# Patient Record
Sex: Female | Born: 1970 | Race: Black or African American | Hispanic: No | Smoking: Never smoker
Health system: Southern US, Community
[De-identification: ages and names within clinical notes are randomized; demographics above are authoritative.]

## PROBLEM LIST (undated history)

## (undated) DIAGNOSIS — Z789 Other specified health status: Secondary | ICD-10-CM

## (undated) DIAGNOSIS — R002 Palpitations: Secondary | ICD-10-CM

## (undated) DIAGNOSIS — I82409 Acute embolism and thrombosis of unspecified deep veins of unspecified lower extremity: Secondary | ICD-10-CM

## (undated) DIAGNOSIS — F32A Depression, unspecified: Secondary | ICD-10-CM

## (undated) HISTORY — DX: Acute embolism and thrombosis of unspecified deep veins of unspecified lower extremity: I82.409

## (undated) HISTORY — DX: Depression, unspecified: F32.A

## (undated) HISTORY — DX: Palpitations: R00.2

---

## 2001-12-03 ENCOUNTER — Emergency Department (HOSPITAL_COMMUNITY): Admission: EM | Admit: 2001-12-03 | Discharge: 2001-12-03 | Payer: Self-pay | Admitting: Emergency Medicine

## 2002-03-12 ENCOUNTER — Other Ambulatory Visit: Admission: RE | Admit: 2002-03-12 | Discharge: 2002-03-12 | Payer: Self-pay | Admitting: Obstetrics and Gynecology

## 2003-05-25 ENCOUNTER — Other Ambulatory Visit: Admission: RE | Admit: 2003-05-25 | Discharge: 2003-05-25 | Payer: Self-pay | Admitting: Obstetrics and Gynecology

## 2004-09-11 ENCOUNTER — Other Ambulatory Visit: Admission: RE | Admit: 2004-09-11 | Discharge: 2004-09-11 | Payer: Self-pay | Admitting: Obstetrics and Gynecology

## 2005-04-09 ENCOUNTER — Inpatient Hospital Stay (HOSPITAL_COMMUNITY): Admission: AD | Admit: 2005-04-09 | Discharge: 2005-04-12 | Payer: Self-pay | Admitting: *Deleted

## 2005-05-23 ENCOUNTER — Encounter: Admission: RE | Admit: 2005-05-23 | Discharge: 2005-06-22 | Payer: Self-pay | Admitting: Obstetrics and Gynecology

## 2005-06-05 ENCOUNTER — Other Ambulatory Visit: Admission: RE | Admit: 2005-06-05 | Discharge: 2005-06-05 | Payer: Self-pay | Admitting: Obstetrics and Gynecology

## 2005-06-23 ENCOUNTER — Encounter: Admission: RE | Admit: 2005-06-23 | Discharge: 2005-07-20 | Payer: Self-pay | Admitting: Obstetrics and Gynecology

## 2005-07-21 ENCOUNTER — Encounter: Admission: RE | Admit: 2005-07-21 | Discharge: 2005-08-20 | Payer: Self-pay | Admitting: Obstetrics and Gynecology

## 2005-08-02 ENCOUNTER — Ambulatory Visit: Payer: Self-pay | Admitting: Internal Medicine

## 2005-08-21 ENCOUNTER — Encounter: Admission: RE | Admit: 2005-08-21 | Discharge: 2005-09-19 | Payer: Self-pay | Admitting: Obstetrics and Gynecology

## 2005-09-20 ENCOUNTER — Encounter: Admission: RE | Admit: 2005-09-20 | Discharge: 2005-10-20 | Payer: Self-pay | Admitting: Obstetrics and Gynecology

## 2005-10-21 ENCOUNTER — Encounter: Admission: RE | Admit: 2005-10-21 | Discharge: 2005-11-19 | Payer: Self-pay | Admitting: Obstetrics and Gynecology

## 2005-11-20 ENCOUNTER — Encounter: Admission: RE | Admit: 2005-11-20 | Discharge: 2005-12-20 | Payer: Self-pay | Admitting: Obstetrics and Gynecology

## 2005-12-21 ENCOUNTER — Encounter: Admission: RE | Admit: 2005-12-21 | Discharge: 2006-01-20 | Payer: Self-pay | Admitting: Obstetrics and Gynecology

## 2006-01-21 ENCOUNTER — Encounter: Admission: RE | Admit: 2006-01-21 | Discharge: 2006-02-19 | Payer: Self-pay | Admitting: Obstetrics and Gynecology

## 2006-02-20 ENCOUNTER — Encounter: Admission: RE | Admit: 2006-02-20 | Discharge: 2006-03-22 | Payer: Self-pay | Admitting: Obstetrics and Gynecology

## 2006-03-23 ENCOUNTER — Encounter: Admission: RE | Admit: 2006-03-23 | Discharge: 2006-04-21 | Payer: Self-pay | Admitting: Obstetrics and Gynecology

## 2006-04-22 ENCOUNTER — Encounter: Admission: RE | Admit: 2006-04-22 | Discharge: 2006-05-22 | Payer: Self-pay | Admitting: Obstetrics and Gynecology

## 2006-05-23 ENCOUNTER — Encounter: Admission: RE | Admit: 2006-05-23 | Discharge: 2006-06-22 | Payer: Self-pay | Admitting: Obstetrics and Gynecology

## 2006-06-20 ENCOUNTER — Inpatient Hospital Stay (HOSPITAL_COMMUNITY): Admission: EM | Admit: 2006-06-20 | Discharge: 2006-06-21 | Payer: Self-pay | Admitting: Emergency Medicine

## 2006-06-20 ENCOUNTER — Ambulatory Visit: Payer: Self-pay | Admitting: Cardiology

## 2006-06-23 ENCOUNTER — Encounter: Admission: RE | Admit: 2006-06-23 | Discharge: 2006-07-21 | Payer: Self-pay | Admitting: Obstetrics and Gynecology

## 2006-06-24 ENCOUNTER — Ambulatory Visit: Payer: Self-pay | Admitting: Internal Medicine

## 2006-07-03 ENCOUNTER — Ambulatory Visit: Payer: Self-pay

## 2006-07-11 ENCOUNTER — Ambulatory Visit: Payer: Self-pay

## 2006-07-15 ENCOUNTER — Ambulatory Visit: Payer: Self-pay | Admitting: Cardiology

## 2006-07-22 ENCOUNTER — Encounter: Admission: RE | Admit: 2006-07-22 | Discharge: 2006-08-21 | Payer: Self-pay | Admitting: Obstetrics and Gynecology

## 2006-08-22 ENCOUNTER — Encounter: Admission: RE | Admit: 2006-08-22 | Discharge: 2006-09-20 | Payer: Self-pay | Admitting: Obstetrics and Gynecology

## 2006-09-21 ENCOUNTER — Encounter: Admission: RE | Admit: 2006-09-21 | Discharge: 2006-10-21 | Payer: Self-pay | Admitting: Obstetrics and Gynecology

## 2007-05-12 ENCOUNTER — Telehealth: Payer: Self-pay | Admitting: Internal Medicine

## 2007-10-13 ENCOUNTER — Encounter: Payer: Self-pay | Admitting: *Deleted

## 2007-10-13 DIAGNOSIS — R5383 Other fatigue: Secondary | ICD-10-CM

## 2007-10-13 DIAGNOSIS — J329 Chronic sinusitis, unspecified: Secondary | ICD-10-CM | POA: Insufficient documentation

## 2007-10-13 DIAGNOSIS — Z862 Personal history of diseases of the blood and blood-forming organs and certain disorders involving the immune mechanism: Secondary | ICD-10-CM | POA: Insufficient documentation

## 2007-10-13 DIAGNOSIS — J309 Allergic rhinitis, unspecified: Secondary | ICD-10-CM | POA: Insufficient documentation

## 2007-10-13 DIAGNOSIS — R5381 Other malaise: Secondary | ICD-10-CM | POA: Insufficient documentation

## 2007-11-16 ENCOUNTER — Ambulatory Visit: Payer: Self-pay | Admitting: Internal Medicine

## 2007-11-16 DIAGNOSIS — M25519 Pain in unspecified shoulder: Secondary | ICD-10-CM | POA: Insufficient documentation

## 2008-05-30 ENCOUNTER — Encounter: Payer: Self-pay | Admitting: Internal Medicine

## 2008-09-21 ENCOUNTER — Ambulatory Visit: Payer: Self-pay | Admitting: Internal Medicine

## 2008-09-21 DIAGNOSIS — F410 Panic disorder [episodic paroxysmal anxiety] without agoraphobia: Secondary | ICD-10-CM | POA: Insufficient documentation

## 2008-09-21 DIAGNOSIS — F32A Depression, unspecified: Secondary | ICD-10-CM | POA: Insufficient documentation

## 2008-09-21 DIAGNOSIS — G4700 Insomnia, unspecified: Secondary | ICD-10-CM | POA: Insufficient documentation

## 2008-09-21 DIAGNOSIS — F341 Dysthymic disorder: Secondary | ICD-10-CM | POA: Insufficient documentation

## 2008-09-21 LAB — CONVERTED CEMR LAB: Vit D, 25-Hydroxy: 45 ng/mL (ref 30–89)

## 2008-09-26 LAB — CONVERTED CEMR LAB
AST: 19 units/L (ref 0–37)
Albumin: 3.8 g/dL (ref 3.5–5.2)
Alkaline Phosphatase: 37 units/L — ABNORMAL LOW (ref 39–117)
BUN: 12 mg/dL (ref 6–23)
Basophils Absolute: 0 10*3/uL (ref 0.0–0.1)
Basophils Relative: 0.5 % (ref 0.0–3.0)
Calcium: 9.3 mg/dL (ref 8.4–10.5)
Chloride: 105 meq/L (ref 96–112)
Creatinine, Ser: 0.6 mg/dL (ref 0.4–1.2)
Eosinophils Absolute: 0.2 10*3/uL (ref 0.0–0.7)
GFR calc non Af Amer: 144.77 mL/min (ref >60)
Lymphocytes Relative: 28.2 % (ref 12.0–46.0)
Lymphs Abs: 2 10*3/uL (ref 0.7–4.0)
MCV: 91 fL (ref 78.0–100.0)
Monocytes Absolute: 0.4 10*3/uL (ref 0.1–1.0)
Monocytes Relative: 5.5 % (ref 3.0–12.0)
Neutrophils Relative %: 63.1 % (ref 43.0–77.0)
Nitrite: NEGATIVE
Potassium: 4 meq/L (ref 3.5–5.1)
RBC: 4.09 M/uL (ref 3.87–5.11)
Sodium: 139 meq/L (ref 135–145)
TSH: 1.46 microintl units/mL (ref 0.35–5.50)
Total Protein: 8.1 g/dL (ref 6.0–8.3)
Urine Glucose: NEGATIVE mg/dL
Urobilinogen, UA: 0.2 (ref 0.0–1.0)
Vitamin B-12: 848 pg/mL (ref 211–911)

## 2008-09-27 ENCOUNTER — Telehealth: Payer: Self-pay | Admitting: Internal Medicine

## 2008-11-07 ENCOUNTER — Ambulatory Visit: Payer: Self-pay | Admitting: Internal Medicine

## 2008-11-09 ENCOUNTER — Telehealth: Payer: Self-pay | Admitting: Internal Medicine

## 2008-11-15 ENCOUNTER — Encounter: Payer: Self-pay | Admitting: Internal Medicine

## 2008-11-17 ENCOUNTER — Telehealth (INDEPENDENT_AMBULATORY_CARE_PROVIDER_SITE_OTHER): Payer: Self-pay | Admitting: *Deleted

## 2008-11-18 ENCOUNTER — Ambulatory Visit: Payer: Self-pay | Admitting: Internal Medicine

## 2008-11-20 DIAGNOSIS — F411 Generalized anxiety disorder: Secondary | ICD-10-CM

## 2008-11-20 DIAGNOSIS — F410 Panic disorder [episodic paroxysmal anxiety] without agoraphobia: Secondary | ICD-10-CM | POA: Insufficient documentation

## 2008-11-22 ENCOUNTER — Encounter: Admission: RE | Admit: 2008-11-22 | Discharge: 2008-11-22 | Payer: Self-pay | Admitting: Obstetrics and Gynecology

## 2008-11-23 ENCOUNTER — Encounter: Payer: Self-pay | Admitting: Internal Medicine

## 2008-11-29 ENCOUNTER — Telehealth: Payer: Self-pay | Admitting: Internal Medicine

## 2008-11-29 ENCOUNTER — Encounter: Payer: Self-pay | Admitting: Internal Medicine

## 2008-11-30 ENCOUNTER — Telehealth: Payer: Self-pay | Admitting: Internal Medicine

## 2008-12-02 ENCOUNTER — Encounter: Payer: Self-pay | Admitting: Internal Medicine

## 2008-12-16 ENCOUNTER — Ambulatory Visit: Payer: Self-pay | Admitting: Internal Medicine

## 2009-06-23 ENCOUNTER — Telehealth: Payer: Self-pay | Admitting: Internal Medicine

## 2009-08-18 ENCOUNTER — Ambulatory Visit: Payer: Self-pay | Admitting: Internal Medicine

## 2009-08-18 LAB — CONVERTED CEMR LAB
Alkaline Phosphatase: 31 units/L — ABNORMAL LOW (ref 39–117)
Basophils Absolute: 0 10*3/uL (ref 0.0–0.1)
Cholesterol: 157 mg/dL (ref 0–200)
Eosinophils Absolute: 0.1 10*3/uL (ref 0.0–0.7)
Eosinophils Relative: 1.6 % (ref 0.0–5.0)
HCT: 35.8 % — ABNORMAL LOW (ref 36.0–46.0)
Hemoglobin: 12.5 g/dL (ref 12.0–15.0)
LDL Cholesterol: 76 mg/dL (ref 0–99)
Lymphocytes Relative: 23.8 % (ref 12.0–46.0)
Lymphs Abs: 1.8 10*3/uL (ref 0.7–4.0)
MCHC: 35 g/dL (ref 30.0–36.0)
MCV: 90.1 fL (ref 78.0–100.0)
Monocytes Absolute: 0.4 10*3/uL (ref 0.1–1.0)
Monocytes Relative: 4.7 % (ref 3.0–12.0)
Neutro Abs: 5.2 10*3/uL (ref 1.4–7.7)
Neutrophils Relative %: 69.6 % (ref 43.0–77.0)
Potassium: 3.8 meq/L (ref 3.5–5.1)
RDW: 12.4 % (ref 11.5–14.6)
TSH: 1.01 microintl units/mL (ref 0.35–5.50)
Total CHOL/HDL Ratio: 3
Total Protein: 8.5 g/dL — ABNORMAL HIGH (ref 6.0–8.3)
WBC: 7.4 10*3/uL (ref 4.5–10.5)

## 2009-08-25 LAB — CONVERTED CEMR LAB
Mumps IgG: 1.57 — ABNORMAL HIGH
Rubeola IgG: 1.6 — ABNORMAL HIGH

## 2009-08-30 ENCOUNTER — Telehealth: Payer: Self-pay | Admitting: Internal Medicine

## 2009-09-05 ENCOUNTER — Ambulatory Visit: Payer: Self-pay | Admitting: Internal Medicine

## 2009-09-19 ENCOUNTER — Ambulatory Visit: Payer: Self-pay | Admitting: Internal Medicine

## 2009-09-21 ENCOUNTER — Encounter: Payer: Self-pay | Admitting: Internal Medicine

## 2009-09-27 ENCOUNTER — Telehealth: Payer: Self-pay | Admitting: Internal Medicine

## 2009-09-27 ENCOUNTER — Encounter: Payer: Self-pay | Admitting: Internal Medicine

## 2010-06-12 NOTE — Assessment & Plan Note (Signed)
Summary: tb skin test per kelly/plot/cd --coming at 8am  Nurse Visit   Allergies: No Known Drug Allergies  Immunizations Administered:  PPD Skin Test:    Vaccine Type: PPD    Site: right forearm    Mfr: Sanofi Pasteur    Dose: 0.1 ml    Route: ID    Given by: Brenton Grills    Exp. Date: 09/06/2011    Lot #: [id number]  PPD Results    Date of reading: 09/07/2009    Results: < 5mm    Interpretation: negative  Orders Added: 1)  TB Skin Test [86580] 2)  Admin 1st Vaccine [82956]

## 2010-06-12 NOTE — Assessment & Plan Note (Signed)
Summary: NEEDS VACCINES FOR SCHOOL/NWS   Vital Signs:  Patient profile:   40 year old female Height:      63 inches Weight:      187 pounds BMI:     33.25 O2 Sat:      98 % on Room air Temp:     98.0 degrees F oral Pulse rate:   70 / minute BP sitting:   110 / 68  (left arm) Cuff size:   regular  Vitals Entered By: Lucious Groves (August 18, 2009 8:04 AM)  O2 Flow:  Room air CC: Needs vaccines for nursing school./kb Is Patient Diabetic? No Pain Assessment Patient in pain? no        CC:  Needs vaccines for nursing school./kb.  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 1 Once Daily 2)  Nasacort Aq 55 Mcg/act  Aers (Triamcinolone Acetonide(Nasal)) .... Use As Directed. 3)  Vitamin D3 1000 Unit  Tabs (Cholecalciferol) .Marland Kitchen.. 1 By Mouth Daily 4)  Loratadine-D 24hr 10-240 Mg Xr24h-Tab (Loratadine-Pseudoephedrine) .Marland Kitchen.. 1 By Mouth Once Daily in Am As Needed Allergy 5)  Yaz 3-0.02 Mg Tabs (Drospirenone-Ethinyl Estradiol) .... One Qd  Allergies (verified): No Known Drug Allergies  Past History:  Past Medical History: Last updated: 11/18/2008 Hx of SINUSITIS (ICD-473.9) PALPITATIONS, HX OF (ICD-V12.50) CARPAL TUNNEL SYNDROME, RIGHT (ICD-354.0) ANEMIA, HX OF (ICD-V12.3) Hx of DEPRESSION (ICD-311) Hx of FATIGUE (ICD-780.79) ALLERGIC RHINITIS (ICD-477.9)   Anxiety Depression  Family History: Last updated: 09/21/2008 No depression  Social History: Last updated: 09/21/2008 Occupation: office Married with 3 children, husband w/bipolar depression Never Smoked Alcohol use-no Drug use-no  Past Surgical History: Denies surgical history  Review of Systems  The patient denies anorexia, fever, weight loss, weight gain, vision loss, decreased hearing, hoarseness, chest pain,  syncope, dyspnea on exertion, peripheral edema, prolonged cough, headaches, hemoptysis, abdominal pain, melena, hematochezia, severe indigestion/heartburn, hematuria, incontinence, genital sores, muscle weakness, suspicious skin lesions, transient blindness, difficulty walking, depression, unusual weight change, abnormal bleeding, enlarged lymph nodes, angioedema, and breast masses.    Physical Exam  General:  Well-developed,well-nourished Head:  Normocephalic and atraumatic without obvious abnormalities. No apparent alopecia or balding. Eyes:  No corneal or conjunctival inflammation noted. EOMI. Perrla.  Nose:  External nasal examination shows no deformity or inflammation. Nasal mucosa are pink and moist without lesions or exudates. Mouth:  Oral mucosa and oropharynx without lesions or exudates.  Teeth in good repair. Lungs:  Normal respiratory effort, chest expands symmetrically. Lungs are clear to auscultation, no crackles or wheezes. Heart:  Normal rate and regular rhythm. S1 and S2 normal without gallop, murmur, click, rub or other extra sounds. Abdomen:  Bowel sounds positive,abdomen soft and non-tender without masses, organomegaly or hernias noted. Msk:  No deformity or scoliosis noted of thoracic or lumbar spine.   Neurologic:  No cranial nerve deficits noted. Station and gait are normal. Plantar reflexes are down-going bilaterally. DTRs are symmetrical throughout. Sensory, motor and coordinative functions appear intact. Skin:  Intact without suspicious lesions or rashes Psych:  Cognition and judgment appear intact. Alert and cooperative with normal attention span and concentration. No apparent delusions, illusions, hallucinations Oriented X3, depressed affect,  slightly anxious.  not suicidal.     Impression & Recommendations:  Problem # 1:  PHYSICAL EXAMINATION (ICD-V70.0) Assessment New Health and age  related issues were discussed. Available screening tests and vaccinations were  discussed as well. Healthy life style including good diet and execise was discussed. GYN q 12 months. PPD will be placed. She seems to be in a good shape. Orders: T-Varicella-Zoster Antibody [id number]) T-Measles (Rubeola) Antibody IgG ([id number]) T-Mumps Virus Antibody, IgG (72536-64403) TLB-Udip ONLY (81003-UDIP) TLB-BMP (Basic Metabolic Panel-BMET) (80048-METABOL) TLB-CBC Platelet - w/Differential (85025-CBCD) TLB-Lipid Panel (80061-LIPID) TLB-TSH (Thyroid Stimulating Hormone) (84443-TSH) TLB-Hepatic/Liver Function Pnl (80076-HEPATIC)  Problem # 2:  ALLERGIC RHINITIS (ICD-477.9) Assessment: Improved  Her updated medication list for this problem includes:    Nasacort Aq 55 Mcg/act Aers (Triamcinolone acetonide(nasal)) ..... Use as directed.  Complete Medication List: 1)  Nasacort Aq 55 Mcg/act Aers (Triamcinolone acetonide(nasal)) .... Use as directed. 2)  Vitamin D3 1000 Unit Tabs (Cholecalciferol) .Marland Kitchen.. 1 by mouth daily 3)  Loratadine-d 24hr 10-240 Mg Xr24h-tab (Loratadine-pseudoephedrine) .Marland Kitchen.. 1 by mouth once daily in am as needed allergy 4)  Yaz 3-0.02 Mg Tabs (Drospirenone-ethinyl estradiol) .... One qd  Other Orders: Tdap => 43yrs IM [phone]) Admin 1st Vaccine (95638) Admin 1st Vaccine [facility]) [phone]) TB Skin Test [phone]) Admin of Any Addtl Vaccine (84166) Admin of Any Addtl Vaccine (State) (06301S) TwinRix 1ml ( Hep A&B Adult dose) (01093) Admin of Any Addtl Vaccine (23557) Admin of Any Addtl Vaccine (State) (32202R)  Patient Instructions: 1)  Please schedule a follow-up appointment in 1 year. Prescriptions: NASACORT AQ 55 MCG/ACT  AERS (TRIAMCINOLONE ACETONIDE(NASAL)) use as directed.  #3 x 1   Entered by:   Lamar Sprinkles, CMA   Authorized by:   Tresa Garter MD   Signed by:   Lamar Sprinkles, CMA on 08/18/2009   Method used:   [facility] to        [facility] [phone]* (retail)       [address]       [place],  [place]  [address]       Ph: [phone]       Fax: [phone]   RxID:   [id number] LORATADINE-D 24HR 10-240 MG XR24H-TAB (LORATADINE-PSEUDOEPHEDRINE) 1 by mouth once daily in am as needed allergy  #90 x 1   Entered by:   Lamar Sprinkles, CMA   Authorized by:   Tresa Garter MD   Signed by:   Lamar Sprinkles, CMA on 08/18/2009   Method used:   Electronically to        [facility] [phone]* (retail)       [address]       [place], [place]  [address]       Ph: [phone]       Fax: [phone]   RxID:   [id number]    Tetanus/Td Vaccine    Vaccine Type: Tdap    Site: left deltoid    Mfr: GlaxoSmithKline    Dose: 0.5 ml    Route: IM    Given by: Lucious Groves    Exp. Date: 08/05/2011    Lot #: [id number]    VIS given: 03/31/07 version given August 18, 2009.  PPD Application    Vaccine Type: PPD    Site: left forearm    Mfr: Sanofi Pasteur    Dose: 0.1 ml    Route: ID    Given by: Lucious Groves    Exp. Date: 09/06/2011    Lot #: [id number]  PPD Results    Date of  reading: 08/21/2009    Results: < 5mm    Interpretation: negative  TwinRix # 1    Vaccine Type: TwinRix    Site: right deltoid    Mfr: GlaxoSmithKline    Dose: 0.5 ml    Route: IM    Given by: Lucious Groves    Exp. Date: 03/16/2011    Lot #: [id number]

## 2010-06-12 NOTE — Assessment & Plan Note (Signed)
Summary: twinrix-lb  Nurse Visit   Vitals Entered By: Lamar Sprinkles, CMA (Sep 19, 2009 3:36 PM)  Allergies: No Known Drug Allergies  Immunizations Administered:  TwinRix # 2:    Vaccine Type: TwinRix    Site: right deltoid    Mfr: GlaxoSmithKline    Dose: 0.5 ml    Route: IM    Given by: Lamar Sprinkles, CMA    Exp. Date: 03/16/2011    Lot #: [id number]    VIS given: 01/29/07 version given Sep 19, 2009.  Orders Added: 1)  TwinRix 1ml ( Hep A&B Adult dose) [90636] 2)  Admin 1st Vaccine [90471]   Orders Added: 1)  TwinRix 1ml ( Hep A&B Adult dose) [90636] 2)  Admin 1st Vaccine [29562]

## 2010-06-12 NOTE — Letter (Signed)
Summary: TB Skin Test  [facility]  [address] [place], [place] [address]   Phone: [phone]  Fax: [phone]          TB Skin Test    [facility]    Date TB Test Placed:   08/18/2009   TB Test Placed by [facility]  Date TB Test Read:  __04/11/2011____    Result ___0____MM  TB Test Read by:  [facility]   Date TB Test Placed:  09/05/2009   TB Test Placed by [facility] Date TB Test Read:  09/07/2009 Result:  0   MM

## 2010-06-12 NOTE — Progress Notes (Signed)
Summary: CALL  Phone Note Call from Patient Call back at Home Phone [phone]   Summary of Call: Patient is requesting a call back.  Initial call taken by: Lamar Sprinkles, CMA,  August 30, 2009 9:48 AM  Follow-up for Phone Call        Spoke w/pt She needs 2nd tb test for school next week and 2nd twinrix after 5/8, transferred to scheduler for apts.  Follow-up by: Lamar Sprinkles, CMA,  August 30, 2009 1:30 PM

## 2010-06-12 NOTE — Miscellaneous (Signed)
Summary: IMMUNIZATION UPDATE  Clinical Lists Changes  Observations: Added new observation of DPT #4: Historical (02/17/1974 11:07) Added new observation of OPV #4: Historical (02/10/1974 11:07) Added new observation of OPV #3: Historical (05/15/1972 11:07) Added new observation of DPT #3: Historical (05/15/1972 11:07) Added new observation of DPT #2: Historical (03/10/1972 11:07) Added new observation of OPV #2: Historical (03/08/1972 11:07) Added new observation of OPV #1: Historical (01/07/1972 11:07) Added new observation of DPT #1: Historical (01/07/1972 11:07)      Immunization History:  DPT Immunization History:    DPT # 1:  historical (01/07/1972)    DPT # 2:  historical (03/10/1972)    DPT # 3:  historical (05/15/1972)    DPT # 4:  historical (02/17/1974)  Polio Immunization History:    Polio # 1:  historical (01/07/1972)    Polio # 2:  historical (03/08/1972)    Polio # 3:  historical (05/15/1972)    Polio # 4:  historical (02/10/1974)

## 2010-06-12 NOTE — Letter (Signed)
Summary: Student Medical Form/[facility]  Student Medical Form/[facility]   Imported By: Sherian Rein 09/29/2009 09:18:00  _____________________________________________________________________  External Attachment:    Type:   Image     Comment:   External Document

## 2010-06-12 NOTE — Progress Notes (Signed)
  Phone Note Call from Patient   Summary of Call: Pt needs documentation of both TB tests w/o other medical info on it. Letter completed, pt aware to pick up.  Initial call taken by: Lamar Sprinkles, CMA,  Sep 27, 2009 4:34 PM

## 2010-06-12 NOTE — Progress Notes (Signed)
Summary: Allegra-D  Phone Note From Pharmacy   Summary of Call: PA request--Allegra-D 24 hr.  The note from pharmacy requests PA, but also notes that the patient would like to try generic alternative. Please advise. Initial call taken by: Lucious Groves,  June 23, 2009 9:23 AM  Follow-up for Phone Call        Can try generic Lorat - D 24h Follow-up by: Tresa Garter MD,  June 23, 2009 12:06 PM  Additional Follow-up for Phone Call Additional follow up Details #1::        pharmacy notified via escribe Additional Follow-up by: Rock Nephew CMA,  June 23, 2009 3:07 PM    New/Updated Medications: LORATADINE-D 24HR 10-240 MG XR24H-TAB (LORATADINE-PSEUDOEPHEDRINE) 1 by mouth once daily in am as needed allergy Prescriptions: LORATADINE-D 24HR 10-240 MG XR24H-TAB (LORATADINE-PSEUDOEPHEDRINE) 1 by mouth once daily in am as needed allergy  #30 x 6   Entered and Authorized by:   Tresa Garter MD   Signed by:   Rock Nephew CMA on 06/23/2009   Method used:   Electronically to        [facility] [phone]* (retail)       [address]       [place], [place]  [address]       Ph: [phone]       Fax: [phone]   RxID:   [id number]

## 2010-09-28 NOTE — H&P (Signed)
Tonya, Watkins               ACCOUNT NO.:  [id number]   MEDICAL RECORD NO.:  [id number]          PATIENT TYPE:  EMS   LOCATION:  MAJO                         FACILITY:  [facility]   PHYSICIAN:  Tonya C. Wall, MD, FACCDATE OF BIRTH:  1971-04-20   DATE OF ADMISSION:  06/20/2006  DATE OF DISCHARGE:                              HISTORY & PHYSICAL   PRIMARY CARE PHYSICIAN:  Dr. Posey Rea.   PATIENT PROFILE:  A 40 year old African American female with no prior  history of coronary disease who presents with tachy palpitations and  dyspnea and shortness of breath,  1. Tachy palpitations.  2. Chest pain, shortness of breath.  3. History of snoring with questionable undiagnosed sleep apnea.  4. Morbid obesity.  5. G3, P3, L3.   HISTORY OF PRESENT ILLNESS:  A 40 year old [place] American female with  no prior cardiac history.  Her husband has noted that she sometimes  stops breathing during the night times several years, and she has a long  history of snoring.  Three times in the past year, she has awakened in  the middle the night with gasping for air and short of breath with  irregular tachy palpitations as well as mild chest pressure lasting  approximately 45 minutes and resolving with continued rest and deep  breathing.  She had such an episode this morning, which prompted her to  call her PCP who then advised that she present to the [facility] ED.  In  the ED, an ECG was performed and showed sinus rhythm with T-wave  inversion in leads II, III, aVF, V3-V4 as well as 0.5 mm ST elevation in  I, aVL and V2.  She has been asymptomatic throughout her stay here in  the emergency room.   ALLERGIES:  NO KNOWN DRUG ALLERGIES.   HOME MEDICATIONS:  None.   FAMILY HISTORY:  Mother is alive and well at age 51.  Father is alive  with history of kidney stones at age 70.  She has a half-sister who had  a cerebral aneurysm and also has a history of hypertension.  She has a  brother who is alive  and well.   SOCIAL HISTORY:  She lives in [place] with her husband and three  children ages 47, 37  and 1.  She works in Clinical biochemist as a Tax adviser.  She denies any tobacco abuse, drug use or exercise.  She will  occasionally have alcohol on beverage, but it is fairly rare.   REVIEW OF SYSTEMS:  Positive for chest pain, shortness of breath, PND  and palpitations.  Otherwise, all systems reviewed and negative.   PHYSICAL EXAMINATION:  VITAL SIGNS:  Temperature 90.4, heart rate 67,  respirations 16, blood pressure 94/64, pulse ox 98% room air.  GENERAL:  Pleasant African American female in no acute distress.  Awake,  alert and oriented x3.  NECK:  Obese with no bruits.  Difficult to assess JVP.  LUNGS:  Respirations regular and unlabored.  CARDIAC:  Regular S1, S2.  No S3, S4, murmurs or rubs.  ABDOMEN: Obese, soft, nontender, nondistended.  Bowel sounds present x4.  EXTREMITIES:  Warm, dry.  No clubbing, cyanosis or edema.  Dorsalis  pedis and posterior tibial pulses 2+ and equal bilaterally.  Chest x-ray  is pending.  EKG shows sinus rhythm with a left axis rate of 73 beats  per minute.  T-wave inversions II, III, aVF, V3-V4.  ST elevation of 0.5  mm in I, AVL and V2.   LABORATORY WORK:  Hemoglobin 13.3, hematocrit 38.2, WBC 7.5, platelets  276.  Sodium 36, potassium 3.6, chloride 100, CO2 27, BUN 11, creatinine  0.72, glucose 75, AST 19, ALT 24, total protein 7.7, albumin 3.8, CK-MB  5.7 and 2.5, troponin-I less than 0.5 x2, D-dimer 0.44.   ASSESSMENT/PLAN:  1. Chest pain and dyspnea.  This occurred in the setting of waking up      gasping for air with associated tachy palpitations.  She is      currently pain free.  She has noted decreased exercise tolerance      over the past year since birth of her now 26-year-old child, but she      notes that she has been fairly deconditioned.  Will plan to admit      and rule out.  If cardiac enzymes are negative, will plan to DC  in      the a.m. with an outpatient Myoview.  2. Tachy palpitations.  This occurred three times in the past year,      always with episodes of waking up and gasping for air.  As above,      if the patient rules out plan to discharge.  Inpatient versus      outpatient 2-D echocardiogram.  Likely to be done as an outpatient.      She will need to be set up for and event recorder.  Will check TFT.  3. History of snoring with questionable sleep apnea.  Based the      patient's story and history of husband noting that she stops      breathing at night as well as the large girth of her neck, it is      likely that she does have sleep apnea.  She saw Dr. Posey Rea and      can likely have outpatient sleep evaluation/referral.  4. Lipid status currently unknown.  Will check lipids.  AST and ALT      are okay.      [name], ANP      [name], MD, [facility]  Electronically Signed    CB/MEDQ  D:  06/20/2006  T:  06/21/2006  Job:  [id number]

## 2010-09-28 NOTE — Assessment & Plan Note (Signed)
[facility]                            CARDIOLOGY OFFICE NOTE   ROGUE, PAUTLER                        MRN:          [id number]  DATE:07/15/2006                            DOB:          10/05/1970    Tonya Watkins returns today after being evaluated for chest pain, tachy-  palpitations and admitted to the hospital for rule out MI.  She ruled  out.  Her D-dimer was negative.   She was set up for an outpatient rest/stress Myoview.  Exercise  tolerance was reduced at 6 minutes 15 seconds.  Her peak heart rate was  164, which is 88% of predicted maximal heart rate.  Her EF was 59%.  There was mild breast attentuation, no sinus scar or ischemia.   Her laboratory data in the hospital was unremarkable.  She had a  remarkably good lipid panel with a total cholesterol of only 134, LDL of  88, but a low HDL of 31.   She has had very little palpitations or chest pain since discharge.  She  is currently only on aspirin 81 mg a day, Nasacort and Allegra.   She has gained over 100 pounds since she had her 1st child.  Her ideal  body weight is probably around 140.   PHYSICAL EXAMINATION:  Her blood pressure is 107/62.  Her pulse is 65  and regular.  HEENT:  Normocephalic, atraumatic.  PERRLA.  Extraocular movements  intact.  Sclerae are clear.  NECK:  Carotid upstroke are equal bilaterally without bruits.  No JVD.  LUNGS:  Clear.  HEART:  Regular rate and rhythm.  EXTREMITIES:  No cyanosis, clubbing or edema.   I spent about 30 minutes talking to Tonya Watkins about therapeutic  lifestyle changes, or TLC.  I have recommended Weight Watchers and an  exercise program of 3 hours per week of brisk walking.   I have told her about the effects of progressive obesity on her risk of  hypertension, diabetes, arthritic and orthopedic issues, and stroke and  heart attacks.   I will see her back on a p.r.n. basis.     Thomas C. Daleen Squibb, MD, [facility]  Electronically  Signed    TCW/MedQ  DD: 07/15/2006  DT: 07/16/2006  Job #: [id number]   cc:   Georgina Quint. Plotnikov, MD

## 2010-09-28 NOTE — Discharge Summary (Signed)
NAMENAWAAL, ALLING               ACCOUNT NO.:  [id number]   MEDICAL RECORD NO.:  [id number]          PATIENT TYPE:  INP   LOCATION:  3738                         FACILITY:  [facility]   PHYSICIAN:  Learta Codding, MD,FACC DATE OF BIRTH:  [birth year]   DATE OF ADMISSION:  06/20/2006  DATE OF DISCHARGE:  06/21/2006                               DISCHARGE SUMMARY   PROCEDURES:  None.   DISCHARGE DIAGNOSIS:  Tachy palpitations.   SECONDARY DIAGNOSES:  1. Chest pain.  2. History of heavy snoring, possible obstructive sleep apnea.  3. Obesity.  4. G3P3L3.   TIME AT DISCHARGE:  25 minutes.   HOSPITAL COURSE:  Ms. Ambrosino was a 40 year old female with no previous  history of coronary artery disease.  She developed tachy palpitations  and chest pain and came to the emergency room for further evaluation and  treatment.   She was in sinus rhythm on admission and maintained this overnight.  She  had some T-wave inversions in leads II, III and aVF, as well as, some  minimal ST changes in leads I, aVL, and V2.  Her cardiac enzymes were  negative for MI; and her EKG was unchanged over night.   Her CKs were slightly elevated at 199 and 231, but her MBs were normal,  troponins were normal; and all point of care markers were normal.  Total  cholesterol was 134 with triglycerides of 73, HDL 31, LDL 88, and a TSH  was within normal limits at 1.299.  A chest x-ray showed no active  cardiopulmonary disease with heart size and mediastinal contours within  normal limits.  A D-dimer was within normal limits at 0.44.   On 06/21/2006 Ms. Haubner symptoms had improved.  She can be followed in  the office with an outpatient cardiac monitor and Myoview.  She needs a  referral for pulmonary evaluation and a sleep study as well.  She was  evaluated by Dr. Andee Lineman and considered stable for discharge on  06/21/2006.   DISCHARGE INSTRUCTIONS:  1. Her activity level is to be increased gradually.  Our office will    call her with her follow-up appointments.  2. She will follow up with Dr. Daleen Squibb after the stress test.  3. She is to follow up with Dr. Posey Rea as needed with consideration      for pulmonary referral for sleep study.   DISCHARGE MEDICATION:  Aspirin 81 mg a day.      Theodore Demark, PA-C      Learta Codding, MD,FACC  Electronically Signed    RB/MEDQ  D:  06/21/2006  T:  06/21/2006  Job:  [id number]   cc:   Georgina Quint. Plotnikov, MD

## 2010-09-28 NOTE — H&P (Signed)
Tonya Watkins, Tonya Watkins               ACCOUNT NO.:  [id number]   MEDICAL RECORD NO.:  [id number]          PATIENT TYPE:  INP   LOCATION:  9171                          FACILITY:  [facility]   PHYSICIAN:  Duke Salvia. Marcelle Overlie, M.D.DATE OF BIRTH:  1970/09/24   DATE OF ADMISSION:  04/09/2005  DATE OF DISCHARGE:                                HISTORY & PHYSICAL   CHIEF COMPLAINT:  Term pregnancy with decreased AFI, for induction.   HISTORY OF PRESENT ILLNESS:  A 40 year old G41, P2-0-1-2, EDD November 29,  who presents for labor induction with BPP today 8/8, EFW 7 pounds 11 ounces,  but AFI is reduced at 6.4, with a reactive NST.  Her GBS is negative.  She  has had an otherwise uncomplicated prenatal course.   Blood type is O positive.  Rubella titer is immune.   PAST MEDICAL HISTORY:  Allergies:  None.   Please see the remainder of the details on her [employer] form.   OBSTETRICAL HISTORY:  One SAB in 1992, a vaginal delivery at term in 82  and 2001 in [place].   PHYSICAL EXAMINATION:  VITAL SIGNS:  Temperature 98.3, blood pressure  100/60.  HEENT:  Unremarkable.  NECK:  Supple without masses.  LUNGS:  Clear.  CARDIOVASCULAR:  Regular rate and rhythm without murmur, gallop, or rub  noted.  BREASTS:  Not examined.  ABDOMEN:  Fundal height was 42.  Fetal heart rate 140.  PELVIC:  Cervix was closed.  EXTREMITIES:  Unremarkable.  NEUROLOGIC:  Unremarkable.   IMPRESSION:  Term intrauterine pregnancy, decreased amniotic fluid index.   PLAN:  Will admit for two-stage labor induction.      Richard M. Marcelle Overlie, M.D.  Electronically Signed     RMH/MEDQ  D:  04/09/2005  T:  04/09/2005  Job:  [id number]

## 2011-12-28 ENCOUNTER — Other Ambulatory Visit: Payer: Self-pay

## 2011-12-28 ENCOUNTER — Emergency Department (HOSPITAL_COMMUNITY): Payer: Self-pay

## 2011-12-28 ENCOUNTER — Emergency Department (HOSPITAL_COMMUNITY)
Admission: EM | Admit: 2011-12-28 | Discharge: 2011-12-28 | Disposition: A | Discharge status: Home / Self Care | Payer: Self-pay | Attending: Emergency Medicine | Admitting: Emergency Medicine

## 2011-12-28 ENCOUNTER — Encounter (HOSPITAL_COMMUNITY): Payer: Self-pay | Admitting: *Deleted

## 2011-12-28 DIAGNOSIS — M79609 Pain in unspecified limb: Secondary | ICD-10-CM | POA: Insufficient documentation

## 2011-12-28 DIAGNOSIS — R42 Dizziness and giddiness: Secondary | ICD-10-CM | POA: Insufficient documentation

## 2011-12-28 DIAGNOSIS — R0602 Shortness of breath: Secondary | ICD-10-CM | POA: Insufficient documentation

## 2011-12-28 DIAGNOSIS — I4949 Other premature depolarization: Secondary | ICD-10-CM | POA: Insufficient documentation

## 2011-12-28 DIAGNOSIS — R079 Chest pain, unspecified: Secondary | ICD-10-CM | POA: Insufficient documentation

## 2011-12-28 DIAGNOSIS — R5381 Other malaise: Secondary | ICD-10-CM | POA: Insufficient documentation

## 2011-12-28 DIAGNOSIS — I493 Ventricular premature depolarization: Secondary | ICD-10-CM

## 2011-12-28 DIAGNOSIS — Z79899 Other long term (current) drug therapy: Secondary | ICD-10-CM | POA: Insufficient documentation

## 2011-12-28 DIAGNOSIS — R5383 Other fatigue: Secondary | ICD-10-CM | POA: Insufficient documentation

## 2011-12-28 DIAGNOSIS — M25569 Pain in unspecified knee: Secondary | ICD-10-CM | POA: Insufficient documentation

## 2011-12-28 DIAGNOSIS — I82409 Acute embolism and thrombosis of unspecified deep veins of unspecified lower extremity: Secondary | ICD-10-CM | POA: Insufficient documentation

## 2011-12-28 LAB — POCT I-STAT TROPONIN I: Troponin i, poc: 0 ng/mL (ref 0.00–0.08)

## 2011-12-28 LAB — CBC
Hemoglobin: 13.2 g/dL (ref 12.0–15.0)
MCH: 30.3 pg (ref 26.0–34.0)
MCV: 87.8 fL (ref 78.0–100.0)
RBC: 4.36 MIL/uL (ref 3.87–5.11)
RDW: 12.5 % (ref 11.5–15.5)
WBC: 5.6 10*3/uL (ref 4.0–10.5)

## 2011-12-28 LAB — BASIC METABOLIC PANEL
BUN: 11 mg/dL (ref 6–23)
CO2: 27 mEq/L (ref 19–32)
Chloride: 103 mEq/L (ref 96–112)
GFR calc Af Amer: >90 mL/min (ref >90)
Glucose, Bld: 85 mg/dL (ref 70–99)

## 2011-12-28 LAB — TSH: TSH: 0.952 u[IU]/mL (ref 0.350–4.500)

## 2011-12-28 MED ORDER — IOHEXOL 350 MG/ML SOLN
100.0000 mL | Freq: Once | INTRAVENOUS | Status: AC | PRN
  Administered 2011-12-28: 100 mL via INTRAVENOUS

## 2011-12-28 MED ORDER — ENOXAPARIN SODIUM 100 MG/ML ~~LOC~~ SOLN
90.0000 mg | SUBCUTANEOUS | Status: AC
  Administered 2011-12-28: 90 mg via SUBCUTANEOUS
  Filled 2011-12-28: qty 1

## 2011-12-28 MED ORDER — ENOXAPARIN SODIUM 100 MG/ML ~~LOC~~ SOLN: 90.0000 mg | Freq: Two times a day (BID) | SUBCUTANEOUS | Status: DC

## 2011-12-28 MED ORDER — ENOXAPARIN (LOVENOX) PATIENT EDUCATION KIT
PACK | Freq: Once | Status: AC
  Administered 2011-12-28: 21:00:00
  Filled 2011-12-28: qty 1

## 2011-12-28 MED ORDER — WARFARIN SODIUM 10 MG PO TABS: 10.0000 mg | ORAL_TABLET | Freq: Every day | ORAL | Status: DC

## 2011-12-28 MED ORDER — ENOXAPARIN SODIUM 100 MG/ML ~~LOC~~ SOLN: 100.0000 mg | Freq: Two times a day (BID) | SUBCUTANEOUS | Status: DC

## 2011-12-28 MED ORDER — WARFARIN SODIUM 10 MG PO TABS
10.0000 mg | ORAL_TABLET | ORAL | Status: AC
  Administered 2011-12-28: 10 mg via ORAL
  Filled 2011-12-28: qty 1

## 2011-12-28 MED ORDER — COUMADIN BOOK
Status: AC
  Administered 2011-12-28: 21:00:00
  Filled 2011-12-28: qty 1

## 2011-12-28 NOTE — ED Notes (Signed)
Pt reports having palpitations x 1 week, feels like heartbeat is irregular and has sob when it occurs. Also having left lower leg pain for extended amount of time, wants to r/o blood clot. And also having headache. ekg done at triage, no acute distress noted at this time.

## 2011-12-28 NOTE — ED Provider Notes (Signed)
History     CSN: [id number]  Arrival date & time 12/28/11  1402   First MD Initiated Contact with Patient 12/28/11 1551      Chief Complaint  Patient presents with  . Chest Pain  . Shortness of Breath    (Consider location/radiation/quality/duration/timing/severity/associated sxs/prior treatment) Patient is a 41 y.o. female presenting with chest pain, shortness of breath, and leg pain. The history is provided by the patient.  Chest Pain The chest pain began 1 - 2 weeks ago. Chest pain occurs frequently. The chest pain is unchanged. Primary symptoms include shortness of breath and dizziness. Pertinent negatives for primary symptoms include no fever, no abdominal pain, no nausea and no vomiting.  Dizziness also occurs with weakness. Dizziness does not occur with nausea or vomiting.  Associated symptoms include weakness. Associated symptoms comments: The patient reports a sensation of "skipping beats" and generalized weakness for over one week. The sensation occurs frequently. No cough, fever, nausea, syncope or near syncope. Her chest pain is associated with the sensation of skipping beats.  She does feel slightly dizzy when she stands, but does not feel she is going to pass out and the feeling resolves. She denies feeling that her heart races, rather it sometimes feels it goes slow. It causes mild shortness of breath. No pleuritic chest pain. Marland Kitchen  Pertinent negatives for past medical history include no CAD, no diabetes and no hypertension.  Her family medical history is significant for hypertension in family.  Pertinent negatives for family medical history include: no CAD in family and no diabetes in family.    Shortness of Breath  Associated symptoms include chest pain and shortness of breath. Pertinent negatives include no fever.  Leg Pain  The incident occurred more than 1 week ago. There was no injury mechanism. The pain is present in the left knee and left leg. The quality of the pain  is described as aching. The pain is moderate. Associated symptoms comments: She denies injury to lower leg and she has not had any swelling. No numbness or tingling. No weakness. She reports she is on birth control and is concerned about a blood clot. She is having SOB and chest pain that is associated with symptoms of irregular heart rhythm as described. No pleuritic pain.Marland Kitchen    History reviewed. No pertinent past medical history.  History reviewed. No pertinent past surgical history.  History reviewed. No pertinent family history.  History  Substance Use Topics  . Smoking status: Not on file  . Smokeless tobacco: Not on file  . Alcohol Use: No    OB History    Grav Para Term Preterm Abortions TAB SAB Ect Mult Living                  Review of Systems  Constitutional: Negative for fever.  Respiratory: Positive for shortness of breath.   Cardiovascular: Positive for chest pain.  Gastrointestinal: Negative for nausea, vomiting and abdominal pain.  Musculoskeletal:       See HPI.  Neurological: Positive for dizziness and weakness.    Allergies  Review of patient's allergies indicates no known allergies.  Home Medications   Current Outpatient Rx  Name Route Sig Dispense Refill  . ASPIRIN 325 MG PO TABS Oral Take 650 mg by mouth 2 (two) times daily as needed. For headaches    . DESOGESTREL-ETHINYL ESTRADIOL 0.15-30 MG-MCG PO TABS Oral Take 1 tablet by mouth daily.    . ADULT MULTIVITAMIN W/MINERALS CH Oral Take 1  tablet by mouth daily.      BP 115/67  Pulse 59  Temp 98.8 F (37.1 C) (Oral)  Resp 20  SpO2 100%  LMP 12/23/2011  Physical Exam  Constitutional: She is oriented to person, place, and time. She appears well-developed and well-nourished. No distress.  HENT:  Head: Normocephalic.  Mouth/Throat: Oropharynx is clear and moist.  Neck: Normal range of motion. Neck supple.  Cardiovascular: Normal rate and regular rhythm.   No murmur heard. Pulmonary/Chest:  Effort normal and breath sounds normal. She has no wheezes. She has no rales. She exhibits no tenderness.  Abdominal: Soft. Bowel sounds are normal. There is no tenderness. There is no rebound and no guarding.  Musculoskeletal: Normal range of motion. She exhibits no edema.       No swelling in either lower extremity. No redness. Tender left calf to posterior knee. Pulses distally are present and equal with right LE. FROM.  Neurological: She is alert and oriented to person, place, and time.  Skin: Skin is warm and dry. No rash noted.  Psychiatric: She has a normal mood and affect.    ED Course  Procedures (including critical care time)   Labs Reviewed  CBC  BASIC METABOLIC PANEL  POCT I-STAT TROPONIN I  TSH  T4, FREE   Results for orders placed during the hospital encounter of 12/28/11  CBC      Component Value Range   WBC 5.6  4.0 - 10.5 K/uL   RBC 4.36  3.87 - 5.11 MIL/uL   Hemoglobin 13.2  12.0 - 15.0 g/dL   HCT 16.1  09.6 - 04.5 %   MCV 87.8  78.0 - 100.0 fL   MCH 30.3  26.0 - 34.0 pg   MCHC 34.5  30.0 - 36.0 g/dL   RDW 40.9  81.1 - 91.4 %   Platelets 289  150 - 400 K/uL  BASIC METABOLIC PANEL      Component Value Range   Sodium 138  135 - 145 mEq/L   Potassium 3.8  3.5 - 5.1 mEq/L   Chloride 103  96 - 112 mEq/L   CO2 27  19 - 32 mEq/L   Glucose, Bld 85  70 - 99 mg/dL   BUN 11  6 - 23 mg/dL   Creatinine, Ser 7.82  0.50 - 1.10 mg/dL   Calcium 9.4  8.4 - 95.6 mg/dL   GFR calc non Af Amer >90  >90 mL/min   GFR calc Af Amer >90  >90 mL/min  POCT I-STAT TROPONIN I      Component Value Range   Troponin i, poc 0.00  0.00 - 0.08 ng/mL   Comment 3             Results for orders placed during the hospital encounter of 12/28/11  CBC      Component Value Range   WBC 5.6  4.0 - 10.5 K/uL   RBC 4.36  3.87 - 5.11 MIL/uL   Hemoglobin 13.2  12.0 - 15.0 g/dL   HCT 21.3  08.6 - 57.8 %   MCV 87.8  78.0 - 100.0 fL   MCH 30.3  26.0 - 34.0 pg   MCHC 34.5  30.0 - 36.0 g/dL    RDW 46.9  62.9 - 52.8 %   Platelets 289  150 - 400 K/uL  BASIC METABOLIC PANEL      Component Value Range   Sodium 138  135 - 145 mEq/L   Potassium 3.8  3.5 - 5.1 mEq/L   Chloride 103  96 - 112 mEq/L   CO2 27  19 - 32 mEq/L   Glucose, Bld 85  70 - 99 mg/dL   BUN 11  6 - 23 mg/dL   Creatinine, Ser 1.61  0.50 - 1.10 mg/dL   Calcium 9.4  8.4 - 09.6 mg/dL   GFR calc non Af Amer >90  >90 mL/min   GFR calc Af Amer >90  >90 mL/min  POCT I-STAT TROPONIN I      Component Value Range   Troponin i, poc 0.00  0.00 - 0.08 ng/mL   Comment 3             Date: 12/28/2011  Rate: 68  Rhythm: normal sinus rhythm  QRS Axis: normal  Intervals: normal  ST/T Wave abnormalities: normal  Conduction Disutrbances:none  Narrative Interpretation:   Old EKG Reviewed: unchanged ** Trigeminy on the monitor at intervals. NSR on EKG  Dg Chest 2 View  12/28/2011  *RADIOLOGY REPORT*  Clinical Data: Shortness of breath, cough and chest pain.  CHEST - 2 VIEW  Comparison: PA and lateral chest 06/20/2006.  Findings: Lungs clear.  Heart size is normal.  No pneumothorax or effusion.  IMPRESSION: Negative chest.  Original Report Authenticated By: Bernadene Bell. Maricela Curet, M.D.     No diagnosis found.  1. Arrythmia 2. DVT   MDM  The patient is ambulated and does well. No pre-syncopal symptoms. She reports mild dizziness but is steady and without ataxia. Heart rate remains in the 60's. Lab studies normal. She states she has been having a soreness in her left neck for sometime, worse with movement, no worse with current episodes of arrythmia.   Doppler performed on the left calf is positive for DVT. CT angio chest is negative. Patient given Lovenox injection in ED, the education kit, a starting dose of Coumadin and instruction for home Lovenox injections. Case Mgr consulted on behalf of patient who is concerned with not having insurance and Lovenox for 3 days provided through hospital pharmacy. She will see Dr. Posey Rea  in 3 days for recheck.        Rodena Medin, PA-C 12/28/11 2119

## 2011-12-28 NOTE — ED Notes (Signed)
Patient transported to CT 

## 2011-12-28 NOTE — Consult Note (Addendum)
ANTICOAGULATION CONSULT NOTE - Initial Consult  Pharmacy Consult for Lovenox Indication: DVT  No Known Allergies  Patient Measurements: Weight: 195 lb (88.451 kg)  Vital Signs: Temp: 98.8 F (37.1 C) (08/17 1531) Temp src: Oral (08/17 1531) BP: 119/68 mmHg (08/17 1807) Pulse Rate: 54  (08/17 1807)  Labs:  Basename 12/28/11 1414  HGB 13.2  HCT 38.3  PLT 289  APTT --  LABPROT --  INR --  HEPARINUNFRC --  CREATININE 0.74  CKTOTAL --  CKMB --  TROPONINI --    The CrCl is unknown because both a height and weight (above a minimum accepted value) are required for this calculation.   Medical History: History reviewed. No pertinent past medical history.  Assessment: 41yof presents to ED with left leg pain and palpitations with SOB x 1 week. She was found to have a DVT in her left peroneal vein. CT angio pending to r/o PE. She will begin full dose lovenox. Renal function and CBC are wnl.   Goal of Therapy:  Anti-Xa level 0.6-1.2 units/ml 4hrs after LMWH dose given Monitor platelets by anticoagulation protocol: Yes   Plan:  1) Lovenox 90mg  sq q12 2) CBC q72h while on full dose lovenox  Fredrik Rigger 12/28/2011,6:41 PM  Pharmacy now asked to dose coumadin for this patient. CT angio negative for PE so she is going home. Coumadin score=8.   Plan: 1) Coumadin 10mg  po daily 2) Coumadin education  Gentry Roch 12/28/11, 8:09pm

## 2011-12-28 NOTE — ED Provider Notes (Signed)
Medical screening examination/treatment/procedure(s) were performed by non-physician practitioner and as supervising physician I was immediately available for consultation/collaboration.    Nelia Shi, MD 12/28/11 2132

## 2011-12-28 NOTE — ED Notes (Signed)
D/c instructions reviewed w/ pt and family - pt and family deny any further questions or concerns at present. Rx given x2  

## 2011-12-28 NOTE — Progress Notes (Signed)
VASCULAR LAB PRELIMINARY  PRELIMINARY  PRELIMINARY  PRELIMINARY  Bilateral lower extremity venous Dopplers completed.    Preliminary report:  There is acute occlusive DVT noted throughout the left peroneal vein.      Mega Kinkade, 12/28/2011, 6:16 PM

## 2011-12-28 NOTE — ED Notes (Signed)
Patient is resting comfortably. 

## 2011-12-28 NOTE — ED Notes (Signed)
Pt voided

## 2011-12-28 NOTE — ED Notes (Signed)
Snack given, family at bedside, no needs expressed

## 2012-01-01 ENCOUNTER — Other Ambulatory Visit (INDEPENDENT_AMBULATORY_CARE_PROVIDER_SITE_OTHER): Payer: Self-pay

## 2012-01-01 ENCOUNTER — Ambulatory Visit (INDEPENDENT_AMBULATORY_CARE_PROVIDER_SITE_OTHER): Payer: Self-pay | Admitting: Internal Medicine

## 2012-01-01 ENCOUNTER — Encounter: Payer: Self-pay | Admitting: Internal Medicine

## 2012-01-01 ENCOUNTER — Telehealth: Payer: Self-pay | Admitting: Internal Medicine

## 2012-01-01 VITALS — BP 128/80 | HR 80 | Temp 97.8°F | Resp 16 | Ht 63.0 in | Wt 200.0 lb

## 2012-01-01 DIAGNOSIS — Z5181 Encounter for therapeutic drug level monitoring: Secondary | ICD-10-CM

## 2012-01-01 DIAGNOSIS — Z7901 Long term (current) use of anticoagulants: Secondary | ICD-10-CM

## 2012-01-01 DIAGNOSIS — R002 Palpitations: Secondary | ICD-10-CM

## 2012-01-01 DIAGNOSIS — I82409 Acute embolism and thrombosis of unspecified deep veins of unspecified lower extremity: Secondary | ICD-10-CM

## 2012-01-01 DIAGNOSIS — R599 Enlarged lymph nodes, unspecified: Secondary | ICD-10-CM

## 2012-01-01 DIAGNOSIS — R590 Localized enlarged lymph nodes: Secondary | ICD-10-CM

## 2012-01-01 MED ORDER — METOPROLOL TARTRATE 25 MG PO TABS: 25.0000 mg | ORAL_TABLET | Freq: Two times a day (BID) | ORAL | Status: DC

## 2012-01-01 MED ORDER — WARFARIN SODIUM 5 MG PO TABS: ORAL_TABLET | ORAL | Status: DC

## 2012-01-01 NOTE — Progress Notes (Signed)
  Subjective:    Patient ID: Tonya Watkins, female    DOB: Sep 29, 1970, 41 y.o.   MRN: [id number]  HPI F/u ER for palpitations and DVT  LLE last Sat (she was on BCP). C/o PVC on heart monitor   Review of Systems  Constitutional: Negative for chills, activity change, appetite change, fatigue and unexpected weight change.  HENT: Negative for congestion, mouth sores and sinus pressure.   Eyes: Negative for visual disturbance.  Respiratory: Negative for cough and chest tightness.   Gastrointestinal: Negative for nausea and abdominal pain.  Genitourinary: Negative for frequency, difficulty urinating and vaginal pain.  Musculoskeletal: Negative for back pain and gait problem.  Skin: Negative for pallor and rash.  Neurological: Negative for dizziness, tremors, weakness, numbness and headaches.  Psychiatric/Behavioral: Negative for confusion and disturbed wake/sleep cycle.       Objective:   Physical Exam  Constitutional: She appears well-developed. No distress.  HENT:  Head: Normocephalic.  Right Ear: External ear normal.  Left Ear: External ear normal.  Nose: Nose normal.  Mouth/Throat: Oropharynx is clear and moist.  Eyes: Conjunctivae normal are normal. Pupils are equal, round, and reactive to light. Right eye exhibits no discharge. Left eye exhibits no discharge.  Neck: Normal range of motion. Neck supple. No JVD present. No tracheal deviation present. No thyromegaly present.  Cardiovascular: Normal rate, regular rhythm and normal heart sounds.   Pulmonary/Chest: No stridor. No respiratory distress. She has no wheezes.  Abdominal: Soft. Bowel sounds are normal. She exhibits no distension and no mass. There is no tenderness. There is no rebound and no guarding.  Musculoskeletal: She exhibits no edema and no tenderness.  Lymphadenopathy:    She has no cervical adenopathy.  Neurological: She displays normal reflexes. No cranial nerve deficit. She exhibits normal muscle tone.  Coordination normal.  Skin: No rash noted. No erythema.  Psychiatric: She has a normal mood and affect. Her behavior is normal. Judgment and thought content normal.          Assessment & Plan:

## 2012-01-01 NOTE — Care Management Note (Unsigned)
    Page 1 of 1   01/01/2012     10:39:44 AM   CARE MANAGEMENT NOTE 01/01/2012  Patient:  MALENI, SEYER   Account Number:  [id number]  Date Initiated:  12/28/2011  Documentation initiated by:  Walterine Amodei  Subjective/Objective Assessment:     Action/Plan:   Anticipated DC Date:     Anticipated DC Plan:        DC Planning Services  Medication Assistance  CM consult      Choice offered to / List presented to:             Status of service:   Medicare Important Message given?   (If response is "NO", the following Medicare IM given date fields will be blank) Date Medicare IM given:   Date Additional Medicare IM given:    Discharge Disposition:    Per UR Regulation:    If discussed at Long Length of Stay Meetings, dates discussed:    Comments:  On 12/28/11 @ 2100 notified by [facility] ED MD that patient needed medication assistance.  Verified the patient was a self pay and eligible for the zz fund per pharmacy.  Prescription sent to pharmacy.    Lourdes Sledge     [phone]

## 2012-01-01 NOTE — Assessment & Plan Note (Signed)
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

## 2012-01-01 NOTE — Assessment & Plan Note (Signed)
B -- 12/28/11 chest CT: IMPRESSION:  No evidence of pulmonary embolus.  Borderline and mildly enlarged bilateral axillary and subpectoral  lymph nodes, left greater than right. While these may be reactive,  I cannot be completely exclude neoplastic process such as lymphoma.  Recommend follow up with repeat CT in the 3-6 months or further  valuation with PET CT.  Original Report Authenticated By: Cyndie Chime, M.D.

## 2012-01-01 NOTE — Telephone Encounter (Signed)
Tonya Watkins , please, inform the patient: INR is 1.5. Take Coumadin as before 10 mg. Take Xarelto samples bid.Recheck INR in 2 d Thank you !

## 2012-01-01 NOTE — Assessment & Plan Note (Signed)
LLE popleteal/peroneal  Doppler 12/28/11: Summary:  - Findings consistent with occlusive acute deep vein thrombosisthroughout the peroneal vein of theleft lower extremity. There appears to be soft thrombus forming in the distal popliteal vein, as well. - No evidence of Baker's cyst on the right or left. Other specific details can be found in the table(s) above. Prepared and Electronically Authenticated by  Sherren Kerns

## 2012-01-02 NOTE — Telephone Encounter (Signed)
Left detailed mess informing pt of below.  

## 2012-01-03 ENCOUNTER — Other Ambulatory Visit (INDEPENDENT_AMBULATORY_CARE_PROVIDER_SITE_OTHER): Payer: Self-pay

## 2012-01-03 DIAGNOSIS — I82409 Acute embolism and thrombosis of unspecified deep veins of unspecified lower extremity: Secondary | ICD-10-CM

## 2012-01-03 DIAGNOSIS — Z5181 Encounter for therapeutic drug level monitoring: Secondary | ICD-10-CM

## 2012-01-03 LAB — PROTIME-INR: INR: 2.7 ratio — ABNORMAL HIGH (ref 0.8–1.0)

## 2012-01-05 ENCOUNTER — Telehealth: Payer: Self-pay | Admitting: Internal Medicine

## 2012-01-05 DIAGNOSIS — I82409 Acute embolism and thrombosis of unspecified deep veins of unspecified lower extremity: Secondary | ICD-10-CM

## 2012-01-05 NOTE — Telephone Encounter (Signed)
Tonya Watkins , please, inform the patient: INR is OK. Take Coumadin as before. Recheck INR in 1 week Thank you !

## 2012-01-07 ENCOUNTER — Telehealth: Payer: Self-pay | Admitting: *Deleted

## 2012-01-07 ENCOUNTER — Other Ambulatory Visit: Payer: Self-pay

## 2012-01-07 MED ORDER — WARFARIN SODIUM 5 MG PO TABS: ORAL_TABLET | ORAL | Status: DC

## 2012-01-07 NOTE — Telephone Encounter (Signed)
Pt advised in detail via mobile VM

## 2012-01-07 NOTE — Telephone Encounter (Signed)
Pt called back wanted to clarify if she needed to continue taking 10 mg on her coumadin. Per md should continue taking same dose & recheck INR in a week. Inform pt coumadin 5 mg was called into pharmacy, but she needed to take 2.../LMB

## 2012-01-16 ENCOUNTER — Other Ambulatory Visit (INDEPENDENT_AMBULATORY_CARE_PROVIDER_SITE_OTHER): Payer: Self-pay

## 2012-01-16 DIAGNOSIS — I82409 Acute embolism and thrombosis of unspecified deep veins of unspecified lower extremity: Secondary | ICD-10-CM

## 2012-01-24 ENCOUNTER — Telehealth: Payer: Self-pay | Admitting: Internal Medicine

## 2012-01-24 NOTE — Telephone Encounter (Signed)
Cindy, Please advise pt on below. Thanks

## 2012-01-24 NOTE — Telephone Encounter (Signed)
Patient called to review INR results from 9/5.  Patient will be coming to coumadin clinic here at [facility] on Monday 9-16 to be re-checked and dosed.

## 2012-01-24 NOTE — Telephone Encounter (Signed)
The pt called the triage line hoping to get her INR results.  Her callback number is [phone]. Thanks!

## 2012-01-27 ENCOUNTER — Ambulatory Visit (INDEPENDENT_AMBULATORY_CARE_PROVIDER_SITE_OTHER): Payer: Self-pay | Admitting: General Practice

## 2012-01-27 DIAGNOSIS — I82409 Acute embolism and thrombosis of unspecified deep veins of unspecified lower extremity: Secondary | ICD-10-CM

## 2012-01-27 LAB — POCT INR: INR: 1.8

## 2012-02-06 ENCOUNTER — Ambulatory Visit (INDEPENDENT_AMBULATORY_CARE_PROVIDER_SITE_OTHER): Payer: Self-pay | Admitting: General Practice

## 2012-02-06 DIAGNOSIS — I82409 Acute embolism and thrombosis of unspecified deep veins of unspecified lower extremity: Secondary | ICD-10-CM

## 2012-02-06 LAB — POCT INR: INR: 1.8

## 2012-02-20 ENCOUNTER — Telehealth: Payer: Self-pay | Admitting: General Practice

## 2012-02-20 NOTE — Telephone Encounter (Signed)
Spoke with patient and re-scheduled appointment to check INR.

## 2012-02-24 ENCOUNTER — Ambulatory Visit (INDEPENDENT_AMBULATORY_CARE_PROVIDER_SITE_OTHER): Payer: BC Managed Care – PPO | Admitting: General Practice

## 2012-02-24 DIAGNOSIS — Z7901 Long term (current) use of anticoagulants: Secondary | ICD-10-CM

## 2012-02-24 DIAGNOSIS — I82409 Acute embolism and thrombosis of unspecified deep veins of unspecified lower extremity: Secondary | ICD-10-CM

## 2012-03-09 ENCOUNTER — Encounter: Payer: Self-pay | Admitting: Internal Medicine

## 2012-03-09 ENCOUNTER — Ambulatory Visit (INDEPENDENT_AMBULATORY_CARE_PROVIDER_SITE_OTHER): Payer: BC Managed Care – PPO | Admitting: General Practice

## 2012-03-09 ENCOUNTER — Ambulatory Visit (INDEPENDENT_AMBULATORY_CARE_PROVIDER_SITE_OTHER): Payer: BC Managed Care – PPO | Admitting: Internal Medicine

## 2012-03-09 VITALS — BP 100/60 | HR 80 | Temp 98.3°F | Resp 16 | Wt 208.0 lb

## 2012-03-09 DIAGNOSIS — I82409 Acute embolism and thrombosis of unspecified deep veins of unspecified lower extremity: Secondary | ICD-10-CM

## 2012-03-09 DIAGNOSIS — F411 Generalized anxiety disorder: Secondary | ICD-10-CM

## 2012-03-09 DIAGNOSIS — L309 Dermatitis, unspecified: Secondary | ICD-10-CM

## 2012-03-09 DIAGNOSIS — Z7901 Long term (current) use of anticoagulants: Secondary | ICD-10-CM

## 2012-03-09 DIAGNOSIS — F329 Major depressive disorder, single episode, unspecified: Secondary | ICD-10-CM

## 2012-03-09 DIAGNOSIS — Z23 Encounter for immunization: Secondary | ICD-10-CM

## 2012-03-09 DIAGNOSIS — R002 Palpitations: Secondary | ICD-10-CM

## 2012-03-09 MED ORDER — VASCULERA PO TABS: 1.0000 | ORAL_TABLET | Freq: Two times a day (BID) | ORAL | Status: DC

## 2012-03-09 MED ORDER — TRIAMCINOLONE ACETONIDE 0.5 % EX CREA: TOPICAL_CREAM | Freq: Three times a day (TID) | CUTANEOUS | Status: DC

## 2012-03-09 NOTE — Assessment & Plan Note (Signed)
Doing better.   

## 2012-03-09 NOTE — Assessment & Plan Note (Addendum)
Continue with current prescription therapy as reflected on the Med list. Will add Vaculera bid

## 2012-03-09 NOTE — Assessment & Plan Note (Signed)
Better  

## 2012-03-09 NOTE — Assessment & Plan Note (Signed)
Triamc prn 

## 2012-03-09 NOTE — Progress Notes (Signed)
  Subjective:    Patient ID: Tonya Watkins, female    DOB: 1970/11/08, 41 y.o.   MRN: [id number]  HPI  F/u ER for palpitations and DVT  LLE last Sat (she was on BCP). C/o occ pain in LLE C/o PVC on heart monitor - some better  Wt Readings from Last 3 Encounters:  03/09/12 208 lb (94.348 kg)  01/01/12 200 lb (90.719 kg)  12/28/11 195 lb (88.451 kg)   BP Readings from Last 3 Encounters:  03/09/12 100/60  01/01/12 128/80  12/28/11 108/59      Review of Systems  Constitutional: Negative for chills, activity change, appetite change, fatigue and unexpected weight change.  HENT: Negative for congestion, mouth sores and sinus pressure.   Eyes: Negative for visual disturbance.  Respiratory: Negative for cough and chest tightness.   Gastrointestinal: Negative for nausea and abdominal pain.  Genitourinary: Negative for frequency, difficulty urinating and vaginal pain.  Musculoskeletal: Negative for back pain and gait problem.  Skin: Negative for pallor and rash.  Neurological: Negative for dizziness, tremors, weakness, numbness and headaches.  Psychiatric/Behavioral: Negative for confusion and disturbed wake/sleep cycle.       Objective:   Physical Exam  Constitutional: She appears well-developed. No distress.  HENT:  Head: Normocephalic.  Right Ear: External ear normal.  Left Ear: External ear normal.  Nose: Nose normal.  Mouth/Throat: Oropharynx is clear and moist.  Eyes: Conjunctivae normal are normal. Pupils are equal, round, and reactive to light. Right eye exhibits no discharge. Left eye exhibits no discharge.  Neck: Normal range of motion. Neck supple. No JVD present. No tracheal deviation present. No thyromegaly present.  Cardiovascular: Normal rate, regular rhythm and normal heart sounds.   Pulmonary/Chest: No stridor. No respiratory distress. She has no wheezes.  Abdominal: Soft. Bowel sounds are normal. She exhibits no distension and no mass. There is no tenderness.  There is no rebound and no guarding.  Musculoskeletal: She exhibits no edema and no tenderness.  Lymphadenopathy:    She has no cervical adenopathy.  Neurological: She displays normal reflexes. No cranial nerve deficit. She exhibits normal muscle tone. Coordination normal.  Skin: No rash noted. No erythema.  Psychiatric: She has a normal mood and affect. Her behavior is normal. Judgment and thought content normal.  No edema Eczema on hands - B wrists  Lab Results  Component Value Date   WBC 5.6 12/28/2011   HGB 13.2 12/28/2011   HCT 38.3 12/28/2011   PLT 289 12/28/2011   GLUCOSE 85 12/28/2011   CHOL 157 08/18/2009   TRIG 116.0 08/18/2009   HDL 57.70 08/18/2009   LDLCALC 76 08/18/2009   ALT 21 08/18/2009   AST 18 08/18/2009   NA 138 12/28/2011   K 3.8 12/28/2011   CL 103 12/28/2011   CREATININE 0.74 12/28/2011   BUN 11 12/28/2011   CO2 27 12/28/2011   TSH 0.952 12/28/2011   INR 1.8 02/24/2012         Assessment & Plan:

## 2012-03-09 NOTE — Assessment & Plan Note (Signed)
Doing well 

## 2012-03-20 ENCOUNTER — Ambulatory Visit (INDEPENDENT_AMBULATORY_CARE_PROVIDER_SITE_OTHER): Payer: BC Managed Care – PPO | Admitting: General Practice

## 2012-03-20 DIAGNOSIS — Z7901 Long term (current) use of anticoagulants: Secondary | ICD-10-CM

## 2012-03-20 DIAGNOSIS — I82409 Acute embolism and thrombosis of unspecified deep veins of unspecified lower extremity: Secondary | ICD-10-CM

## 2012-03-20 LAB — POCT INR: INR: 1.9

## 2012-03-31 ENCOUNTER — Telehealth: Payer: Self-pay

## 2012-03-31 NOTE — Telephone Encounter (Signed)
A user error has taken place: encounter opened in error, closed for administrative reasons.

## 2012-04-08 ENCOUNTER — Ambulatory Visit (INDEPENDENT_AMBULATORY_CARE_PROVIDER_SITE_OTHER): Payer: BC Managed Care – PPO | Admitting: General Practice

## 2012-04-08 DIAGNOSIS — I82409 Acute embolism and thrombosis of unspecified deep veins of unspecified lower extremity: Secondary | ICD-10-CM

## 2012-04-08 DIAGNOSIS — Z7901 Long term (current) use of anticoagulants: Secondary | ICD-10-CM

## 2012-04-25 ENCOUNTER — Other Ambulatory Visit: Payer: Self-pay | Admitting: Internal Medicine

## 2012-04-27 ENCOUNTER — Other Ambulatory Visit: Payer: Self-pay | Admitting: General Practice

## 2012-04-27 MED ORDER — WARFARIN SODIUM 5 MG PO TABS: ORAL_TABLET | ORAL | Status: DC

## 2012-04-28 ENCOUNTER — Ambulatory Visit (INDEPENDENT_AMBULATORY_CARE_PROVIDER_SITE_OTHER): Payer: BC Managed Care – PPO | Admitting: General Practice

## 2012-04-28 DIAGNOSIS — I82409 Acute embolism and thrombosis of unspecified deep veins of unspecified lower extremity: Secondary | ICD-10-CM

## 2012-04-28 DIAGNOSIS — Z7901 Long term (current) use of anticoagulants: Secondary | ICD-10-CM

## 2012-05-27 ENCOUNTER — Ambulatory Visit (INDEPENDENT_AMBULATORY_CARE_PROVIDER_SITE_OTHER): Payer: BC Managed Care – PPO | Admitting: General Practice

## 2012-05-27 DIAGNOSIS — Z7901 Long term (current) use of anticoagulants: Secondary | ICD-10-CM

## 2012-05-27 DIAGNOSIS — I82409 Acute embolism and thrombosis of unspecified deep veins of unspecified lower extremity: Secondary | ICD-10-CM

## 2012-06-05 ENCOUNTER — Ambulatory Visit (INDEPENDENT_AMBULATORY_CARE_PROVIDER_SITE_OTHER): Payer: BC Managed Care – PPO | Admitting: General Practice

## 2012-06-05 DIAGNOSIS — Z7901 Long term (current) use of anticoagulants: Secondary | ICD-10-CM

## 2012-06-05 DIAGNOSIS — I82409 Acute embolism and thrombosis of unspecified deep veins of unspecified lower extremity: Secondary | ICD-10-CM

## 2012-06-05 LAB — POCT INR: INR: 2

## 2012-07-03 ENCOUNTER — Other Ambulatory Visit (INDEPENDENT_AMBULATORY_CARE_PROVIDER_SITE_OTHER): Payer: BC Managed Care – PPO

## 2012-07-03 ENCOUNTER — Ambulatory Visit (INDEPENDENT_AMBULATORY_CARE_PROVIDER_SITE_OTHER): Payer: BC Managed Care – PPO | Admitting: Internal Medicine

## 2012-07-03 ENCOUNTER — Encounter: Payer: Self-pay | Admitting: Internal Medicine

## 2012-07-03 ENCOUNTER — Ambulatory Visit (INDEPENDENT_AMBULATORY_CARE_PROVIDER_SITE_OTHER): Payer: BC Managed Care – PPO | Admitting: General Practice

## 2012-07-03 VITALS — BP 120/62 | HR 68 | Temp 98.7°F | Resp 16 | Wt 200.0 lb

## 2012-07-03 DIAGNOSIS — R5381 Other malaise: Secondary | ICD-10-CM

## 2012-07-03 DIAGNOSIS — R0609 Other forms of dyspnea: Secondary | ICD-10-CM

## 2012-07-03 DIAGNOSIS — F341 Dysthymic disorder: Secondary | ICD-10-CM

## 2012-07-03 DIAGNOSIS — I82409 Acute embolism and thrombosis of unspecified deep veins of unspecified lower extremity: Secondary | ICD-10-CM

## 2012-07-03 DIAGNOSIS — Z7901 Long term (current) use of anticoagulants: Secondary | ICD-10-CM

## 2012-07-03 DIAGNOSIS — R0683 Snoring: Secondary | ICD-10-CM

## 2012-07-03 DIAGNOSIS — R5383 Other fatigue: Secondary | ICD-10-CM

## 2012-07-03 DIAGNOSIS — R0989 Other specified symptoms and signs involving the circulatory and respiratory systems: Secondary | ICD-10-CM

## 2012-07-03 LAB — CBC WITH DIFFERENTIAL/PLATELET
Basophils Relative: 0.4 % (ref 0.0–3.0)
Eosinophils Relative: 1.2 % (ref 0.0–5.0)
HCT: 37.1 % (ref 36.0–46.0)
Lymphs Abs: 2.3 10*3/uL (ref 0.7–4.0)
MCV: 90.3 fl (ref 78.0–100.0)
Monocytes Absolute: 0.9 10*3/uL (ref 0.1–1.0)
Neutro Abs: 5.4 10*3/uL (ref 1.4–7.7)
Platelets: 241 10*3/uL (ref 150.0–400.0)
RBC: 4.11 Mil/uL (ref 3.87–5.11)
WBC: 8.7 10*3/uL (ref 4.5–10.5)

## 2012-07-03 LAB — BASIC METABOLIC PANEL
BUN: 10 mg/dL (ref 6–23)
Chloride: 108 mEq/L (ref 96–112)
Potassium: 4.6 mEq/L (ref 3.5–5.1)

## 2012-07-03 LAB — VITAMIN B12: Vitamin B-12: >1500 pg/mL — ABNORMAL HIGH (ref 211–911)

## 2012-07-03 LAB — TSH: TSH: 0.83 u[IU]/mL (ref 0.35–5.50)

## 2012-07-03 LAB — HEPATIC FUNCTION PANEL
ALT: 20 U/L (ref 0–35)
Albumin: 4 g/dL (ref 3.5–5.2)
Total Protein: 7.5 g/dL (ref 6.0–8.3)

## 2012-07-03 NOTE — Assessment & Plan Note (Addendum)
LLE popleteal/peroneal - likely BCP related 9/13  On IUD now - she was bleeding for a while - not now If she re-bleeds - d/c coumadin

## 2012-07-03 NOTE — Progress Notes (Signed)
   Subjective:    HPI  F/u ER for palpitations and DVT  LLE last Sat (she was on BCP). C/o occ pain in LLE C/o PVC on heart monitor - some better  Wt Readings from Last 3 Encounters:  07/03/12 200 lb (90.719 kg)  03/09/12 208 lb (94.348 kg)  01/01/12 200 lb (90.719 kg)   BP Readings from Last 3 Encounters:  07/03/12 120/62  03/09/12 100/60  01/01/12 128/80      Review of Systems  Constitutional: Negative for chills, activity change, appetite change, fatigue and unexpected weight change.  HENT: Negative for congestion, mouth sores and sinus pressure.   Eyes: Negative for visual disturbance.  Respiratory: Negative for cough and chest tightness.   Gastrointestinal: Negative for nausea and abdominal pain.  Genitourinary: Negative for frequency, difficulty urinating and vaginal pain.  Musculoskeletal: Negative for back pain and gait problem.  Skin: Negative for pallor and rash.  Neurological: Negative for dizziness, tremors, weakness, numbness and headaches.  Psychiatric/Behavioral: Negative for confusion and sleep disturbance.       Objective:   Physical Exam  Constitutional: She appears well-developed. No distress.  HENT:  Head: Normocephalic.  Right Ear: External ear normal.  Left Ear: External ear normal.  Nose: Nose normal.  Mouth/Throat: Oropharynx is clear and moist.  Eyes: Conjunctivae are normal. Pupils are equal, round, and reactive to light. Right eye exhibits no discharge. Left eye exhibits no discharge.  Neck: Normal range of motion. Neck supple. No JVD present. No tracheal deviation present. No thyromegaly present.  Cardiovascular: Normal rate, regular rhythm and normal heart sounds.   Pulmonary/Chest: No stridor. No respiratory distress. She has no wheezes.  Abdominal: Soft. Bowel sounds are normal. She exhibits no distension and no mass. There is no tenderness. There is no rebound and no guarding.  Musculoskeletal: She exhibits no edema and no tenderness.   Lymphadenopathy:    She has no cervical adenopathy.  Neurological: She displays normal reflexes. No cranial nerve deficit. She exhibits normal muscle tone. Coordination normal.  Skin: No rash noted. No erythema.  Psychiatric: She has a normal mood and affect. Her behavior is normal. Judgment and thought content normal.  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

## 2012-07-03 NOTE — Assessment & Plan Note (Signed)
Discussed snoring R/o OSA

## 2012-07-03 NOTE — Assessment & Plan Note (Signed)
Better  

## 2012-08-07 ENCOUNTER — Telehealth: Payer: Self-pay | Admitting: Internal Medicine

## 2012-08-07 DIAGNOSIS — R0683 Snoring: Secondary | ICD-10-CM

## 2012-08-07 NOTE — Telephone Encounter (Signed)
Pt called req referral for sleep study, no referral in system. Please advise.

## 2012-08-10 NOTE — Telephone Encounter (Signed)
Done. Thx.

## 2012-08-11 LAB — HM MAMMOGRAPHY

## 2012-08-26 ENCOUNTER — Telehealth: Payer: Self-pay | Admitting: General Practice

## 2012-08-26 ENCOUNTER — Ambulatory Visit (INDEPENDENT_AMBULATORY_CARE_PROVIDER_SITE_OTHER): Payer: BC Managed Care – PPO | Admitting: General Practice

## 2012-08-26 DIAGNOSIS — I82409 Acute embolism and thrombosis of unspecified deep veins of unspecified lower extremity: Secondary | ICD-10-CM

## 2012-08-26 DIAGNOSIS — Z7901 Long term (current) use of anticoagulants: Secondary | ICD-10-CM

## 2012-08-26 DIAGNOSIS — I82402 Acute embolism and thrombosis of unspecified deep veins of left lower extremity: Secondary | ICD-10-CM

## 2012-08-26 NOTE — Telephone Encounter (Signed)
Message copied by Garrison Columbus on Wed Aug 26, 2012  2:20 PM ------      Message from: Tresa Garter      Created: Wed Aug 26, 2012  1:44 PM       Arline Asp, she was supposed to d/c her Coumadin on her DVT 6 mo anniversary as I recall.      Thx,      AP      ----- Message -----         From: Garrison Columbus, RN         Sent: 08/26/2012  10:55 AM           To: Tresa Garter, MD            Is this patient still taking coumadin or has it been discontinued?            Thanks,      Arline Asp       ------

## 2012-10-02 ENCOUNTER — Encounter: Payer: Self-pay | Admitting: Internal Medicine

## 2012-10-02 ENCOUNTER — Ambulatory Visit (INDEPENDENT_AMBULATORY_CARE_PROVIDER_SITE_OTHER): Payer: BC Managed Care – PPO | Admitting: Internal Medicine

## 2012-10-02 VITALS — BP 104/62 | HR 86 | Resp 16 | Wt 181.6 lb

## 2012-10-02 DIAGNOSIS — R0609 Other forms of dyspnea: Secondary | ICD-10-CM

## 2012-10-02 DIAGNOSIS — F341 Dysthymic disorder: Secondary | ICD-10-CM

## 2012-10-02 DIAGNOSIS — R599 Enlarged lymph nodes, unspecified: Secondary | ICD-10-CM

## 2012-10-02 DIAGNOSIS — R590 Localized enlarged lymph nodes: Secondary | ICD-10-CM

## 2012-10-02 DIAGNOSIS — E669 Obesity, unspecified: Secondary | ICD-10-CM

## 2012-10-02 DIAGNOSIS — R0683 Snoring: Secondary | ICD-10-CM

## 2012-10-02 DIAGNOSIS — R0989 Other specified symptoms and signs involving the circulatory and respiratory systems: Secondary | ICD-10-CM

## 2012-10-02 DIAGNOSIS — I87009 Postthrombotic syndrome without complications of unspecified extremity: Secondary | ICD-10-CM | POA: Insufficient documentation

## 2012-10-02 MED ORDER — LORCASERIN HCL 10 MG PO TABS: 10.0000 mg | ORAL_TABLET | Freq: Two times a day (BID) | ORAL | Status: DC

## 2012-10-02 MED ORDER — ASPIRIN 325 MG PO TABS: 650.0000 mg | ORAL_TABLET | Freq: Every day | ORAL | Status: DC

## 2012-10-02 MED ORDER — VASCULERA PO TABS: 1.0000 | ORAL_TABLET | Freq: Two times a day (BID) | ORAL | Status: DC

## 2012-10-02 NOTE — Assessment & Plan Note (Signed)
Wt watchers [employer]

## 2012-10-02 NOTE — Assessment & Plan Note (Signed)
Compr sock Try Vasculera Wt loss

## 2012-10-02 NOTE — Progress Notes (Signed)
   Subjective:    HPI  F/u ER for palpitations and DVT  LLE in Sept 13, off Coum since March. F/u - occ pain in LLE - achy. C/o poss OSA C/o PVC on heart monitor - some better  Wt Readings from Last 3 Encounters:  10/02/12 181 lb 9 oz (82.356 kg)  07/03/12 200 lb (90.719 kg)  03/09/12 208 lb (94.348 kg)   BP Readings from Last 3 Encounters:  10/02/12 104/62  07/03/12 120/62  03/09/12 100/60      Review of Systems  Constitutional: Negative for chills, activity change, appetite change, fatigue and unexpected weight change.  HENT: Negative for congestion, mouth sores and sinus pressure.   Eyes: Negative for visual disturbance.  Respiratory: Negative for cough and chest tightness.   Gastrointestinal: Negative for nausea and abdominal pain.  Genitourinary: Negative for frequency, difficulty urinating and vaginal pain.  Musculoskeletal: Negative for back pain and gait problem.  Skin: Negative for pallor and rash.  Neurological: Negative for dizziness, tremors, weakness, numbness and headaches.  Psychiatric/Behavioral: Negative for confusion and sleep disturbance.       Objective:   Physical Exam  Constitutional: She appears well-developed. No distress.  HENT:  Head: Normocephalic.  Right Ear: External ear normal.  Left Ear: External ear normal.  Nose: Nose normal.  Mouth/Throat: Oropharynx is clear and moist.  Eyes: Conjunctivae are normal. Pupils are equal, round, and reactive to light. Right eye exhibits no discharge. Left eye exhibits no discharge.  Neck: Normal range of motion. Neck supple. No JVD present. No tracheal deviation present. No thyromegaly present.  Cardiovascular: Normal rate, regular rhythm and normal heart sounds.   Pulmonary/Chest: No stridor. No respiratory distress. She has no wheezes.  Abdominal: Soft. Bowel sounds are normal. She exhibits no distension and no mass. There is no tenderness. There is no rebound and no guarding.  Musculoskeletal: She  exhibits no edema and no tenderness.  Lymphadenopathy:    She has no cervical adenopathy.  Neurological: She displays normal reflexes. No cranial nerve deficit. She exhibits normal muscle tone. Coordination normal.  Skin: No rash noted. No erythema.  Psychiatric: She has a normal mood and affect. Her behavior is normal. Judgment and thought content normal.  No edema Eczema on hands - B wrists  Lab Results  Component Value Date   WBC 8.7 07/03/2012   HGB 12.4 07/03/2012   HCT 37.1 07/03/2012   PLT 241.0 07/03/2012   GLUCOSE 80 07/03/2012   CHOL 157 08/18/2009   TRIG 116.0 08/18/2009   HDL 57.70 08/18/2009   LDLCALC 76 08/18/2009   ALT 20 07/03/2012   AST 21 07/03/2012   NA 142 07/03/2012   K 4.6 07/03/2012   CL 108 07/03/2012   CREATININE 0.7 07/03/2012   BUN 10 07/03/2012   CO2 29 07/03/2012   TSH 0.83 07/03/2012   INR 2.1 07/03/2012         Assessment & Plan:

## 2012-10-05 ENCOUNTER — Telehealth: Payer: Self-pay | Admitting: Internal Medicine

## 2012-10-05 DIAGNOSIS — R590 Localized enlarged lymph nodes: Secondary | ICD-10-CM

## 2012-10-05 NOTE — Telephone Encounter (Signed)
Misty Stanley, please, inform patient that we need to repeat CT of her chest to f/u on enlarged lymph nodes. We will  Schedule CT Thx

## 2012-10-05 NOTE — Assessment & Plan Note (Signed)
Better  

## 2012-10-05 NOTE — Assessment & Plan Note (Signed)
We will need to repeat CT

## 2012-10-06 NOTE — Telephone Encounter (Signed)
Left detailed mess informing pt of below.  

## 2012-10-08 ENCOUNTER — Ambulatory Visit (INDEPENDENT_AMBULATORY_CARE_PROVIDER_SITE_OTHER)
Admission: RE | Admit: 2012-10-08 | Discharge: 2012-10-08 | Disposition: A | Discharge status: Home / Self Care | Payer: BC Managed Care – PPO | Source: Ambulatory Visit | Attending: Internal Medicine | Admitting: Internal Medicine

## 2012-10-08 DIAGNOSIS — R599 Enlarged lymph nodes, unspecified: Secondary | ICD-10-CM

## 2012-10-08 MED ORDER — IOHEXOL 300 MG/ML  SOLN
80.0000 mL | Freq: Once | INTRAMUSCULAR | Status: AC | PRN
  Administered 2012-10-08: 80 mL via INTRAVENOUS

## 2012-10-19 ENCOUNTER — Ambulatory Visit: Payer: BC Managed Care – PPO

## 2012-10-19 DIAGNOSIS — R0609 Other forms of dyspnea: Secondary | ICD-10-CM

## 2012-10-19 DIAGNOSIS — G4710 Hypersomnia, unspecified: Secondary | ICD-10-CM

## 2012-10-19 DIAGNOSIS — R0683 Snoring: Secondary | ICD-10-CM

## 2012-10-19 DIAGNOSIS — G479 Sleep disorder, unspecified: Secondary | ICD-10-CM

## 2012-10-20 ENCOUNTER — Telehealth: Payer: Self-pay

## 2012-10-20 DIAGNOSIS — G4733 Obstructive sleep apnea (adult) (pediatric): Secondary | ICD-10-CM

## 2012-10-20 NOTE — Telephone Encounter (Signed)
[email or web address] Tonya Watkins ° °

## 2012-10-22 NOTE — Telephone Encounter (Signed)
This pt returned the alice and study will be downloaded and given to MD to read pt will be notified by her prim dr Tobe Sos

## 2012-10-23 ENCOUNTER — Encounter: Payer: Self-pay | Admitting: Internal Medicine

## 2012-10-25 ENCOUNTER — Other Ambulatory Visit: Payer: Self-pay | Admitting: Internal Medicine

## 2012-10-25 DIAGNOSIS — G4733 Obstructive sleep apnea (adult) (pediatric): Secondary | ICD-10-CM

## 2012-11-24 ENCOUNTER — Ambulatory Visit (INDEPENDENT_AMBULATORY_CARE_PROVIDER_SITE_OTHER): Payer: BC Managed Care – PPO | Admitting: Pulmonary Disease

## 2012-11-24 ENCOUNTER — Encounter: Payer: Self-pay | Admitting: *Deleted

## 2012-11-24 ENCOUNTER — Encounter: Payer: Self-pay | Admitting: Pulmonary Disease

## 2012-11-24 VITALS — BP 122/72 | HR 95 | Temp 98.5°F | Ht 63.0 in | Wt 169.8 lb

## 2012-11-24 DIAGNOSIS — G4733 Obstructive sleep apnea (adult) (pediatric): Secondary | ICD-10-CM | POA: Insufficient documentation

## 2012-11-24 NOTE — Progress Notes (Signed)
Chief Complaint  Patient presents with  . Sleep Consult    Ref by Dr. Plotnikov--c/o snoring, fatigue, sleep study done showed moderate sleep apnea-- Epworth score 17    History of Present Illness: Tonya Watkins is a 42 y.o. female for evaluation of sleep problems.  She has noticed problems with her sleep for years.  She has trouble falling asleep, and staying asleep.  She feels sleepy during the day.  She snores, and has been told she stops breathing while asleep.  She wakes up feeling like she can't catch her breath.  She had a home sleep study which showed moderate sleep apnea.  She was referred to pulmonary/sleep medicine for further assessment.  She goes to sleep at 10 pm.  She falls asleep after 30 minutes usually.  She wakes up several times to use the bathroom.  She gets out of bed at 430 am.  She feels tired in the morning.  She denies morning headache.  She does not use anything to help her fall sleep or stay awake.  She denies sleep walking, sleep talking, bruxism, or nightmares.  There is no history of restless legs.  She denies sleep hallucinations, sleep paralysis, or cataplexy.  The Epworth score is 17 out of 24.  She has lost about 40 lbs, but still has trouble with her sleep.  Tests: HST 10/19/12 >> AHI 20.5, SpO2 low 84%  Kensy Teixeira  has a past medical history of Heart palpitations and DVT (deep venous thrombosis).  Shalika Boghosian  has no past surgical history on file.  Prior to Admission medications   Medication Sig Start Date End Date Taking? Authorizing Provider  aspirin 325 MG tablet Take 2 tablets (650 mg total) by mouth daily. For headaches 10/02/12  Yes Tresa Garter, MD  Multiple Vitamin (MULTIVITAMIN WITH MINERALS) TABS Take 1 tablet by mouth daily.   Yes Historical Provider, MD  valACYclovir (VALTREX) 500 MG tablet Take 500 mg by mouth daily as needed (cold sores).   Yes Historical Provider, MD  Dietary Management Product (VASCULERA) TABS Take 1 tablet  by mouth 2 (two) times daily. 10/02/12   Georgina Quint Plotnikov, MD    No Known Allergies  Her family history includes Allergies in her mother and Cancer in her mother.  She  reports that she has never smoked. She has never used smokeless tobacco. She reports that she does not drink alcohol or use illicit drugs.  Review of Systems  Constitutional: Positive for unexpected weight change. Negative for fever.  HENT: Positive for congestion. Negative for ear pain, nosebleeds, sore throat, rhinorrhea, sneezing, trouble swallowing, dental problem, postnasal drip and sinus pressure.   Eyes: Negative for redness and itching.  Respiratory: Negative for cough, chest tightness, shortness of breath and wheezing.   Cardiovascular: Positive for palpitations. Negative for leg swelling.  Gastrointestinal: Negative for nausea and vomiting.  Genitourinary: Negative for dysuria.  Musculoskeletal: Negative for joint swelling.  Skin: Negative for rash.  Neurological: Negative for headaches.  Hematological: Does not bruise/bleed easily.  Psychiatric/Behavioral: Negative for dysphoric mood. The patient is not nervous/anxious.    Physical Exam:  General - No distress ENT - No sinus tenderness, mild overbite, 2+ tonsils, MP 2, scalloped tongue, no oral exudate, no LAN, no thyromegaly, TM clear, pupils equal/reactive Cardiac - s1s2 regular, no murmur, pulses symmetric Chest - No wheeze/rales/dullness, good air entry, normal respiratory excursion Back - No focal tenderness Abd - Soft, non-tender, no organomegaly, + bowel sounds Ext - No edema  Neuro - Normal strength, cranial nerves intact Skin - No rashes Psych - Normal mood, and behavior

## 2012-11-24 NOTE — Progress Notes (Deleted)
  Subjective:    Patient ID: Tonya Watkins, female    DOB: Feb 28, 1971, 42 y.o.   MRN: [id number]  HPI    Review of Systems  Constitutional: Positive for unexpected weight change. Negative for fever.  HENT: Positive for congestion. Negative for ear pain, nosebleeds, sore throat, rhinorrhea, sneezing, trouble swallowing, dental problem, postnasal drip and sinus pressure.   Eyes: Negative for redness and itching.  Respiratory: Negative for cough, chest tightness, shortness of breath and wheezing.   Cardiovascular: Positive for palpitations. Negative for leg swelling.  Gastrointestinal: Negative for nausea and vomiting.  Genitourinary: Negative for dysuria.  Musculoskeletal: Negative for joint swelling.  Skin: Negative for rash.  Neurological: Negative for headaches.  Hematological: Does not bruise/bleed easily.  Psychiatric/Behavioral: Negative for dysphoric mood. The patient is not nervous/anxious.        Objective:   Physical Exam        Assessment & Plan:

## 2012-11-24 NOTE — Patient Instructions (Signed)
Will arrange for CPAP set up Follow up 2 months after CPAP set up 

## 2012-11-24 NOTE — Assessment & Plan Note (Signed)
She has snoring, sleep disruption, witnessed apnea, and daytime sleepiness.  Her sleep study shows moderate obstructive sleep apnea.  I have reviewed the recent sleep study results with the patient.  We discussed how sleep apnea can affect various health problems including risks for hypertension, cardiovascular disease, and diabetes.  We also discussed how sleep disruption can increase risks for accident, such as while driving.  Weight loss as a means of improving sleep apnea was also reviewed.  Additional treatment options discussed were CPAP therapy, oral appliance, and surgical intervention.  Will arrange for auto CPAP set up.

## 2013-01-05 ENCOUNTER — Ambulatory Visit: Payer: BC Managed Care – PPO | Admitting: Internal Medicine

## 2013-01-05 DIAGNOSIS — Z0289 Encounter for other administrative examinations: Secondary | ICD-10-CM

## 2013-02-05 ENCOUNTER — Ambulatory Visit: Payer: BC Managed Care – PPO | Admitting: Pulmonary Disease

## 2013-02-10 ENCOUNTER — Encounter: Payer: Self-pay | Admitting: Pulmonary Disease

## 2013-02-19 ENCOUNTER — Ambulatory Visit (INDEPENDENT_AMBULATORY_CARE_PROVIDER_SITE_OTHER): Payer: BC Managed Care – PPO | Admitting: Pulmonary Disease

## 2013-02-19 ENCOUNTER — Encounter: Payer: Self-pay | Admitting: Pulmonary Disease

## 2013-02-19 VITALS — BP 122/60 | HR 60 | Ht 63.0 in | Wt 158.0 lb

## 2013-02-19 DIAGNOSIS — Z23 Encounter for immunization: Secondary | ICD-10-CM

## 2013-02-19 DIAGNOSIS — G4733 Obstructive sleep apnea (adult) (pediatric): Secondary | ICD-10-CM

## 2013-02-19 NOTE — Patient Instructions (Signed)
Will get copy of CPAP report and call with results Follow up in 1 year 

## 2013-02-19 NOTE — Progress Notes (Signed)
Chief Complaint  Patient presents with  . Sleep Apnea    Currently using CPAP every night. Denies any problems with the machine or mask.     History of Present Illness: Tonya Watkins is a 42 y.o. female with OSA.  She has been doing well with CPAP.  She is sleeping better, and feels more energy during the day.  She has full face mask, but thinks she may switch to nasal mask.   TESTS: HST 10/19/12 >> AHI 20.5, SpO2 low 84%  Tonya Watkins  has a past medical history of Heart palpitations and DVT (deep venous thrombosis).  Tonya Watkins  has no past surgical history on file.  Prior to Admission medications   Medication Sig Start Date End Date Taking? Authorizing Provider  aspirin 325 MG tablet Take 2 tablets (650 mg total) by mouth daily. For headaches 10/02/12  Yes Tresa Garter, MD  Dietary Management Product (VASCULERA) TABS Take 1 tablet by mouth 2 (two) times daily. 10/02/12  Yes Tresa Garter, MD  Multiple Vitamin (MULTIVITAMIN WITH MINERALS) TABS Take 1 tablet by mouth daily.   Yes Historical Provider, MD  valACYclovir (VALTREX) 500 MG tablet Take 500 mg by mouth daily as needed (cold sores).   Yes Historical Provider, MD    No Known Allergies   Physical Exam:  General - No distress ENT - No sinus tenderness, no oral exudate, no LAN, 2+ tonsils Cardiac - s1s2 regular, no murmur Chest - No wheeze/rales/dullness Back - No focal tenderness Abd - Soft, non-tender Ext - No edema Neuro - Normal strength Skin - No rashes Psych - normal mood, and behavior   Assessment/Plan:  Coralyn Helling, MD [facility] Pager:  [phone]

## 2013-02-19 NOTE — Assessment & Plan Note (Signed)
She is compliant with CPAP and reports benefit.  Will get copy of her download and call her with results.

## 2013-03-10 ENCOUNTER — Encounter: Payer: Self-pay | Admitting: Internal Medicine

## 2013-03-10 ENCOUNTER — Ambulatory Visit (INDEPENDENT_AMBULATORY_CARE_PROVIDER_SITE_OTHER): Payer: BC Managed Care – PPO | Admitting: Internal Medicine

## 2013-03-10 VITALS — BP 110/70 | HR 55 | Temp 98.3°F | Ht 63.0 in | Wt 157.0 lb

## 2013-03-10 DIAGNOSIS — F329 Major depressive disorder, single episode, unspecified: Secondary | ICD-10-CM | POA: Insufficient documentation

## 2013-03-10 DIAGNOSIS — G5600 Carpal tunnel syndrome, unspecified upper limb: Secondary | ICD-10-CM

## 2013-03-10 DIAGNOSIS — M25511 Pain in right shoulder: Secondary | ICD-10-CM

## 2013-03-10 DIAGNOSIS — I87009 Postthrombotic syndrome without complications of unspecified extremity: Secondary | ICD-10-CM

## 2013-03-10 DIAGNOSIS — F341 Dysthymic disorder: Secondary | ICD-10-CM

## 2013-03-10 DIAGNOSIS — M25519 Pain in unspecified shoulder: Secondary | ICD-10-CM

## 2013-03-10 DIAGNOSIS — G5601 Carpal tunnel syndrome, right upper limb: Secondary | ICD-10-CM

## 2013-03-10 MED ORDER — KETOROLAC TROMETHAMINE 60 MG/2ML IM SOLN
60.0000 mg | Freq: Once | INTRAMUSCULAR | Status: AC
  Administered 2013-03-10: 60 mg via INTRAMUSCULAR

## 2013-03-10 MED ORDER — TRAMADOL HCL 50 MG PO TABS: 50.0000 mg | ORAL_TABLET | Freq: Three times a day (TID) | ORAL | Status: DC | PRN

## 2013-03-10 MED ORDER — BUPROPION HCL ER (XL) 150 MG PO TB24: 150.0000 mg | ORAL_TABLET | Freq: Every day | ORAL | Status: DC

## 2013-03-10 MED ORDER — MELOXICAM 15 MG PO TABS: 15.0000 mg | ORAL_TABLET | Freq: Every day | ORAL | Status: DC

## 2013-03-10 NOTE — Assessment & Plan Note (Addendum)
10/14 R bursitis Mobic qd Tramadol prn  Toradol IM ROM exercises

## 2013-03-10 NOTE — Progress Notes (Signed)
Subjective:    Shoulder Pain  The pain is present in the right shoulder. This is a new problem. The current episode started 1 to 4 weeks ago. There has been no history of extremity trauma. The problem occurs constantly. The problem has been gradually worsening. The quality of the pain is described as dull. The pain is moderate. Associated symptoms include a limited range of motion. Pertinent negatives include no numbness. The symptoms are aggravated by activity. She has tried acetaminophen for the symptoms. The treatment provided no relief. Family history does not include gout.    F/u ER for palpitations and DVT  LLE in Sept 13, off Coum since March. F/u - occ pain in LLE - achy. 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  HENT: Negative for congestion, mouth sores and sinus pressure.   Eyes: Negative for visual disturbance.  Respiratory: Negative for cough and chest tightness.   Gastrointestinal: Negative for nausea and abdominal pain.  Genitourinary: Negative for frequency, difficulty urinating and vaginal pain.  Musculoskeletal: Negative for back pain and gait problem.  Skin: Negative for pallor and rash.  Neurological: Negative for dizziness, tremors, weakness, numbness and headaches.  Psychiatric/Behavioral: Negative for confusion and sleep disturbance.       Objective:   Physical Exam  Constitutional: She appears well-developed. No distress.  HENT:  Head: Normocephalic.  Right Ear: External ear normal.  Left Ear: External ear normal.  Nose: Nose  normal.  Mouth/Throat: Oropharynx is clear and moist.  Eyes: Conjunctivae are normal. Pupils are equal, round, and reactive to light. Right eye exhibits no discharge. Left eye exhibits no discharge.  Neck: Normal range of motion. Neck supple. No JVD present. No tracheal deviation present. No thyromegaly present.  Cardiovascular: Normal rate, regular rhythm and normal heart sounds.   Pulmonary/Chest: No stridor. No respiratory distress. She has no wheezes.  Abdominal: Soft. Bowel sounds are normal. She exhibits no distension and no mass. There is no tenderness. There is no rebound and no guarding.  Musculoskeletal: She exhibits no edema and no tenderness.  Lymphadenopathy:    She has no cervical adenopathy.  Neurological: She displays normal reflexes. No cranial nerve deficit. She exhibits normal muscle tone. Coordination normal.  Skin: No rash noted. No erythema.  Psychiatric: She has a normal mood and affect. Her behavior is normal. Judgment and thought content normal.  No edema R shoulder is tender - subacr MS OK  Lab Results  Component Value Date   WBC 8.7 07/03/2012   HGB 12.4 07/03/2012   HCT 37.1 07/03/2012   PLT 241.0 07/03/2012   GLUCOSE 80 07/03/2012   CHOL 157 08/18/2009   TRIG 116.0 08/18/2009   HDL 57.70 08/18/2009   LDLCALC 76 08/18/2009   ALT 20 07/03/2012   AST 21 07/03/2012   NA 142 07/03/2012   K 4.6 07/03/2012   CL 108 07/03/2012   CREATININE 0.7 07/03/2012   BUN 10 07/03/2012   CO2 29 07/03/2012   TSH 0.83 07/03/2012  INR 2.1 07/03/2012         Assessment & Plan:

## 2013-03-10 NOTE — Assessment & Plan Note (Addendum)
Start Wellbutrin XL 10/14 stress related

## 2013-03-10 NOTE — Assessment & Plan Note (Signed)
Vasculera 

## 2013-03-10 NOTE — Assessment & Plan Note (Signed)
R chronic x years 10/13 worse  Splint Meloxicam

## 2013-04-23 ENCOUNTER — Ambulatory Visit: Payer: BC Managed Care – PPO | Admitting: Internal Medicine

## 2013-04-23 DIAGNOSIS — Z0289 Encounter for other administrative examinations: Secondary | ICD-10-CM

## 2013-07-14 ENCOUNTER — Telehealth: Payer: Self-pay | Admitting: *Deleted

## 2013-07-14 MED ORDER — BUPROPION HCL ER (XL) 150 MG PO TB24: 150.0000 mg | ORAL_TABLET | Freq: Every day | ORAL | Status: DC

## 2013-07-14 NOTE — Telephone Encounter (Signed)
Patient phoned requesting refill for wellbutrin (03/10/13) and last OV with PCP 03/10/13.  Please advise.  CB# [phone]

## 2013-07-14 NOTE — Telephone Encounter (Signed)
OK to fill this prescription with additional refills x3 Sch OV in 2-3 mo Thank you!

## 2013-07-14 NOTE — Telephone Encounter (Signed)
Sent scripts to pharmacy on file per MD with note attached for patient to schedule OV with PCP within 2-3 months and left voicemail notifying patient.

## 2013-09-20 ENCOUNTER — Telehealth: Payer: Self-pay | Admitting: *Deleted

## 2013-09-20 NOTE — Telephone Encounter (Signed)
OK to fill this prescription with additional refills x1 Thank you!  

## 2013-09-20 NOTE — Telephone Encounter (Signed)
Pt called requesting Valcyclovir refill.  Please advise

## 2013-09-21 MED ORDER — VALACYCLOVIR HCL 500 MG PO TABS: 500.0000 mg | ORAL_TABLET | Freq: Every day | ORAL | Status: DC | PRN

## 2013-09-21 NOTE — Telephone Encounter (Signed)
Left message on VM advising Rx sent

## 2013-11-29 ENCOUNTER — Other Ambulatory Visit: Payer: Self-pay | Admitting: Internal Medicine

## 2013-12-09 ENCOUNTER — Other Ambulatory Visit: Payer: Self-pay | Admitting: Internal Medicine

## 2013-12-10 ENCOUNTER — Telehealth: Payer: Self-pay | Admitting: Internal Medicine

## 2013-12-10 MED ORDER — BUPROPION HCL ER (XL) 150 MG PO TB24: ORAL_TABLET | ORAL | Status: DC

## 2013-12-10 NOTE — Telephone Encounter (Signed)
Notified pt rx has been sent need to make yearly physical Oct.../lmb

## 2013-12-10 NOTE — Telephone Encounter (Signed)
Pt request refill for Wellbutrin. Please advise, pt used [facility] [address].

## 2013-12-22 ENCOUNTER — Telehealth: Payer: Self-pay | Admitting: Internal Medicine

## 2013-12-22 NOTE — Telephone Encounter (Signed)
[facility] calling on status of order sent over 7/28.  Would like phone call back.

## 2013-12-22 NOTE — Telephone Encounter (Signed)
Called [facility] back she stated they did received order just was not noted in pt chart...Raechel Chute/lmb

## 2014-01-12 ENCOUNTER — Other Ambulatory Visit: Payer: Self-pay | Admitting: Internal Medicine

## 2014-01-18 IMAGING — CT CT ANGIO CHEST
2 of 6 series · 19 of 46 positions shown · IV contrast (APPLIED)
Comparison: None.

CLINICAL DATA: Palpitations, chest pain.

CT ANGIOGRAPHY CHEST
TECHNIQUE: Multidetector CT imaging of the chest using the
standard protocol during bolus administration of intravenous
contrast. Multiplanar reconstructed images including MIPs were
obtained and reviewed to evaluate the vascular anatomy.
Contrast: 100mL OMNIPAQUE IOHEXOL 350 MG/ML SOLN

[Series 6: pulm embolism 1.0 b25f thin · axial · 0.63mm/px · z∈[+1214,+1435]mm · 16 of 243 slices shown]
[im 11/243  lung]
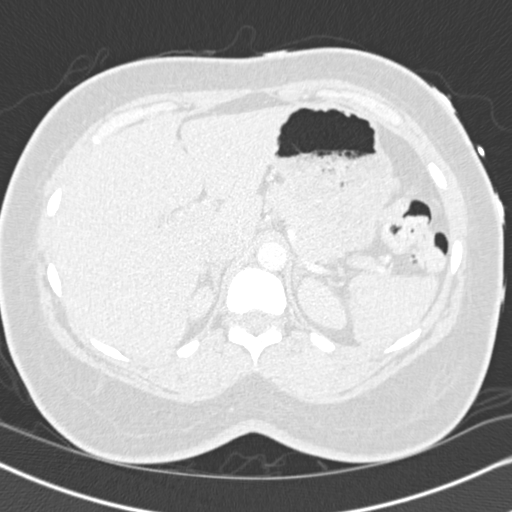
[im 32/243  soft-tissue]
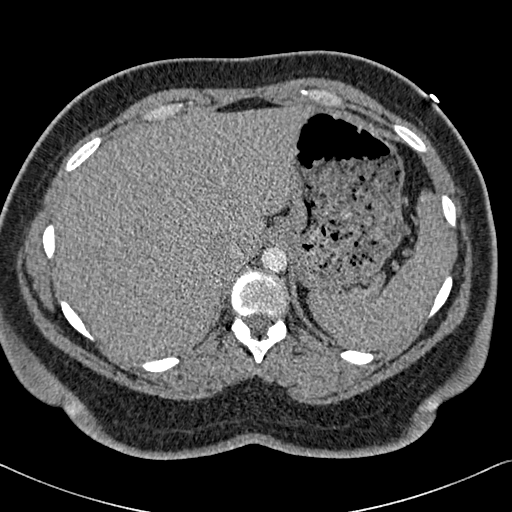
[im 43/243  lung]
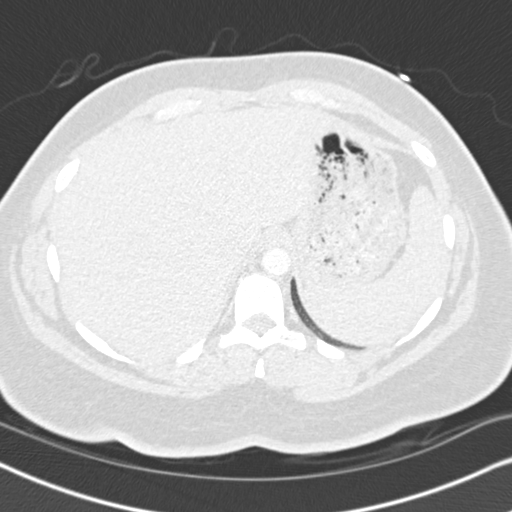
[im 53/243  soft-tissue]
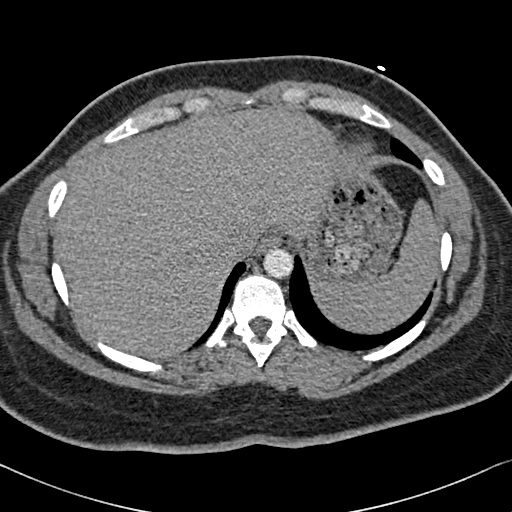
[im 74/243  lung]
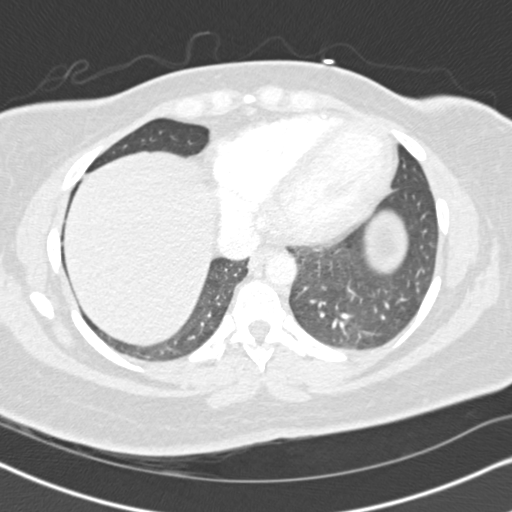
[im 85/243  soft-tissue]
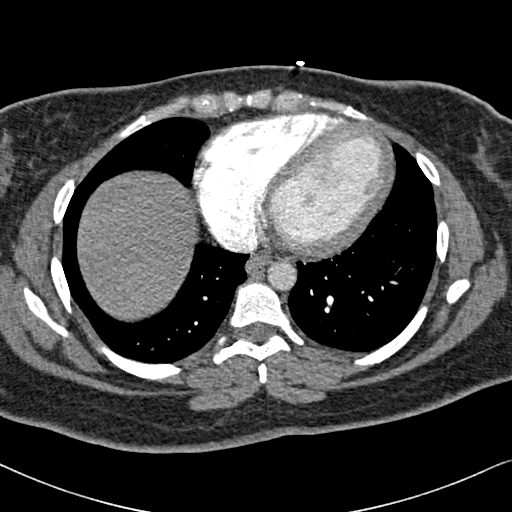
[im 95/243  lung]
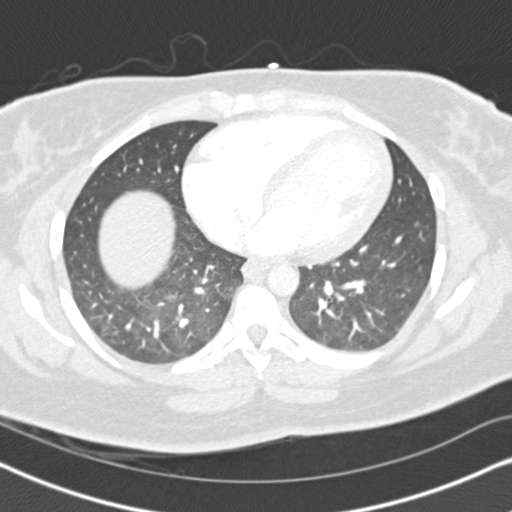
[im 116/243  soft-tissue]
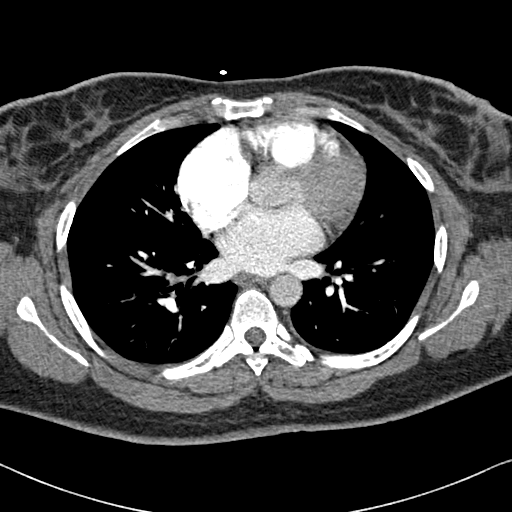
[im 127/243  lung]
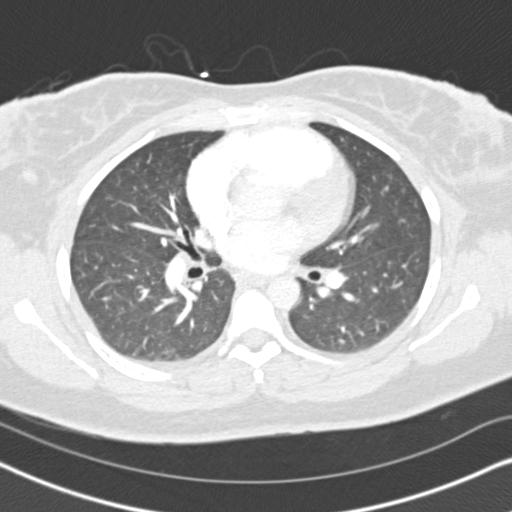
[im 148/243  soft-tissue]
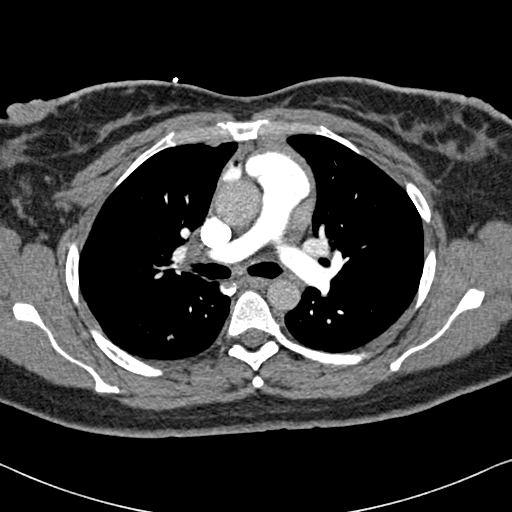
[im 158/243  lung]
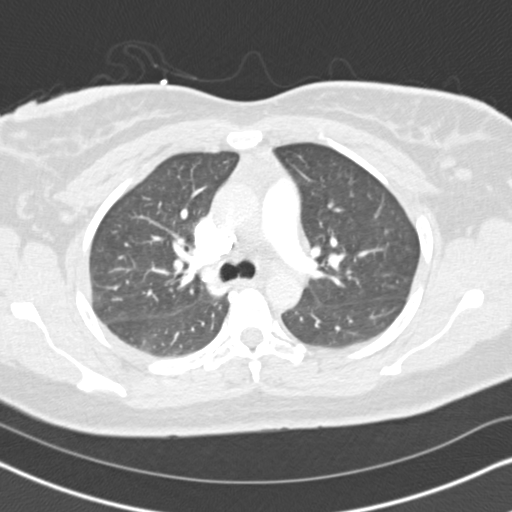
[im 169/243  soft-tissue]
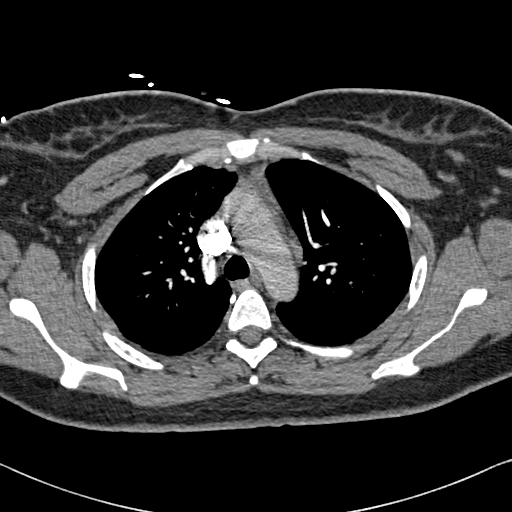
[im 190/243  lung]
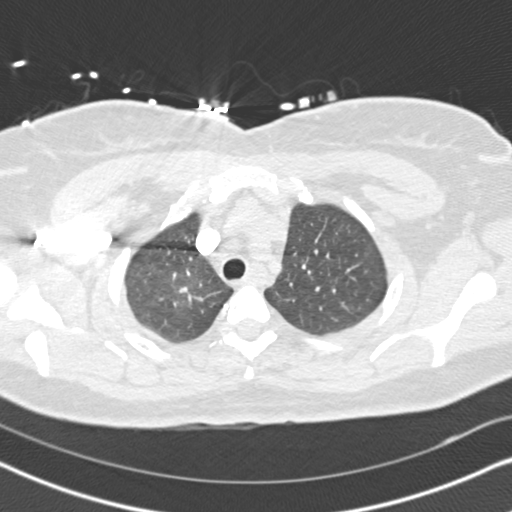
[im 200/243  soft-tissue]
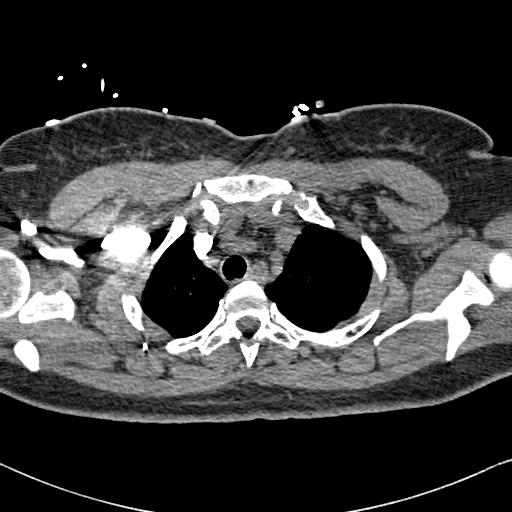
[im 211/243  lung]
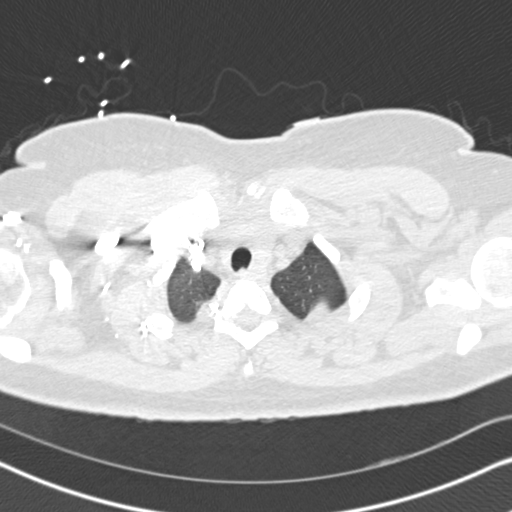
[im 232/243  soft-tissue]
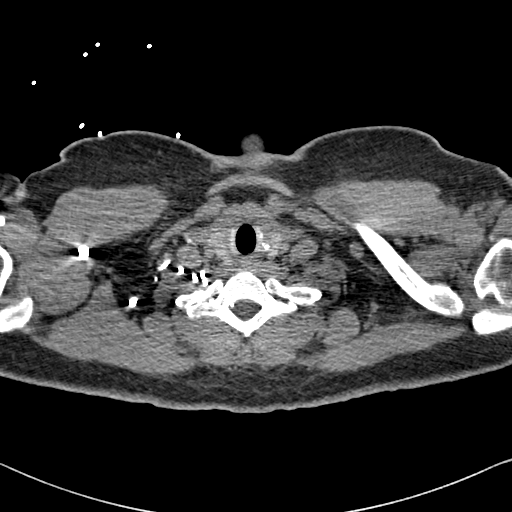

[Series 602: cor · coronal · 0.63mm/px · 3 of 101 slices shown]
[im 26/101  soft-tissue]
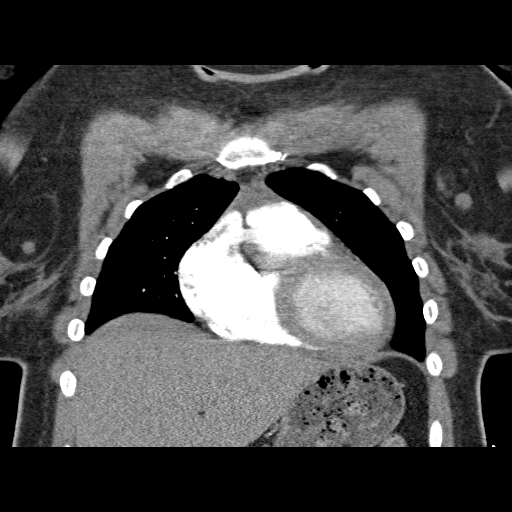
[im 51/101  soft-tissue]
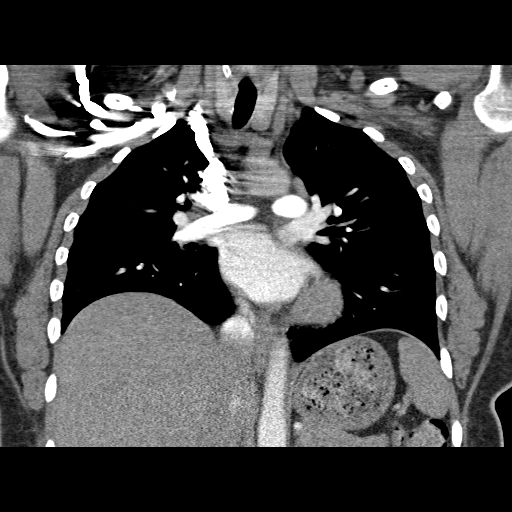
[im 76/101  soft-tissue]
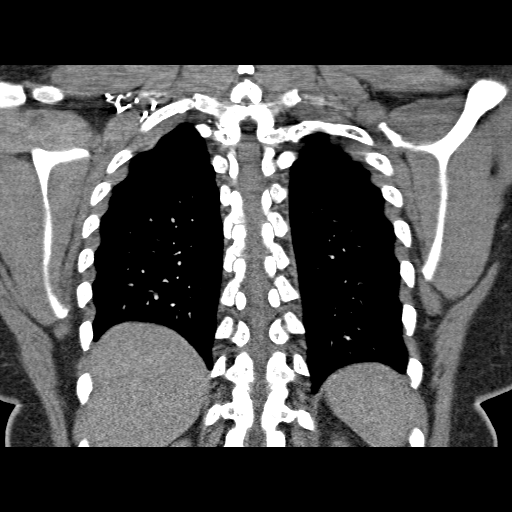

[19 of 46 positions shown; findings below may reference images not displayed]

FINDINGS: No filling defects in the pulmonary arteries to suggest
pulmonary emboli. Heart is normal size. Aorta is normal caliber.
Lungs are clear.  No focal airspace opacities or suspicious
nodules.  No effusions.

There are borderline and mildly enlarged axillary and subpectoral
lymph nodes bilaterally.  Index node on the left on image 13 has a
short axis diameter of 12 mm.  Left axillary lymph node on image 23
has a short axis diameter of 10 mm.  Lymph nodes on the right are
noted but less prominent.  No mediastinal or hilar adenopathy.

Chest wall soft tissues are otherwise unremarkable. Imaging into
the upper abdomen shows no acute findings.  No acute bony
abnormality.
IMPRESSION: No evidence of pulmonary embolus.

Borderline and mildly enlarged bilateral axillary and subpectoral
lymph nodes, left greater than right.  While these may be reactive,
I cannot be completely exclude neoplastic process such as lymphoma.
Recommend follow up with repeat CT in the 3-6 months or further
valuation with PET CT.

## 2014-01-18 IMAGING — CR DG CHEST 2V
2 series · 2 of 2 positions shown · non-contrast
Comparison: PA and lateral chest 06/20/2006.

CLINICAL DATA: Shortness of breath, cough and chest pain.

CHEST - 2 VIEW

[w chest pa]
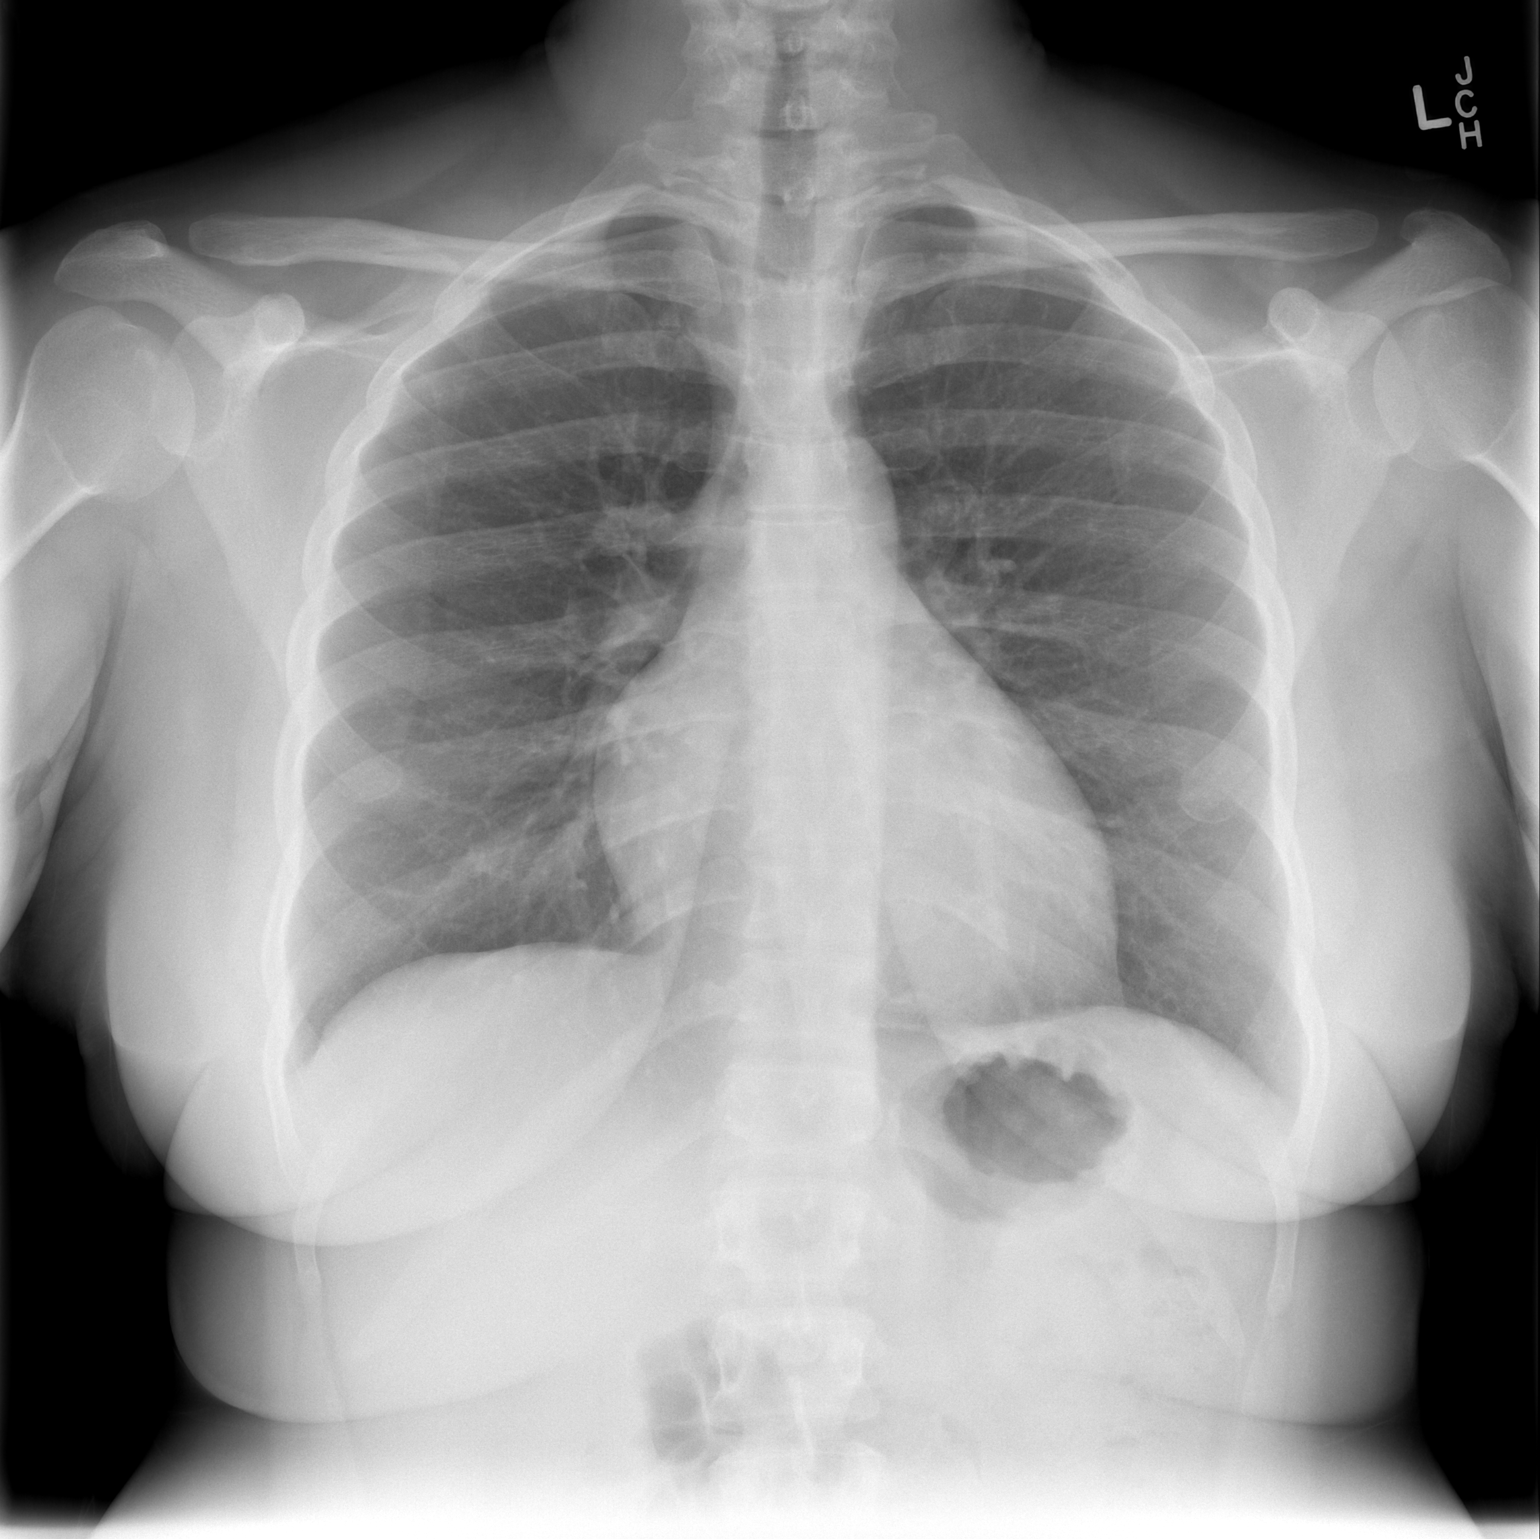

[w chest lat]
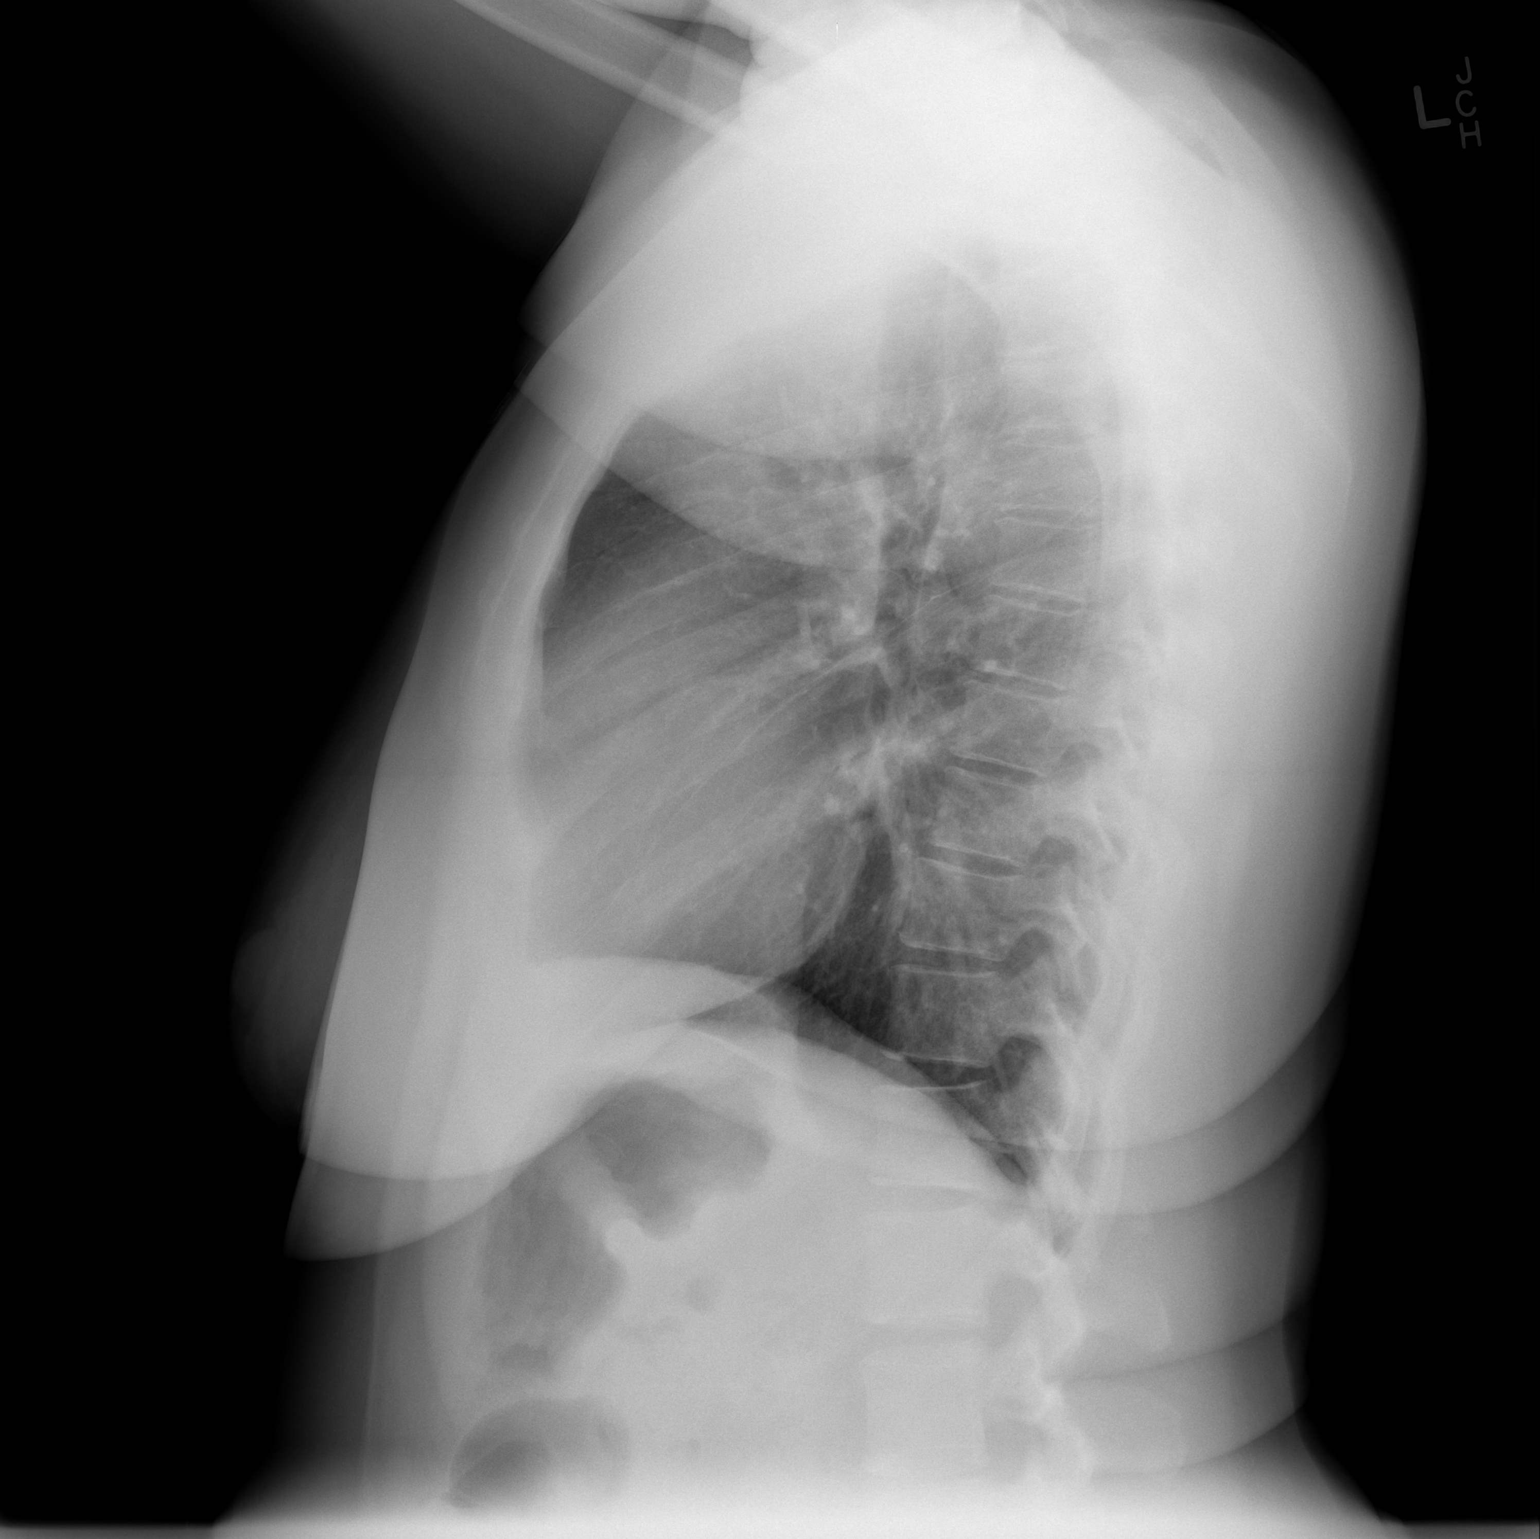

[2 of 2 positions shown; findings below may reference images not displayed]

FINDINGS: Lungs clear.  Heart size is normal.  No pneumothorax or
effusion.
IMPRESSION: Negative chest.

## 2014-03-06 ENCOUNTER — Other Ambulatory Visit: Payer: Self-pay | Admitting: Internal Medicine

## 2014-04-10 ENCOUNTER — Other Ambulatory Visit: Payer: Self-pay | Admitting: Internal Medicine

## 2014-06-19 ENCOUNTER — Other Ambulatory Visit: Payer: Self-pay | Admitting: Internal Medicine

## 2014-08-09 ENCOUNTER — Other Ambulatory Visit: Payer: Self-pay | Admitting: Internal Medicine

## 2014-08-24 ENCOUNTER — Other Ambulatory Visit: Payer: Self-pay | Admitting: Internal Medicine

## 2014-08-30 ENCOUNTER — Other Ambulatory Visit (INDEPENDENT_AMBULATORY_CARE_PROVIDER_SITE_OTHER): Payer: BLUE CROSS/BLUE SHIELD

## 2014-08-30 ENCOUNTER — Encounter: Payer: Self-pay | Admitting: Internal Medicine

## 2014-08-30 ENCOUNTER — Ambulatory Visit (INDEPENDENT_AMBULATORY_CARE_PROVIDER_SITE_OTHER): Payer: BLUE CROSS/BLUE SHIELD | Admitting: Internal Medicine

## 2014-08-30 VITALS — BP 110/70 | HR 60 | Ht 63.0 in | Wt 174.0 lb

## 2014-08-30 DIAGNOSIS — F341 Dysthymic disorder: Secondary | ICD-10-CM | POA: Diagnosis not present

## 2014-08-30 DIAGNOSIS — L309 Dermatitis, unspecified: Secondary | ICD-10-CM | POA: Diagnosis not present

## 2014-08-30 DIAGNOSIS — R202 Paresthesia of skin: Secondary | ICD-10-CM

## 2014-08-30 DIAGNOSIS — G5603 Carpal tunnel syndrome, bilateral upper limbs: Secondary | ICD-10-CM

## 2014-08-30 DIAGNOSIS — G4733 Obstructive sleep apnea (adult) (pediatric): Secondary | ICD-10-CM

## 2014-08-30 DIAGNOSIS — F329 Major depressive disorder, single episode, unspecified: Secondary | ICD-10-CM

## 2014-08-30 DIAGNOSIS — G5602 Carpal tunnel syndrome, left upper limb: Secondary | ICD-10-CM

## 2014-08-30 DIAGNOSIS — E669 Obesity, unspecified: Secondary | ICD-10-CM | POA: Diagnosis not present

## 2014-08-30 DIAGNOSIS — G5601 Carpal tunnel syndrome, right upper limb: Secondary | ICD-10-CM

## 2014-08-30 LAB — CBC WITH DIFFERENTIAL/PLATELET
BASOS ABS: 0 10*3/uL (ref 0.0–0.1)
BASOS PCT: 0.2 % (ref 0.0–3.0)
EOS ABS: 0.1 10*3/uL (ref 0.0–0.7)
Eosinophils Relative: 2.4 % (ref 0.0–5.0)
HCT: 40.3 % (ref 36.0–46.0)
Hemoglobin: 13.6 g/dL (ref 12.0–15.0)
Lymphocytes Relative: 24.1 % (ref 12.0–46.0)
Lymphs Abs: 1.4 10*3/uL (ref 0.7–4.0)
MCHC: 33.8 g/dL (ref 30.0–36.0)
MCV: 92.1 fl (ref 78.0–100.0)
MONO ABS: 0.5 10*3/uL (ref 0.1–1.0)
Monocytes Relative: 8 % (ref 3.0–12.0)
NEUTROS PCT: 65.3 % (ref 43.0–77.0)
Neutro Abs: 3.8 10*3/uL (ref 1.4–7.7)
Platelets: 230 10*3/uL (ref 150.0–400.0)
RBC: 4.38 Mil/uL (ref 3.87–5.11)
RDW: 13 % (ref 11.5–15.5)
WBC: 5.8 10*3/uL (ref 4.0–10.5)

## 2014-08-30 LAB — HEPATIC FUNCTION PANEL
ALK PHOS: 37 U/L — AB (ref 39–117)
ALT: 20 U/L (ref 0–35)
AST: 19 U/L (ref 0–37)
Albumin: 4.4 g/dL (ref 3.5–5.2)
BILIRUBIN DIRECT: 0.1 mg/dL (ref 0.0–0.3)
BILIRUBIN TOTAL: 0.6 mg/dL (ref 0.2–1.2)
Total Protein: 7.7 g/dL (ref 6.0–8.3)

## 2014-08-30 LAB — LIPID PANEL
Cholesterol: 121 mg/dL (ref 0–200)
HDL: 52.3 mg/dL (ref >39.00)
LDL Cholesterol: 63 mg/dL (ref 0–99)
NonHDL: 68.7
Total CHOL/HDL Ratio: 2
Triglycerides: 31 mg/dL (ref 0.0–149.0)
VLDL: 6.2 mg/dL (ref 0.0–40.0)

## 2014-08-30 LAB — VITAMIN D 25 HYDROXY (VIT D DEFICIENCY, FRACTURES): VITD: 55.37 ng/mL (ref 30.00–100.00)

## 2014-08-30 LAB — URINALYSIS, ROUTINE W REFLEX MICROSCOPIC
Bilirubin Urine: NEGATIVE
KETONES UR: NEGATIVE
Leukocytes, UA: NEGATIVE
Nitrite: NEGATIVE
SPECIFIC GRAVITY, URINE: 1.015 (ref 1.000–1.030)
Total Protein, Urine: NEGATIVE
URINE GLUCOSE: NEGATIVE
UROBILINOGEN UA: 0.2 (ref 0.0–1.0)
pH: 6 (ref 5.0–8.0)

## 2014-08-30 LAB — BASIC METABOLIC PANEL
BUN: 15 mg/dL (ref 6–23)
CHLORIDE: 103 meq/L (ref 96–112)
CO2: 29 meq/L (ref 19–32)
Calcium: 10 mg/dL (ref 8.4–10.5)
Creatinine, Ser: 0.76 mg/dL (ref 0.40–1.20)
GFR: 106.92 mL/min (ref >60.00)
GLUCOSE: 82 mg/dL (ref 70–99)
POTASSIUM: 4 meq/L (ref 3.5–5.1)
Sodium: 136 mEq/L (ref 135–145)

## 2014-08-30 LAB — TSH: TSH: 1.07 u[IU]/mL (ref 0.35–4.50)

## 2014-08-30 LAB — VITAMIN B12: Vitamin B-12: >1500 pg/mL — ABNORMAL HIGH (ref 211–911)

## 2014-08-30 MED ORDER — VALACYCLOVIR HCL 500 MG PO TABS
500.0000 mg | ORAL_TABLET | Freq: Every day | ORAL | Status: DC
Start: 2014-08-30 — End: 2015-12-08

## 2014-08-30 MED ORDER — VITAMIN D 1000 UNITS PO TABS
1000.0000 [IU] | ORAL_TABLET | Freq: Every day | ORAL | Status: AC
Start: 2014-08-30 — End: 2015-08-30

## 2014-08-30 MED ORDER — BUPROPION HCL ER (XL) 150 MG PO TB24: ORAL_TABLET | ORAL | Status: DC

## 2014-08-30 MED ORDER — BETAMETHASONE DIPROPIONATE AUG 0.05 % EX CREA: TOPICAL_CREAM | Freq: Two times a day (BID) | CUTANEOUS | Status: DC

## 2014-08-30 NOTE — Assessment & Plan Note (Signed)
Diprolene prn

## 2014-08-30 NOTE — Progress Notes (Signed)
Pre visit review using our clinic review tool, if applicable. No additional management support is needed unless otherwise documented below in the visit note. 

## 2014-08-30 NOTE — Assessment & Plan Note (Signed)
Restart Wellbutrin 

## 2014-08-30 NOTE — Assessment & Plan Note (Signed)
CPAP use

## 2014-08-30 NOTE — Assessment & Plan Note (Signed)
Wt gain - low carb diet

## 2014-08-30 NOTE — Progress Notes (Signed)
   Subjective:    HPI  Tonya Watkins is here to f/u DVT  LLE in Sept 13, off Coum since March 2014. F/u OSA - on CPAP C/o occ palpitations, PVC on heart monitor - better. C/o wt gain  Wt Readings from Last 3 Encounters:  08/30/14 174 lb (78.926 kg)  03/10/13 157 lb (71.215 kg)  02/19/13 158 lb (71.668 kg)   BP Readings from Last 3 Encounters:  08/30/14 110/70  03/10/13 110/70  02/19/13 122/60      Review of Systems  Constitutional: Negative for chills, activity change, appetite change, fatigue and unexpected weight change.  HENT: Negative for congestion, mouth sores and sinus pressure.   Eyes: Negative for visual disturbance.  Respiratory: Negative for cough and chest tightness.   Gastrointestinal: Negative for nausea and abdominal pain.  Genitourinary: Negative for frequency, difficulty urinating and vaginal pain.  Musculoskeletal: Negative for back pain and gait problem.  Skin: Negative for pallor and rash.  Neurological: Negative for dizziness, tremors, weakness, numbness and headaches.  Psychiatric/Behavioral: Negative for confusion and sleep disturbance.       Objective:   Physical Exam  Constitutional: She appears well-developed. No distress.  HENT:  Head: Normocephalic.  Right Ear: External ear normal.  Left Ear: External ear normal.  Nose: Nose normal.  Mouth/Throat: Oropharynx is clear and moist.  Eyes: Conjunctivae are normal. Pupils are equal, round, and reactive to light. Right eye exhibits no discharge. Left eye exhibits no discharge.  Neck: Normal range of motion. Neck supple. No JVD present. No tracheal deviation present. No thyromegaly present.  Cardiovascular: Normal rate, regular rhythm and normal heart sounds.   Pulmonary/Chest: No stridor. No respiratory distress. She has no wheezes.  Abdominal: Soft. Bowel sounds are normal. She exhibits no distension and no mass. There is no tenderness. There is no rebound and no guarding.  Musculoskeletal: She exhibits  no edema or tenderness.  Lymphadenopathy:    She has no cervical adenopathy.  Neurological: She displays normal reflexes. No cranial nerve deficit. She exhibits normal muscle tone. Coordination normal.  Skin: No rash noted. No erythema.  Psychiatric: She has a normal mood and affect. Her behavior is normal. Judgment and thought content normal.  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

## 2015-09-13 ENCOUNTER — Other Ambulatory Visit: Payer: Self-pay | Admitting: Internal Medicine

## 2015-09-14 ENCOUNTER — Telehealth: Payer: Self-pay | Admitting: Internal Medicine

## 2015-09-19 ENCOUNTER — Ambulatory Visit: Payer: BLUE CROSS/BLUE SHIELD | Admitting: Internal Medicine

## 2015-09-19 DIAGNOSIS — Z0289 Encounter for other administrative examinations: Secondary | ICD-10-CM

## 2015-12-08 ENCOUNTER — Other Ambulatory Visit: Payer: Self-pay | Admitting: Internal Medicine

## 2015-12-23 ENCOUNTER — Other Ambulatory Visit: Payer: Self-pay | Admitting: Internal Medicine

## 2016-02-18 ENCOUNTER — Other Ambulatory Visit: Payer: Self-pay | Admitting: Internal Medicine

## 2016-04-10 ENCOUNTER — Other Ambulatory Visit (INDEPENDENT_AMBULATORY_CARE_PROVIDER_SITE_OTHER): Payer: BLUE CROSS/BLUE SHIELD

## 2016-04-10 ENCOUNTER — Encounter: Payer: Self-pay | Admitting: Internal Medicine

## 2016-04-10 ENCOUNTER — Ambulatory Visit (INDEPENDENT_AMBULATORY_CARE_PROVIDER_SITE_OTHER): Payer: BLUE CROSS/BLUE SHIELD | Admitting: Internal Medicine

## 2016-04-10 VITALS — BP 104/60 | HR 55 | Wt 200.0 lb

## 2016-04-10 DIAGNOSIS — E6609 Other obesity due to excess calories: Secondary | ICD-10-CM

## 2016-04-10 DIAGNOSIS — I87009 Postthrombotic syndrome without complications of unspecified extremity: Secondary | ICD-10-CM | POA: Diagnosis not present

## 2016-04-10 DIAGNOSIS — Z6835 Body mass index (BMI) 35.0-35.9, adult: Secondary | ICD-10-CM

## 2016-04-10 DIAGNOSIS — G4733 Obstructive sleep apnea (adult) (pediatric): Secondary | ICD-10-CM

## 2016-04-10 DIAGNOSIS — F329 Major depressive disorder, single episode, unspecified: Secondary | ICD-10-CM

## 2016-04-10 DIAGNOSIS — G5603 Carpal tunnel syndrome, bilateral upper limbs: Secondary | ICD-10-CM

## 2016-04-10 DIAGNOSIS — R5383 Other fatigue: Secondary | ICD-10-CM

## 2016-04-10 DIAGNOSIS — F341 Dysthymic disorder: Secondary | ICD-10-CM

## 2016-04-10 DIAGNOSIS — F32A Depression, unspecified: Secondary | ICD-10-CM | POA: Insufficient documentation

## 2016-04-10 DIAGNOSIS — R5382 Chronic fatigue, unspecified: Secondary | ICD-10-CM | POA: Insufficient documentation

## 2016-04-10 LAB — BASIC METABOLIC PANEL
BUN: 9 mg/dL (ref 6–23)
CALCIUM: 8.9 mg/dL (ref 8.4–10.5)
CO2: 30 mEq/L (ref 19–32)
Chloride: 105 mEq/L (ref 96–112)
Creatinine, Ser: 0.63 mg/dL (ref 0.40–1.20)
GFR: 131.77 mL/min (ref >60.00)
GLUCOSE: 63 mg/dL — AB (ref 70–99)
POTASSIUM: 4.2 meq/L (ref 3.5–5.1)
Sodium: 140 mEq/L (ref 135–145)

## 2016-04-10 LAB — TSH: TSH: 0.69 u[IU]/mL (ref 0.35–4.50)

## 2016-04-10 LAB — CBC WITH DIFFERENTIAL/PLATELET
BASOS PCT: 0.4 % (ref 0.0–3.0)
Basophils Absolute: 0 10*3/uL (ref 0.0–0.1)
EOS PCT: 7.7 % — AB (ref 0.0–5.0)
Eosinophils Absolute: 0.5 10*3/uL (ref 0.0–0.7)
HEMATOCRIT: 37.8 % (ref 36.0–46.0)
HEMOGLOBIN: 12.9 g/dL (ref 12.0–15.0)
LYMPHS PCT: 29.6 % (ref 12.0–46.0)
Lymphs Abs: 1.9 10*3/uL (ref 0.7–4.0)
MCHC: 34.1 g/dL (ref 30.0–36.0)
MCV: 92.1 fl (ref 78.0–100.0)
MONOS PCT: 8.4 % (ref 3.0–12.0)
Monocytes Absolute: 0.5 10*3/uL (ref 0.1–1.0)
Neutro Abs: 3.4 10*3/uL (ref 1.4–7.7)
Neutrophils Relative %: 53.9 % (ref 43.0–77.0)
Platelets: 241 10*3/uL (ref 150.0–400.0)
RBC: 4.11 Mil/uL (ref 3.87–5.11)
RDW: 13.3 % (ref 11.5–15.5)
WBC: 6.3 10*3/uL (ref 4.0–10.5)

## 2016-04-10 LAB — HEPATIC FUNCTION PANEL
ALBUMIN: 4.2 g/dL (ref 3.5–5.2)
ALT: 22 U/L (ref 0–35)
AST: 16 U/L (ref 0–37)
Alkaline Phosphatase: 36 U/L — ABNORMAL LOW (ref 39–117)
Bilirubin, Direct: 0.1 mg/dL (ref 0.0–0.3)
Total Bilirubin: 0.4 mg/dL (ref 0.2–1.2)
Total Protein: 7.1 g/dL (ref 6.0–8.3)

## 2016-04-10 LAB — SEDIMENTATION RATE: Sed Rate: 7 mm/hr (ref 0–20)

## 2016-04-10 LAB — VITAMIN B12: VITAMIN B 12: 944 pg/mL — AB (ref 211–911)

## 2016-04-10 MED ORDER — BETAMETHASONE DIPROPIONATE AUG 0.05 % EX CREA: TOPICAL_CREAM | Freq: Two times a day (BID) | CUTANEOUS | 3 refills | Status: DC

## 2016-04-10 MED ORDER — MELOXICAM 15 MG PO TABS: 15.0000 mg | ORAL_TABLET | Freq: Every day | ORAL | 2 refills | Status: DC

## 2016-04-10 MED ORDER — VALACYCLOVIR HCL 500 MG PO TABS: 500.0000 mg | ORAL_TABLET | Freq: Every day | ORAL | 11 refills | Status: DC

## 2016-04-10 MED ORDER — BUPROPION HCL ER (XL) 150 MG PO TB24: 150.0000 mg | ORAL_TABLET | Freq: Every morning | ORAL | 11 refills | Status: DC

## 2016-04-10 NOTE — Patient Instructions (Signed)
GERD wedge pillow Wrist splints

## 2016-04-10 NOTE — Assessment & Plan Note (Signed)
Wt Readings from Last 3 Encounters:  04/10/16 200 lb (90.7 kg)  08/30/14 174 lb (78.9 kg)  03/10/13 157 lb (71.2 kg)  BMI 35

## 2016-04-10 NOTE — Assessment & Plan Note (Signed)
Worse GERD wedge pillow Ortho ref Wrist splints

## 2016-04-10 NOTE — Assessment & Plan Note (Signed)
Labs Sleep>7 h, use CPAP

## 2016-04-10 NOTE — Progress Notes (Signed)
Subjective:  Patient ID: Tonya Watkins, female    DOB: May 28, 1970  Age: 45 y.o. MRN: [id number]  CC: No chief complaint on file.   HPI Tonya Watkins presents for depression, knee pain, H simplex f/u OSA - not using a CPAP  Outpatient Medications Prior to Visit  Medication Sig Dispense Refill  . aspirin 325 MG tablet Take 2 tablets (650 mg total) by mouth daily. For headaches 100 tablet 3  . augmented betamethasone dipropionate (DIPROLENE AF) 0.05 % cream Apply topically 2 (two) times daily. 50 g 3  . buPROPion (WELLBUTRIN XL) 150 MG 24 hr tablet TAKE ONE TABLET BY MOUTH IN THE MORNING 30 tablet 0  . meloxicam (MOBIC) 15 MG tablet Take 1 tablet (15 mg total) by mouth daily. 30 tablet 2  . Multiple Vitamin (MULTIVITAMIN WITH MINERALS) TABS Take 1 tablet by mouth daily.    . valACYclovir (VALTREX) 500 MG tablet Take 1 tablet (500 mg total) by mouth daily. 30 tablet 0  . buPROPion (WELLBUTRIN XL) 150 MG 24 hr tablet TAKE ONE TABLET BY MOUTH IN THE MORNING 30 tablet 5   No facility-administered medications prior to visit.     ROS Review of Systems  Constitutional: Positive for fatigue and unexpected weight change. Negative for activity change, appetite change and chills.  HENT: Negative for congestion, mouth sores and sinus pressure.   Eyes: Negative for visual disturbance.  Respiratory: Negative for cough and chest tightness.   Gastrointestinal: Negative for abdominal pain and nausea.  Genitourinary: Negative for difficulty urinating, frequency and vaginal pain.  Musculoskeletal: Negative for back pain and gait problem.  Skin: Negative for pallor and rash.  Neurological: Negative for dizziness, tremors, weakness, numbness and headaches.  Psychiatric/Behavioral: Negative for confusion, sleep disturbance and suicidal ideas. The patient is nervous/anxious.     Objective:  BP 104/60   Pulse (!) 55   Wt 200 lb (90.7 kg)   SpO2 100%   BMI 35.43 kg/m   BP Readings from Last 3  Encounters:  04/10/16 104/60  08/30/14 110/70  03/10/13 110/70    Wt Readings from Last 3 Encounters:  04/10/16 200 lb (90.7 kg)  08/30/14 174 lb (78.9 kg)  03/10/13 157 lb (71.2 kg)    Physical Exam  Constitutional: She appears well-developed. No distress.  HENT:  Head: Normocephalic.  Right Ear: External ear normal.  Left Ear: External ear normal.  Nose: Nose normal.  Mouth/Throat: Oropharynx is clear and moist.  Eyes: Conjunctivae are normal. Pupils are equal, round, and reactive to light. Right eye exhibits no discharge. Left eye exhibits no discharge.  Neck: Normal range of motion. Neck supple. No JVD present. No tracheal deviation present. No thyromegaly present.  Cardiovascular: Normal rate, regular rhythm and normal heart sounds.   Pulmonary/Chest: No stridor. No respiratory distress. She has no wheezes.  Abdominal: Soft. Bowel sounds are normal. She exhibits no distension and no mass. There is no tenderness. There is no rebound and no guarding.  Musculoskeletal: She exhibits tenderness. She exhibits no edema.  Lymphadenopathy:    She has no cervical adenopathy.  Neurological: She displays normal reflexes. No cranial nerve deficit. She exhibits normal muscle tone. Coordination normal.  Skin: No rash noted. No erythema.  Psychiatric: She has a normal mood and affect. Her behavior is normal. Judgment and thought content normal.  Obese Knees tender Hands grip is ok B  Lab Results  Component Value Date   WBC 5.8 08/30/2014   HGB 13.6 08/30/2014   HCT  40.3 08/30/2014   PLT 230.0 08/30/2014   GLUCOSE 82 08/30/2014   CHOL 121 08/30/2014   TRIG 31.0 08/30/2014   HDL 52.30 08/30/2014   LDLCALC 63 08/30/2014   ALT 20 08/30/2014   AST 19 08/30/2014   NA 136 08/30/2014   K 4.0 08/30/2014   CL 103 08/30/2014   CREATININE 0.76 08/30/2014   BUN 15 08/30/2014   CO2 29 08/30/2014   TSH 1.07 08/30/2014   INR 2.1 07/03/2012    Ct Chest W Contrast  Result Date:  10/08/2012 *RADIOLOGY REPORT* Clinical Data: Follow-up axillary and subpectoral lymphadenopathy. CT CHEST WITH CONTRAST Technique:  Multidetector CT imaging of the chest was performed following the standard protocol during bolus administration of intravenous contrast. Contrast: 80mL OMNIPAQUE IOHEXOL 300 MG/ML  SOLN Comparison: 12/28/2011 Findings: Multiple small bilateral axillary and subpectoral lymph nodes are again seen measuring up to 10 mm, and are stable compared to prior exam.  No mediastinal or hilar lymphadenopathy identified. No adenopathy seen elsewhere within the thorax. No evidence of pleural or pericardial effusion.  Both lungs are clear.  No suspicious pulmonary nodules or masses are identified. No evidence of infiltrate or central endobronchial obstruction.  No evidence of chest wall mass or suspicious bone lesions. IMPRESSION: Stable shotty bilateral axillary and subpectoral lymph nodes measuring up to 10 mm.  No new or progressive lymphadenopathy or other significant abnormality. Original Report Authenticated By: Myles RosenthalJohn Stahl, M.D.    Assessment & Plan:   There are no diagnoses linked to this encounter. I am having Ms. Rhys MartiniKind maintain her multivitamin with minerals, aspirin, meloxicam, augmented betamethasone dipropionate, buPROPion, and valACYclovir.  No orders of the defined types were placed in this encounter.    Follow-up: No Follow-up on file.  Sonda PrimesAlex Ambriana Selway, MD

## 2016-04-10 NOTE — Assessment & Plan Note (Signed)
-    Wellbutrin XL

## 2016-04-10 NOTE — Assessment & Plan Note (Signed)
Use CPAP more consistently

## 2016-04-10 NOTE — Assessment & Plan Note (Signed)
LLE - compr sock

## 2016-04-10 NOTE — Progress Notes (Signed)
Pre visit review using our clinic review tool, if applicable. No additional management support is needed unless otherwise documented below in the visit note. 

## 2016-06-14 ENCOUNTER — Encounter: Payer: Self-pay | Admitting: Internal Medicine

## 2016-07-10 ENCOUNTER — Ambulatory Visit: Payer: BLUE CROSS/BLUE SHIELD | Admitting: Internal Medicine

## 2016-08-15 ENCOUNTER — Other Ambulatory Visit: Payer: Self-pay | Admitting: Internal Medicine

## 2016-11-06 ENCOUNTER — Telehealth: Payer: Self-pay | Admitting: Internal Medicine

## 2016-11-06 MED ORDER — BUPROPION HCL ER (XL) 150 MG PO TB24: 150.0000 mg | ORAL_TABLET | Freq: Every morning | ORAL | 0 refills | Status: DC

## 2016-11-06 MED ORDER — VALACYCLOVIR HCL 500 MG PO TABS: 500.0000 mg | ORAL_TABLET | Freq: Every day | ORAL | 0 refills | Status: DC

## 2016-11-06 MED ORDER — MELOXICAM 15 MG PO TABS: 15.0000 mg | ORAL_TABLET | Freq: Every day | ORAL | 0 refills | Status: DC

## 2016-11-06 NOTE — Telephone Encounter (Signed)
Pt is requesting a refill on Wellbutrin, Valtrex and Mobic [facility] [address].

## 2016-11-06 NOTE — Telephone Encounter (Signed)
Pt is due for f/u appt will send 30 day supply only until she come in for appt....Tonya Watkins/lmb

## 2016-11-12 NOTE — Telephone Encounter (Signed)
error 

## 2017-01-29 ENCOUNTER — Telehealth: Payer: Self-pay | Admitting: Internal Medicine

## 2017-01-29 MED ORDER — VALACYCLOVIR HCL 500 MG PO TABS: 500.0000 mg | ORAL_TABLET | Freq: Every day | ORAL | 0 refills | Status: DC

## 2017-01-29 MED ORDER — BUPROPION HCL ER (XL) 150 MG PO TB24: 150.0000 mg | ORAL_TABLET | Freq: Every morning | ORAL | 0 refills | Status: DC

## 2017-01-29 MED ORDER — MELOXICAM 15 MG PO TABS: 15.0000 mg | ORAL_TABLET | Freq: Every day | ORAL | 0 refills | Status: DC

## 2017-01-29 NOTE — Telephone Encounter (Signed)
Reviewed chart pt is due for annual appt in Nov sent 90 day script on requested medications to [facility].../lm,b

## 2017-01-29 NOTE — Telephone Encounter (Signed)
Patient is requesting 90 day scripts of bupropion, valtrex, and mobic to be sent to [facility].

## 2017-03-12 ENCOUNTER — Ambulatory Visit: Payer: BLUE CROSS/BLUE SHIELD | Admitting: Internal Medicine

## 2017-03-24 ENCOUNTER — Other Ambulatory Visit: Payer: Self-pay | Admitting: Internal Medicine

## 2017-03-24 ENCOUNTER — Ambulatory Visit (INDEPENDENT_AMBULATORY_CARE_PROVIDER_SITE_OTHER): Payer: 59 | Admitting: Internal Medicine

## 2017-03-24 ENCOUNTER — Encounter: Payer: Self-pay | Admitting: Internal Medicine

## 2017-03-24 VITALS — BP 108/64 | HR 57 | Temp 98.3°F | Ht 63.0 in | Wt 219.0 lb

## 2017-03-24 DIAGNOSIS — Z6835 Body mass index (BMI) 35.0-35.9, adult: Secondary | ICD-10-CM

## 2017-03-24 DIAGNOSIS — F329 Major depressive disorder, single episode, unspecified: Secondary | ICD-10-CM

## 2017-03-24 DIAGNOSIS — Z23 Encounter for immunization: Secondary | ICD-10-CM | POA: Diagnosis not present

## 2017-03-24 DIAGNOSIS — G4733 Obstructive sleep apnea (adult) (pediatric): Secondary | ICD-10-CM | POA: Diagnosis not present

## 2017-03-24 DIAGNOSIS — I87009 Postthrombotic syndrome without complications of unspecified extremity: Secondary | ICD-10-CM | POA: Diagnosis not present

## 2017-03-24 DIAGNOSIS — E6609 Other obesity due to excess calories: Secondary | ICD-10-CM

## 2017-03-24 MED ORDER — BUPROPION HCL ER (XL) 300 MG PO TB24: 300.0000 mg | ORAL_TABLET | Freq: Every day | ORAL | 3 refills | Status: DC

## 2017-03-24 NOTE — Assessment & Plan Note (Signed)
BMI 38. Ref to Dr Dalbert GarnetBeasley. Low carb diet

## 2017-03-24 NOTE — Progress Notes (Signed)
Subjective:  Patient ID: Tonya Watkins, female    DOB: [birth year]  Age: 46 y.o. MRN: [id number]  CC: No chief complaint on file.   HPI [name] Mossberg presents for wt gain, fatigue, HTN. Pt just had labs w/gyn Working from home now Using IUD  Outpatient Medications Prior to Visit  Medication Sig Dispense Refill  . aspirin 325 MG tablet Take 2 tablets (650 mg total) by mouth daily. For headaches 100 tablet 3  . augmented betamethasone dipropionate (DIPROLENE AF) 0.05 % cream Apply topically 2 (two) times daily. 50 g 3  . buPROPion (WELLBUTRIN XL) 150 MG 24 hr tablet Take 1 tablet (150 mg total) by mouth every morning. Annual appt due in Nov must see provider for future refills 90 tablet 0  . meloxicam (MOBIC) 15 MG tablet Take 1 tablet (15 mg total) by mouth daily. Annual appt due in Nov must see provider for future refills 90 tablet 0  . Multiple Vitamin (MULTIVITAMIN WITH MINERALS) TABS Take 1 tablet by mouth daily.    . valACYclovir (VALTREX) 500 MG tablet Take 1 tablet (500 mg total) by mouth daily. Annual appt due in Nov must see provider for future refills 90 tablet 0   No facility-administered medications prior to visit.     ROS Review of Systems  Constitutional: Positive for unexpected weight change. Negative for activity change, appetite change, chills and fatigue.  HENT: Negative for congestion, mouth sores and sinus pressure.   Eyes: Negative for visual disturbance.  Respiratory: Negative for cough and chest tightness.   Gastrointestinal: Negative for abdominal pain and nausea.  Genitourinary: Negative for difficulty urinating, frequency and vaginal pain.  Musculoskeletal: Negative for back pain and gait problem.  Skin: Negative for pallor and rash.  Neurological: Negative for dizziness, tremors, weakness, numbness and headaches.  Psychiatric/Behavioral: Negative for confusion and sleep disturbance.    Objective:  BP 108/64 (BP Location: Left Arm, Patient Position:  Sitting, Cuff Size: Large)   Pulse (!) 57   Temp 98.3 F (36.8 C) (Oral)   Ht 5\' 3"  (1.6 m)   Wt 219 lb (99.3 kg)   SpO2 98%   BMI 38.79 kg/m   BP Readings from Last 3 Encounters:  03/24/17 108/64  04/10/16 104/60  08/30/14 110/70    Wt Readings from Last 3 Encounters:  03/24/17 219 lb (99.3 kg)  04/10/16 200 lb (90.7 kg)  08/30/14 174 lb (78.9 kg)    Physical Exam  Constitutional: She appears well-developed. No distress.  HENT:  Head: Normocephalic.  Right Ear: External ear normal.  Left Ear: External ear normal.  Nose: Nose normal.  Mouth/Throat: Oropharynx is clear and moist.  Eyes: Conjunctivae are normal. Pupils are equal, round, and reactive to light. Right eye exhibits no discharge. Left eye exhibits no discharge.  Neck: Normal range of motion. Neck supple. No JVD present. No tracheal deviation present. No thyromegaly present.  Cardiovascular: Normal rate, regular rhythm and normal heart sounds.  Pulmonary/Chest: No stridor. No respiratory distress. She has no wheezes.  Abdominal: Soft. Bowel sounds are normal. She exhibits no distension and no mass. There is no tenderness. There is no rebound and no guarding.  Musculoskeletal: She exhibits no edema or tenderness.  Lymphadenopathy:    She has no cervical adenopathy.  Neurological: She displays normal reflexes. No cranial nerve deficit. She exhibits normal muscle tone. Coordination normal.  Skin: No rash noted. No erythema.  Psychiatric: She has a normal mood and affect. Her behavior is normal. Judgment and  thought content normal.  Obese  Lab Results  Component Value Date   WBC 6.3 04/10/2016   HGB 12.9 04/10/2016   HCT 37.8 04/10/2016   PLT 241.0 04/10/2016   GLUCOSE 63 (L) 04/10/2016   CHOL 121 08/30/2014   TRIG 31.0 08/30/2014   HDL 52.30 08/30/2014   LDLCALC 63 08/30/2014   ALT 22 04/10/2016   AST 16 04/10/2016   NA 140 04/10/2016   K 4.2 04/10/2016   CL 105 04/10/2016   CREATININE 0.63 04/10/2016     BUN 9 04/10/2016   CO2 30 04/10/2016   TSH 0.69 04/10/2016   INR 2.1 07/03/2012    Ct Chest W Contrast  Result Date: 10/08/2012 *RADIOLOGY REPORT* Clinical Data: Follow-up axillary and subpectoral lymphadenopathy. CT CHEST WITH CONTRAST Technique:  Multidetector CT imaging of the chest was performed following the standard protocol during bolus administration of intravenous contrast. Contrast: 80mL OMNIPAQUE IOHEXOL 300 MG/ML  SOLN Comparison: 12/28/2011 Findings: Multiple small bilateral axillary and subpectoral lymph nodes are again seen measuring up to 10 mm, and are stable compared to prior exam.  No mediastinal or hilar lymphadenopathy identified. No adenopathy seen elsewhere within the thorax. No evidence of pleural or pericardial effusion.  Both lungs are clear.  No suspicious pulmonary nodules or masses are identified. No evidence of infiltrate or central endobronchial obstruction.  No evidence of chest wall mass or suspicious bone lesions. IMPRESSION: Stable shotty bilateral axillary and subpectoral lymph nodes measuring up to 10 mm.  No new or progressive lymphadenopathy or other significant abnormality. Original Report Authenticated By: Myles RosenthalJohn Stahl, M.D.    Assessment & Plan:   There are no diagnoses linked to this encounter. I am having Tomekia Gergen maintain her multivitamin with minerals, aspirin, augmented betamethasone dipropionate, buPROPion, meloxicam, and valACYclovir.  No orders of the defined types were placed in this encounter.    Follow-up: No Follow-up on file.  Sonda PrimesAlex Plotnikov, MD

## 2017-03-24 NOTE — Assessment & Plan Note (Signed)
Worse Increase Wellbutrin to 300 mg/d

## 2017-03-24 NOTE — Patient Instructions (Signed)
Ref to Dr Quillian Quincearen Beasley.  carb diet

## 2017-03-24 NOTE — Assessment & Plan Note (Signed)
CPAP - not using Risks associated with CPAP treatment noncompliance were discussed. Compliance was encouraged.

## 2017-03-24 NOTE — Addendum Note (Signed)
Addended by: Scarlett PrestoFRIEDENBACH, Jayron Maqueda on: 03/24/2017 09:39 AM   Modules accepted: Orders

## 2017-03-24 NOTE — Assessment & Plan Note (Signed)
No relapse 

## 2017-05-16 ENCOUNTER — Other Ambulatory Visit: Payer: Self-pay | Admitting: Internal Medicine

## 2017-06-24 ENCOUNTER — Ambulatory Visit (INDEPENDENT_AMBULATORY_CARE_PROVIDER_SITE_OTHER): Payer: 59 | Admitting: Internal Medicine

## 2017-06-24 ENCOUNTER — Encounter: Payer: Self-pay | Admitting: Internal Medicine

## 2017-06-24 DIAGNOSIS — Z6835 Body mass index (BMI) 35.0-35.9, adult: Secondary | ICD-10-CM | POA: Diagnosis not present

## 2017-06-24 DIAGNOSIS — E6609 Other obesity due to excess calories: Secondary | ICD-10-CM

## 2017-06-24 DIAGNOSIS — F341 Dysthymic disorder: Secondary | ICD-10-CM | POA: Diagnosis not present

## 2017-06-24 MED ORDER — ESCITALOPRAM OXALATE 5 MG PO TABS: 5.0000 mg | ORAL_TABLET | Freq: Every day | ORAL | 5 refills | Status: DC

## 2017-06-24 MED ORDER — BUPROPION HCL ER (XL) 300 MG PO TB24: 300.0000 mg | ORAL_TABLET | Freq: Every day | ORAL | 3 refills | Status: DC

## 2017-06-24 NOTE — Progress Notes (Signed)
Subjective:  Patient ID: Tonya Watkins, female    DOB: 01/30/1971  Age: 47 y.o. MRN: [id number]  CC: No chief complaint on file.   HPI Tonya Watkins presents for depression and weight gain Wellbutrin at 300 helped a little. C/o stress... Just started on a diet  Outpatient Medications Prior to Visit  Medication Sig Dispense Refill  . aspirin 325 MG tablet Take 2 tablets (650 mg total) by mouth daily. For headaches 100 tablet 3  . augmented betamethasone dipropionate (DIPROLENE AF) 0.05 % cream Apply topically 2 (two) times daily. 50 g 3  . buPROPion (WELLBUTRIN XL) 300 MG 24 hr tablet Take 1 tablet (300 mg total) daily by mouth. 90 tablet 3  . meloxicam (MOBIC) 15 MG tablet TAKE 1 TABLET BY MOUTH  DAILY 90 tablet 1  . Multiple Vitamin (MULTIVITAMIN WITH MINERALS) TABS Take 1 tablet by mouth daily.    . valACYclovir (VALTREX) 500 MG tablet TAKE 1 TABLET BY MOUTH  DAILY 90 tablet 1  . buPROPion (WELLBUTRIN XL) 150 MG 24 hr tablet TAKE 1 TABLET BY MOUTH  DAILY IN THE MORNING (Patient not taking: Reported on 06/24/2017) 90 tablet 1   No facility-administered medications prior to visit.     ROS Review of Systems  Constitutional: Positive for unexpected weight change. Negative for activity change, appetite change, chills and fatigue.  HENT: Negative for congestion, mouth sores and sinus pressure.   Eyes: Negative for visual disturbance.  Respiratory: Negative for cough and chest tightness.   Gastrointestinal: Negative for abdominal pain and nausea.  Genitourinary: Negative for difficulty urinating, frequency and vaginal pain.  Musculoskeletal: Negative for back pain and gait problem.  Skin: Negative for pallor and rash.  Neurological: Negative for dizziness, tremors, weakness, numbness and headaches.  Psychiatric/Behavioral: Positive for dysphoric mood. Negative for confusion, sleep disturbance and suicidal ideas.    Objective:  BP 116/70 (BP Location: Right Arm, Patient Position:  Sitting, Cuff Size: Large)   Pulse 71   Temp 99.1 F (37.3 C) (Oral)   Ht 5\' 3"  (1.6 m)   Wt 221 lb 1.9 oz (100.3 kg)   SpO2 99%   BMI 39.17 kg/m   BP Readings from Last 3 Encounters:  06/24/17 116/70  03/24/17 108/64  04/10/16 104/60    Wt Readings from Last 3 Encounters:  06/24/17 221 lb 1.9 oz (100.3 kg)  03/24/17 219 lb (99.3 kg)  04/10/16 200 lb (90.7 kg)    Physical Exam  Constitutional: She appears well-developed. No distress.  HENT:  Head: Normocephalic.  Right Ear: External ear normal.  Left Ear: External ear normal.  Nose: Nose normal.  Mouth/Throat: Oropharynx is clear and moist.  Eyes: Conjunctivae are normal. Pupils are equal, round, and reactive to light. Right eye exhibits no discharge. Left eye exhibits no discharge.  Neck: Normal range of motion. Neck supple. No JVD present. No tracheal deviation present. No thyromegaly present.  Cardiovascular: Normal rate, regular rhythm and normal heart sounds.  Pulmonary/Chest: No stridor. No respiratory distress. She has no wheezes.  Abdominal: Soft. Bowel sounds are normal. She exhibits no distension and no mass. There is no tenderness. There is no rebound and no guarding.  Musculoskeletal: She exhibits no edema or tenderness.  Lymphadenopathy:    She has no cervical adenopathy.  Neurological: She displays normal reflexes. No cranial nerve deficit. She exhibits normal muscle tone. Coordination normal.  Skin: No rash noted. No erythema.  Psychiatric: She has a normal mood and affect. Her behavior is normal.  Judgment and thought content normal.  obese  Lab Results  Component Value Date   WBC 6.3 04/10/2016   HGB 12.9 04/10/2016   HCT 37.8 04/10/2016   PLT 241.0 04/10/2016   GLUCOSE 63 (L) 04/10/2016   CHOL 121 08/30/2014   TRIG 31.0 08/30/2014   HDL 52.30 08/30/2014   LDLCALC 63 08/30/2014   ALT 22 04/10/2016   AST 16 04/10/2016   NA 140 04/10/2016   K 4.2 04/10/2016   CL 105 04/10/2016   CREATININE 0.63  04/10/2016   BUN 9 04/10/2016   CO2 30 04/10/2016   TSH 0.69 04/10/2016   INR 2.1 07/03/2012    Ct Chest W Contrast  Result Date: 10/08/2012 *RADIOLOGY REPORT* Clinical Data: Follow-up axillary and subpectoral lymphadenopathy. CT CHEST WITH CONTRAST Technique:  Multidetector CT imaging of the chest was performed following the standard protocol during bolus administration of intravenous contrast. Contrast: 80mL OMNIPAQUE IOHEXOL 300 MG/ML  SOLN Comparison: 12/28/2011 Findings: Multiple small bilateral axillary and subpectoral lymph nodes are again seen measuring up to 10 mm, and are stable compared to prior exam.  No mediastinal or hilar lymphadenopathy identified. No adenopathy seen elsewhere within the thorax. No evidence of pleural or pericardial effusion.  Both lungs are clear.  No suspicious pulmonary nodules or masses are identified. No evidence of infiltrate or central endobronchial obstruction.  No evidence of chest wall mass or suspicious bone lesions. IMPRESSION: Stable shotty bilateral axillary and subpectoral lymph nodes measuring up to 10 mm.  No new or progressive lymphadenopathy or other significant abnormality. Original Report Authenticated By: Myles Rosenthal, M.D.    Assessment & Plan:   There are no diagnoses linked to this encounter. I am having Tamantha Mulkern maintain her multivitamin with minerals, aspirin, augmented betamethasone dipropionate, meloxicam, buPROPion, valACYclovir, and buPROPion.  No orders of the defined types were placed in this encounter.    Follow-up: No Follow-up on file.  Sonda Primes, MD

## 2017-06-24 NOTE — Assessment & Plan Note (Signed)
Wt Readings from Last 3 Encounters:  06/24/17 221 lb 1.9 oz (100.3 kg)  03/24/17 219 lb (99.3 kg)  04/10/16 200 lb (90.7 kg)  On diet now

## 2017-06-24 NOTE — Assessment & Plan Note (Signed)
added Lexapro low dose

## 2017-07-04 ENCOUNTER — Telehealth: Payer: Self-pay | Admitting: Internal Medicine

## 2017-07-04 NOTE — Telephone Encounter (Signed)
Copied from CRM [phone]. Topic: Quick Communication - See Telephone Encounter >> Jul 04, 2017 10:52 AM Waymon AmatoBurton, [name] F wrote: CRM for notification. See Telephone encounter for: pt had a prescription of Bupropion xl 300 mg was sent  to [facility] and was not able to get it and she is wanting to know it it can be called in now to [facility] [address] in [place]-  Best number [phone]  07/04/17.

## 2017-07-04 NOTE — Telephone Encounter (Signed)
Pt   Requesting  Rx  For  Bupropion  Be   Sent to   [facility]  [address]  In [place]   Instead  Of  [facility]   -  [facility]  Has  An  Active  rx  For  Her  At  This   Time  For  That  Med.  Pt  Was  Advised  [facility]  And  Have  The  rx  Transferred from [facility]  Phone  Numbers  Given to patient

## 2017-08-15 ENCOUNTER — Encounter: Payer: Self-pay | Admitting: Emergency Medicine

## 2017-08-15 ENCOUNTER — Ambulatory Visit (INDEPENDENT_AMBULATORY_CARE_PROVIDER_SITE_OTHER): Payer: 59 | Admitting: Emergency Medicine

## 2017-08-15 VITALS — BP 103/68 | HR 75 | Temp 98.4°F | Resp 17 | Ht 62.5 in | Wt 206.0 lb

## 2017-08-15 DIAGNOSIS — H6591 Unspecified nonsuppurative otitis media, right ear: Secondary | ICD-10-CM | POA: Diagnosis not present

## 2017-08-15 DIAGNOSIS — H9311 Tinnitus, right ear: Secondary | ICD-10-CM

## 2017-08-15 DIAGNOSIS — H9319 Tinnitus, unspecified ear: Secondary | ICD-10-CM | POA: Insufficient documentation

## 2017-08-15 MED ORDER — AZITHROMYCIN 250 MG PO TABS: ORAL_TABLET | ORAL | 0 refills | Status: DC

## 2017-08-15 MED ORDER — HYDROCORTISONE-ACETIC ACID 1-2 % OT SOLN: 3.0000 [drp] | Freq: Three times a day (TID) | OTIC | 1 refills | Status: DC

## 2017-08-15 NOTE — Patient Instructions (Addendum)
     IF you received an x-ray today, you will receive an invoice from [facility]. Please contact [facility] at [phone] with questions or concerns regarding your invoice.   IF you received labwork today, you will receive an invoice from [facility]. Please contact [facility] at [phone] with questions or concerns regarding your invoice.   Our billing staff will not be able to assist you with questions regarding bills from these companies.  You will be contacted with the lab results as soon as they are available. The fastest way to get your results is to activate your My Chart account. Instructions are located on the last page of this paperwork. If you have not heard from us regarding the results in 2 weeks, please contact this office.     Otitis Media, Adult Otitis media is redness, soreness, and puffiness (swelling) in the space just behind your eardrum (middle ear). It may be caused by allergies or infection. It often happens along with a cold. Follow these instructions at home:  Take your medicine as told. Finish it even if you start to feel better.  Only take over-the-counter or prescription medicines for pain, discomfort, or fever as told by your doctor.  Follow up with your doctor as told. Contact a doctor if:  You have otitis media only in one ear, or bleeding from your nose, or both.  You notice a lump on your neck.  You are not getting better in 3-5 days.  You feel worse instead of better. Get help right away if:  You have pain that is not helped with medicine.  You have puffiness, redness, or pain around your ear.  You get a stiff neck.  You cannot move part of your face (paralysis).  You notice that the bone behind your ear hurts when you touch it. This information is not intended to replace advice given to you by your health care provider. Make sure you discuss any questions you have with your health care provider. Document Released:  10/16/2007 Document Revised: 10/05/2015 Document Reviewed: 11/24/2012 Elsevier Interactive Patient Education  2017 [employer]  

## 2017-08-15 NOTE — Progress Notes (Signed)
Tonya Watkins 47 y.o.   Chief Complaint  Patient presents with  . Tinnitus    HISTORY OF PRESENT ILLNESS: This is a 47 y.o. female complaining of right ear pain that started 4-5 days ago.  It has ringing in the ears "like a car alarm".  Sound feels muffled.  Tried a decongestant with little results.  Denies vertigo.  Denies fever or chills.  Denies dizziness.  Denies nausea or vomiting.  No other significant symptoms.  HPI   Prior to Admission medications   Medication Sig Start Date End Date Taking? Authorizing Provider  aspirin 325 MG tablet Take 2 tablets (650 mg total) by mouth daily. For headaches 10/02/12  Yes Plotnikov, Georgina Quint, MD  augmented betamethasone dipropionate (DIPROLENE AF) 0.05 % cream Apply topically 2 (two) times daily. 04/10/16  Yes Plotnikov, Georgina Quint, MD  buPROPion (WELLBUTRIN XL) 300 MG 24 hr tablet Take 1 tablet (300 mg total) by mouth daily. 06/24/17  Yes Plotnikov, Georgina Quint, MD  meloxicam (MOBIC) 15 MG tablet TAKE 1 TABLET BY MOUTH  DAILY 03/28/17  Yes Plotnikov, Georgina Quint, MD  Multiple Vitamin (MULTIVITAMIN WITH MINERALS) TABS Take 1 tablet by mouth daily.   Yes [provider]  valACYclovir (VALTREX) 500 MG tablet TAKE 1 TABLET BY MOUTH  DAILY 03/28/17  Yes Plotnikov, Georgina Quint, MD  escitalopram (LEXAPRO) 5 MG tablet Take 1 tablet (5 mg total) by mouth daily. Patient not taking: Reported on 08/15/2017 06/24/17   Plotnikov, Georgina Quint, MD    No Known Allergies  Patient Active Problem List   Diagnosis Date Noted  . Fatigue due to depression 04/10/2016  . CTS (carpal tunnel syndrome) 03/10/2013  . Depression, reactive 03/10/2013  . OSA (obstructive sleep apnea) 11/24/2012  . 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 Obesity 10/02/2012  . Eczema 03/09/2012  . Axillary adenopathy 01/01/2012  . Palpitations 01/01/2012  . ANXIETY 11/20/2008  . PANIC DISORDER 09/21/2008  . DEPRESSION, SITUATIONAL, SEVERE 09/21/2008  . SHOULDER  PAIN 11/16/2007  . SINUSITIS 10/13/2007  . ALLERGIC RHINITIS 10/13/2007  . FATIGUE 10/13/2007  . ANEMIA, HX OF 10/13/2007    Past Medical History:  Diagnosis Date  . DVT (deep venous thrombosis) (HCC)    left calf  . Heart palpitations     No past surgical history on file.  Social History   Socioeconomic History  . Marital status: Legally Separated    Spouse name: Not on file  . Number of children: 3  . Years of education: Not on file  . Highest education level: Not on file  Occupational History  . Occupation: Charity fundraiser  Social Needs  . Financial resource strain: Not on file  . Food insecurity:    Worry: Not on file    Inability: Not on file  . Transportation needs:    Medical: Not on file    Non-medical: Not on file  Tobacco Use  . Smoking status: Never Smoker  . Smokeless tobacco: Never Used  Substance and Sexual Activity  . Alcohol use: No  . Drug use: No  . Sexual activity: Yes    Birth control/protection: Pill  Lifestyle  . Physical activity:    Days per week: Not on file    Minutes per session: Not on file  . Stress: Not on file  Relationships  . Social connections:    Talks on phone: Not on file    Gets together: Not on file    Attends religious service: Not on file  Active member of club or organization: Not on file    Attends meetings of clubs or organizations: Not on file    Relationship status: Not on file  . Intimate partner violence:    Fear of current or ex partner: Not on file    Emotionally abused: Not on file    Physically abused: Not on file    Forced sexual activity: Not on file  Other Topics Concern  . Not on file  Social History Narrative  . Not on file    Family History  Problem Relation Age of Onset  . Allergies Mother   . Cancer Mother        thyroid     ROS  Vitals:   08/15/17 1216  BP: 103/68  Pulse: 75  Resp: 17  Temp: 98.4 F (36.9 C)  SpO2: 98%    Physical Exam  Constitutional: She is oriented to person,  place, and time. She appears well-developed and well-nourished.  HENT:  Head: Normocephalic and atraumatic.  Nose: Nose normal.  Mouth/Throat: Oropharynx is clear and moist.  Right tympanic membrane looks dull with diminished light reflex.  Slightly tender external canal.  No earlobe tenderness.  No mastoid tenderness.  No outside rash visible. Left tympanic membrane: Within normal limits  Eyes: Pupils are equal, round, and reactive to light. Conjunctivae and EOM are normal.  Neck: Normal range of motion. Neck supple.  Cardiovascular: Normal rate and regular rhythm.  Pulmonary/Chest: Effort normal.  Musculoskeletal: Normal range of motion.  Lymphadenopathy:    She has no cervical adenopathy.  Neurological: She is alert and oriented to person, place, and time. No cranial nerve deficit or sensory deficit. She exhibits normal muscle tone.  Skin: Skin is warm and dry. Capillary refill takes less than 2 seconds. No rash noted.  Psychiatric: She has a normal mood and affect. Her behavior is normal.  Vitals reviewed.    ASSESSMENT & PLAN: Tonya Watkins was seen today for tinnitus.  Diagnoses and all orders for this visit:  Tinnitus of right ear -     azithromycin (ZITHROMAX) 250 MG tablet; Sig as indicated -     acetic acid-hydrocortisone (VOSOL-HC) OTIC solution; Place 3 drops into the left ear 3 (three) times daily.  Middle ear effusion, right -     azithromycin (ZITHROMAX) 250 MG tablet; Sig as indicated -     acetic acid-hydrocortisone (VOSOL-HC) OTIC solution; Place 3 drops into the left ear 3 (three) times daily.    Patient Instructions       IF you received an x-ray today, you will receive an invoice from [facility]. Please contact [facility] at [phone] with questions or concerns regarding your invoice.   IF you received labwork today, you will receive an invoice from [facility]. Please contact [facility] at [phone] with questions or concerns regarding  your invoice.   Our billing staff will not be able to assist you with questions regarding bills from these companies.  You will be contacted with the lab results as soon as they are available. The fastest way to get your results is to activate your My Chart account. Instructions are located on the last page of this paperwork. If you have not heard from us regarding the results in 2 weeks, please contact this office.      Otitis Media, Adult Otitis media is redness, soreness, and puffiness (swelling) in the space just behind your eardrum (middle ear). It may be caused by allergies or infection. It often happens  along with a cold. Follow these instructions at home:  Take your medicine as told. Finish it even if you start to feel better.  Only take over-the-counter or prescription medicines for pain, discomfort, or fever as told by your doctor.  Follow up with your doctor as told. Contact a doctor if:  You have otitis media only in one ear, or bleeding from your nose, or both.  You notice a lump on your neck.  You are not getting better in 3-5 days.  You feel worse instead of better. Get help right away if:  You have pain that is not helped with medicine.  You have puffiness, redness, or pain around your ear.  You get a stiff neck.  You cannot move part of your face (paralysis).  You notice that the bone behind your ear hurts when you touch it. This information is not intended to replace advice given to you by your health care provider. Make sure you discuss any questions you have with your health care provider. Document Released: 10/16/2007 Document Revised: 10/05/2015 Document Reviewed: 11/24/2012 Elsevier Interactive Patient Education  2017 Elsevier Inc.      Edwina Barth, MD [facility]

## 2017-09-05 ENCOUNTER — Other Ambulatory Visit: Payer: Self-pay | Admitting: Internal Medicine

## 2017-09-23 ENCOUNTER — Ambulatory Visit: Payer: 59 | Admitting: Internal Medicine

## 2017-09-29 ENCOUNTER — Encounter: Payer: Self-pay | Admitting: Internal Medicine

## 2017-09-29 ENCOUNTER — Other Ambulatory Visit (INDEPENDENT_AMBULATORY_CARE_PROVIDER_SITE_OTHER): Payer: 59

## 2017-09-29 ENCOUNTER — Ambulatory Visit (INDEPENDENT_AMBULATORY_CARE_PROVIDER_SITE_OTHER): Payer: 59 | Admitting: Internal Medicine

## 2017-09-29 DIAGNOSIS — H9311 Tinnitus, right ear: Secondary | ICD-10-CM

## 2017-09-29 DIAGNOSIS — B001 Herpesviral vesicular dermatitis: Secondary | ICD-10-CM | POA: Diagnosis not present

## 2017-09-29 DIAGNOSIS — E6609 Other obesity due to excess calories: Secondary | ICD-10-CM

## 2017-09-29 DIAGNOSIS — Z6835 Body mass index (BMI) 35.0-35.9, adult: Secondary | ICD-10-CM

## 2017-09-29 LAB — URINALYSIS, ROUTINE W REFLEX MICROSCOPIC
BILIRUBIN URINE: NEGATIVE
HGB URINE DIPSTICK: NEGATIVE
KETONES UR: 15 — AB
NITRITE: NEGATIVE
RBC / HPF: NONE SEEN (ref 0–?)
Specific Gravity, Urine: 1.015 (ref 1.000–1.030)
URINE GLUCOSE: NEGATIVE
UROBILINOGEN UA: 0.2 (ref 0.0–1.0)
pH: 7.5 (ref 5.0–8.0)

## 2017-09-29 LAB — TSH: TSH: 0.89 u[IU]/mL (ref 0.35–4.50)

## 2017-09-29 LAB — LIPID PANEL
CHOLESTEROL: 112 mg/dL (ref 0–200)
HDL: 41.1 mg/dL (ref >39.00)
LDL CALC: 63 mg/dL (ref 0–99)
NonHDL: 71.28
TRIGLYCERIDES: 40 mg/dL (ref 0.0–149.0)
Total CHOL/HDL Ratio: 3
VLDL: 8 mg/dL (ref 0.0–40.0)

## 2017-09-29 LAB — CBC WITH DIFFERENTIAL/PLATELET
BASOS PCT: 0.5 % (ref 0.0–3.0)
Basophils Absolute: 0 10*3/uL (ref 0.0–0.1)
EOS ABS: 0.5 10*3/uL (ref 0.0–0.7)
Eosinophils Relative: 8 % — ABNORMAL HIGH (ref 0.0–5.0)
HEMATOCRIT: 37.2 % (ref 36.0–46.0)
Hemoglobin: 12.7 g/dL (ref 12.0–15.0)
LYMPHS PCT: 21.8 % (ref 12.0–46.0)
Lymphs Abs: 1.4 10*3/uL (ref 0.7–4.0)
MCHC: 34.1 g/dL (ref 30.0–36.0)
MCV: 93.4 fl (ref 78.0–100.0)
Monocytes Absolute: 0.5 10*3/uL (ref 0.1–1.0)
Monocytes Relative: 7.8 % (ref 3.0–12.0)
NEUTROS ABS: 4 10*3/uL (ref 1.4–7.7)
Neutrophils Relative %: 61.9 % (ref 43.0–77.0)
PLATELETS: 254 10*3/uL (ref 150.0–400.0)
RBC: 3.99 Mil/uL (ref 3.87–5.11)
RDW: 13.4 % (ref 11.5–15.5)
WBC: 6.5 10*3/uL (ref 4.0–10.5)

## 2017-09-29 LAB — HEPATIC FUNCTION PANEL
ALBUMIN: 4.2 g/dL (ref 3.5–5.2)
ALK PHOS: 38 U/L — AB (ref 39–117)
ALT: 10 U/L (ref 0–35)
AST: 11 U/L (ref 0–37)
Bilirubin, Direct: 0.1 mg/dL (ref 0.0–0.3)
TOTAL PROTEIN: 7.3 g/dL (ref 6.0–8.3)
Total Bilirubin: 0.5 mg/dL (ref 0.2–1.2)

## 2017-09-29 LAB — BASIC METABOLIC PANEL
BUN: 7 mg/dL (ref 6–23)
CHLORIDE: 105 meq/L (ref 96–112)
CO2: 28 mEq/L (ref 19–32)
CREATININE: 0.73 mg/dL (ref 0.40–1.20)
Calcium: 9.3 mg/dL (ref 8.4–10.5)
GFR: 110.43 mL/min (ref >60.00)
Glucose, Bld: 89 mg/dL (ref 70–99)
POTASSIUM: 4.1 meq/L (ref 3.5–5.1)
SODIUM: 140 meq/L (ref 135–145)

## 2017-09-29 MED ORDER — VALACYCLOVIR HCL 500 MG PO TABS: 500.0000 mg | ORAL_TABLET | Freq: Every day | ORAL | 1 refills | Status: DC

## 2017-09-29 NOTE — Assessment & Plan Note (Signed)
2019 using ear phone on the L side only at work. Use B earphonres ENT if not better

## 2017-09-29 NOTE — Assessment & Plan Note (Signed)
Much better: lost 30 lbs on vegan diet

## 2017-09-29 NOTE — Progress Notes (Signed)
Subjective:  Patient ID: Tonya Watkins, female    DOB: [birth year]  Age: 47 y.o. MRN: [id number]  CC: No chief complaint on file.   HPI Tonya Watkins presents for obesity - lost 30 lbs on vegan diet. F/u de[pression - better. Not taking Lexapro F/u cold sores C/o ringing in the R ear  Outpatient Medications Prior to Visit  Medication Sig Dispense Refill  . aspirin 325 MG tablet Take 2 tablets (650 mg total) by mouth daily. For headaches 100 tablet 3  . augmented betamethasone dipropionate (DIPROLENE AF) 0.05 % cream Apply topically 2 (two) times daily. 50 g 3  . buPROPion (WELLBUTRIN XL) 300 MG 24 hr tablet Take 1 tablet (300 mg total) by mouth daily. 90 tablet 3  . escitalopram (LEXAPRO) 5 MG tablet Take 1 tablet (5 mg total) by mouth daily. 30 tablet 5  . meloxicam (MOBIC) 15 MG tablet TAKE 1 TABLET BY MOUTH  DAILY 90 tablet 1  . Multiple Vitamin (MULTIVITAMIN WITH MINERALS) TABS Take 1 tablet by mouth daily.    . valACYclovir (VALTREX) 500 MG tablet TAKE 1 TABLET BY MOUTH ONCE DAILY 30 tablet 0  . acetic acid-hydrocortisone (VOSOL-HC) OTIC solution Place 3 drops into the left ear 3 (three) times daily. 10 mL 1  . azithromycin (ZITHROMAX) 250 MG tablet Sig as indicated 6 tablet 0   No facility-administered medications prior to visit.     ROS Review of Systems  Constitutional: Negative for activity change, appetite change, chills, fatigue and unexpected weight change.  HENT: Negative for congestion, mouth sores and sinus pressure.   Eyes: Negative for visual disturbance.  Respiratory: Negative for cough and chest tightness.   Gastrointestinal: Negative for abdominal pain and nausea.  Genitourinary: Negative for difficulty urinating, frequency and vaginal pain.  Musculoskeletal: Negative for back pain and gait problem.  Skin: Negative for pallor and rash.  Neurological: Negative for dizziness, tremors, weakness, numbness and headaches.  Psychiatric/Behavioral: Negative for  confusion, sleep disturbance and suicidal ideas. The patient is not nervous/anxious.     Objective:  BP 106/64 (BP Location: Left Arm, Patient Position: Sitting, Cuff Size: Large)   Pulse 65   Temp 98.9 F (37.2 C) (Oral)   Ht 5' 2.5" (1.588 m)   Wt 190 lb (86.2 kg)   SpO2 98%   BMI 34.20 kg/m   BP Readings from Last 3 Encounters:  09/29/17 106/64  08/15/17 103/68  06/24/17 116/70    Wt Readings from Last 3 Encounters:  09/29/17 190 lb (86.2 kg)  08/15/17 206 lb (93.4 kg)  06/24/17 221 lb 1.9 oz (100.3 kg)    Physical Exam  Constitutional: She appears well-developed. No distress.  HENT:  Head: Normocephalic.  Right Ear: External ear normal.  Left Ear: External ear normal.  Nose: Nose normal.  Mouth/Throat: Oropharynx is clear and moist.  Eyes: Pupils are equal, round, and reactive to light. Conjunctivae are normal. Right eye exhibits no discharge. Left eye exhibits no discharge.  Neck: Normal range of motion. Neck supple. No JVD present. No tracheal deviation present. No thyromegaly present.  Cardiovascular: Normal rate, regular rhythm and normal heart sounds.  Pulmonary/Chest: No stridor. No respiratory distress. She has no wheezes.  Abdominal: Soft. Bowel sounds are normal. She exhibits no distension and no mass. There is no tenderness. There is no rebound and no guarding.  Musculoskeletal: She exhibits no edema or tenderness.  Lymphadenopathy:    She has no cervical adenopathy.  Neurological: She displays normal reflexes. No cranial  nerve deficit. She exhibits normal muscle tone. Coordination normal.  Skin: No rash noted. No erythema.  Psychiatric: She has a normal mood and affect. Her behavior is normal. Judgment and thought content normal.    Lab Results  Component Value Date   WBC 6.3 04/10/2016   HGB 12.9 04/10/2016   HCT 37.8 04/10/2016   PLT 241.0 04/10/2016   GLUCOSE 63 (L) 04/10/2016   CHOL 121 08/30/2014   TRIG 31.0 08/30/2014   HDL 52.30 08/30/2014    LDLCALC 63 08/30/2014   ALT 22 04/10/2016   AST 16 04/10/2016   NA 140 04/10/2016   K 4.2 04/10/2016   CL 105 04/10/2016   CREATININE 0.63 04/10/2016   BUN 9 04/10/2016   CO2 30 04/10/2016   TSH 0.69 04/10/2016   INR 2.1 07/03/2012    Ct Chest W Contrast  Result Date: 10/08/2012 *RADIOLOGY REPORT* Clinical Data: Follow-up axillary and subpectoral lymphadenopathy. CT CHEST WITH CONTRAST Technique:  Multidetector CT imaging of the chest was performed following the standard protocol during bolus administration of intravenous contrast. Contrast: 80mL OMNIPAQUE IOHEXOL 300 MG/ML  SOLN Comparison: 12/28/2011 Findings: Multiple small bilateral axillary and subpectoral lymph nodes are again seen measuring up to 10 mm, and are stable compared to prior exam.  No mediastinal or hilar lymphadenopathy identified. No adenopathy seen elsewhere within the thorax. No evidence of pleural or pericardial effusion.  Both lungs are clear.  No suspicious pulmonary nodules or masses are identified. No evidence of infiltrate or central endobronchial obstruction.  No evidence of chest wall mass or suspicious bone lesions. IMPRESSION: Stable shotty bilateral axillary and subpectoral lymph nodes measuring up to 10 mm.  No new or progressive lymphadenopathy or other significant abnormality. Original Report Authenticated By: Myles Rosenthal, M.D.    Assessment & Plan:   There are no diagnoses linked to this encounter. I have discontinued Tonya Watkins "Tonya Watkins"'s azithromycin and acetic acid-hydrocortisone. I am also having her maintain her multivitamin with minerals, aspirin, augmented betamethasone dipropionate, meloxicam, escitalopram, buPROPion, and valACYclovir.  No orders of the defined types were placed in this encounter.    Follow-up: No follow-ups on file.  Sonda Primes, MD

## 2017-09-29 NOTE — Assessment & Plan Note (Signed)
Valtrex prn  

## 2017-10-03 ENCOUNTER — Encounter: Payer: Self-pay | Admitting: Internal Medicine

## 2017-11-07 ENCOUNTER — Other Ambulatory Visit: Payer: Self-pay | Admitting: Internal Medicine

## 2018-04-02 ENCOUNTER — Encounter: Payer: 59 | Admitting: Internal Medicine

## 2018-04-06 ENCOUNTER — Ambulatory Visit: Payer: 59 | Admitting: Internal Medicine

## 2018-04-07 ENCOUNTER — Ambulatory Visit: Payer: 59 | Admitting: Internal Medicine

## 2018-05-20 ENCOUNTER — Ambulatory Visit: Payer: 59 | Admitting: Internal Medicine

## 2018-05-27 DIAGNOSIS — A6000 Herpesviral infection of urogenital system, unspecified: Secondary | ICD-10-CM | POA: Insufficient documentation

## 2018-05-27 DIAGNOSIS — M1990 Unspecified osteoarthritis, unspecified site: Secondary | ICD-10-CM | POA: Insufficient documentation

## 2018-05-28 DIAGNOSIS — I8290 Acute embolism and thrombosis of unspecified vein: Secondary | ICD-10-CM | POA: Insufficient documentation

## 2018-06-09 ENCOUNTER — Encounter: Payer: 59 | Admitting: Internal Medicine

## 2018-06-29 ENCOUNTER — Other Ambulatory Visit: Payer: Self-pay | Admitting: Internal Medicine

## 2018-06-30 ENCOUNTER — Other Ambulatory Visit (INDEPENDENT_AMBULATORY_CARE_PROVIDER_SITE_OTHER): Payer: 59

## 2018-06-30 ENCOUNTER — Ambulatory Visit (INDEPENDENT_AMBULATORY_CARE_PROVIDER_SITE_OTHER): Payer: 59 | Admitting: Internal Medicine

## 2018-06-30 ENCOUNTER — Encounter: Payer: Self-pay | Admitting: Internal Medicine

## 2018-06-30 VITALS — BP 116/78 | HR 66 | Temp 98.0°F | Ht 62.5 in | Wt 158.0 lb

## 2018-06-30 DIAGNOSIS — F329 Major depressive disorder, single episode, unspecified: Secondary | ICD-10-CM

## 2018-06-30 DIAGNOSIS — Z0000 Encounter for general adult medical examination without abnormal findings: Secondary | ICD-10-CM | POA: Diagnosis not present

## 2018-06-30 DIAGNOSIS — Z23 Encounter for immunization: Secondary | ICD-10-CM

## 2018-06-30 LAB — TSH: TSH: 1.42 u[IU]/mL (ref 0.35–4.50)

## 2018-06-30 LAB — CBC WITH DIFFERENTIAL/PLATELET
Basophils Absolute: 0 10*3/uL (ref 0.0–0.1)
Basophils Relative: 0.4 % (ref 0.0–3.0)
Eosinophils Absolute: 0.2 10*3/uL (ref 0.0–0.7)
Eosinophils Relative: 4.1 % (ref 0.0–5.0)
HEMATOCRIT: 37.2 % (ref 36.0–46.0)
HEMOGLOBIN: 12.4 g/dL (ref 12.0–15.0)
Lymphocytes Relative: 32.2 % (ref 12.0–46.0)
Lymphs Abs: 1.9 10*3/uL (ref 0.7–4.0)
MCHC: 33.3 g/dL (ref 30.0–36.0)
MCV: 94.4 fl (ref 78.0–100.0)
Monocytes Absolute: 0.5 10*3/uL (ref 0.1–1.0)
Monocytes Relative: 8.8 % (ref 3.0–12.0)
Neutro Abs: 3.2 10*3/uL (ref 1.4–7.7)
Neutrophils Relative %: 54.5 % (ref 43.0–77.0)
Platelets: 271 10*3/uL (ref 150.0–400.0)
RBC: 3.94 Mil/uL (ref 3.87–5.11)
RDW: 12.8 % (ref 11.5–15.5)
WBC: 6 10*3/uL (ref 4.0–10.5)

## 2018-06-30 LAB — URINALYSIS, ROUTINE W REFLEX MICROSCOPIC
Bilirubin Urine: NEGATIVE
Ketones, ur: NEGATIVE
Nitrite: NEGATIVE
Specific Gravity, Urine: <=1.005 — AB (ref 1.000–1.030)
URINE GLUCOSE: NEGATIVE
Urobilinogen, UA: 0.2 (ref 0.0–1.0)
pH: 6.5 (ref 5.0–8.0)

## 2018-06-30 LAB — BASIC METABOLIC PANEL
BUN: 8 mg/dL (ref 6–23)
CO2: 28 mEq/L (ref 19–32)
Calcium: 9.6 mg/dL (ref 8.4–10.5)
Chloride: 103 mEq/L (ref 96–112)
Creatinine, Ser: 0.7 mg/dL (ref 0.40–1.20)
GFR: 108.7 mL/min (ref >60.00)
Glucose, Bld: 82 mg/dL (ref 70–99)
POTASSIUM: 3.8 meq/L (ref 3.5–5.1)
SODIUM: 137 meq/L (ref 135–145)

## 2018-06-30 LAB — HEPATIC FUNCTION PANEL
ALT: 17 U/L (ref 0–35)
AST: 16 U/L (ref 0–37)
Albumin: 4.4 g/dL (ref 3.5–5.2)
Alkaline Phosphatase: 39 U/L (ref 39–117)
BILIRUBIN TOTAL: 0.5 mg/dL (ref 0.2–1.2)
Bilirubin, Direct: 0.1 mg/dL (ref 0.0–0.3)
Total Protein: 7.6 g/dL (ref 6.0–8.3)

## 2018-06-30 LAB — LIPID PANEL
Cholesterol: 118 mg/dL (ref 0–200)
HDL: 56.4 mg/dL (ref >39.00)
LDL Cholesterol: 53 mg/dL (ref 0–99)
NonHDL: 61.54
Total CHOL/HDL Ratio: 2
Triglycerides: 45 mg/dL (ref 0.0–149.0)
VLDL: 9 mg/dL (ref 0.0–40.0)

## 2018-06-30 LAB — VITAMIN B12: Vitamin B-12: 1350 pg/mL — ABNORMAL HIGH (ref 211–911)

## 2018-06-30 LAB — VITAMIN D 25 HYDROXY (VIT D DEFICIENCY, FRACTURES): VITD: 48.47 ng/mL (ref 30.00–100.00)

## 2018-06-30 NOTE — Progress Notes (Signed)
Subjective:  Patient ID: Tonya Watkins, female    DOB: February 08, 1971  Age: 48 y.o. MRN: [id number]  CC: No chief complaint on file.   HPI Tonya Watkins presents for a well exam. She is a vegan - lost wt on diet  Outpatient Medications Prior to Visit  Medication Sig Dispense Refill  . aspirin 325 MG tablet Take 2 tablets (650 mg total) by mouth daily. For headaches 100 tablet 3  . buPROPion (WELLBUTRIN XL) 300 MG 24 hr tablet Take 1 tablet (300 mg total) by mouth daily. 90 tablet 3  . Multiple Vitamin (MULTIVITAMIN WITH MINERALS) TABS Take 1 tablet by mouth daily.    . valACYclovir (VALTREX) 500 MG tablet TAKE 1 TABLET BY MOUTH ONCE DAILY 90 tablet 0  . augmented betamethasone dipropionate (DIPROLENE AF) 0.05 % cream Apply topically 2 (two) times daily. (Patient not taking: Reported on 06/30/2018) 50 g 3  . meloxicam (MOBIC) 15 MG tablet TAKE 1 TABLET BY MOUTH  DAILY (Patient not taking: Reported on 06/30/2018) 90 tablet 1  . valACYclovir (VALTREX) 500 MG tablet Take 1 tablet (500 mg total) by mouth daily. 30 tablet 1   No facility-administered medications prior to visit.     ROS: Review of Systems  Constitutional: Negative for activity change, appetite change, chills, fatigue and unexpected weight change.  HENT: Negative for congestion, mouth sores and sinus pressure.   Eyes: Negative for visual disturbance.  Respiratory: Negative for cough and chest tightness.   Gastrointestinal: Negative for abdominal pain and nausea.  Genitourinary: Negative for difficulty urinating, frequency and vaginal pain.  Musculoskeletal: Negative for back pain and gait problem.  Skin: Negative for pallor and rash.  Neurological: Negative for dizziness, tremors, weakness, numbness and headaches.  Psychiatric/Behavioral: Negative for confusion, sleep disturbance and suicidal ideas.    Objective:  BP 116/78 (BP Location: Left Arm, Patient Position: Sitting, Cuff Size: Normal)   Pulse 66   Temp 98 F (36.7  C) (Oral)   Ht 5' 2.5" (1.588 m)   Wt 158 lb (71.7 kg)   SpO2 99%   BMI 28.44 kg/m   BP Readings from Last 3 Encounters:  06/30/18 116/78  09/29/17 106/64  08/15/17 103/68    Wt Readings from Last 3 Encounters:  06/30/18 158 lb (71.7 kg)  09/29/17 190 lb (86.2 kg)  08/15/17 206 lb (93.4 kg)    Physical Exam Constitutional:      General: She is not in acute distress.    Appearance: She is well-developed.  HENT:     Head: Normocephalic.     Right Ear: External ear normal.     Left Ear: External ear normal.     Nose: Nose normal.  Eyes:     General:        Right eye: No discharge.        Left eye: No discharge.     Conjunctiva/sclera: Conjunctivae normal.     Pupils: Pupils are equal, round, and reactive to light.  Neck:     Musculoskeletal: Normal range of motion and neck supple.     Thyroid: No thyromegaly.     Vascular: No JVD.     Trachea: No tracheal deviation.  Cardiovascular:     Rate and Rhythm: Normal rate and regular rhythm.     Heart sounds: Normal heart sounds.  Pulmonary:     Effort: No respiratory distress.     Breath sounds: No stridor. No wheezing.  Abdominal:     General: Bowel sounds  are normal. There is no distension.     Palpations: Abdomen is soft. There is no mass.     Tenderness: There is no abdominal tenderness. There is no guarding or rebound.  Musculoskeletal:        General: No tenderness.  Lymphadenopathy:     Cervical: No cervical adenopathy.  Skin:    Findings: No erythema or rash.  Neurological:     Cranial Nerves: No cranial nerve deficit.     Motor: No abnormal muscle tone.     Coordination: Coordination normal.     Deep Tendon Reflexes: Reflexes normal.  Psychiatric:        Behavior: Behavior normal.        Thought Content: Thought content normal.        Judgment: Judgment normal.     Lab Results  Component Value Date   WBC 6.5 09/29/2017   HGB 12.7 09/29/2017   HCT 37.2 09/29/2017   PLT 254.0 09/29/2017    GLUCOSE 89 09/29/2017   CHOL 112 09/29/2017   TRIG 40.0 09/29/2017   HDL 41.10 09/29/2017   LDLCALC 63 09/29/2017   ALT 10 09/29/2017   AST 11 09/29/2017   NA 140 09/29/2017   K 4.1 09/29/2017   CL 105 09/29/2017   CREATININE 0.73 09/29/2017   BUN 7 09/29/2017   CO2 28 09/29/2017   TSH 0.89 09/29/2017   INR 2.1 07/03/2012    Ct Chest W Contrast  Result Date: 10/08/2012 *RADIOLOGY REPORT* Clinical Data: Follow-up axillary and subpectoral lymphadenopathy. CT CHEST WITH CONTRAST Technique:  Multidetector CT imaging of the chest was performed following the standard protocol during bolus administration of intravenous contrast. Contrast: 53mL OMNIPAQUE IOHEXOL 300 MG/ML  SOLN Comparison: 12/28/2011 Findings: Multiple small bilateral axillary and subpectoral lymph nodes are again seen measuring up to 10 mm, and are stable compared to prior exam.  No mediastinal or hilar lymphadenopathy identified. No adenopathy seen elsewhere within the thorax. No evidence of pleural or pericardial effusion.  Both lungs are clear.  No suspicious pulmonary nodules or masses are identified. No evidence of infiltrate or central endobronchial obstruction.  No evidence of chest wall mass or suspicious bone lesions. IMPRESSION: Stable shotty bilateral axillary and subpectoral lymph nodes measuring up to 10 mm.  No new or progressive lymphadenopathy or other significant abnormality. Original Report Authenticated By: Myles Rosenthal, M.D.    Assessment & Plan:   There are no diagnoses linked to this encounter.   No orders of the defined types were placed in this encounter.    Follow-up: No follow-ups on file.  Sonda Primes, MD

## 2018-06-30 NOTE — Addendum Note (Signed)
Addended by: Scarlett Presto on: 06/30/2018 04:48 PM   Modules accepted: Orders

## 2018-06-30 NOTE — Assessment & Plan Note (Signed)
We discussed age appropriate health related issues, including available/recomended screening tests and vaccinations. We discussed a need for adhering to healthy diet and exercise. Labs were ordered to be later reviewed . All questions were answered. Vegan since  2019 Labs

## 2018-06-30 NOTE — Assessment & Plan Note (Signed)
-    Wellbutrin XL

## 2018-07-01 ENCOUNTER — Other Ambulatory Visit: Payer: Self-pay | Admitting: Internal Medicine

## 2018-07-02 ENCOUNTER — Other Ambulatory Visit: Payer: Self-pay | Admitting: Internal Medicine

## 2018-07-02 MED ORDER — BUPROPION HCL ER (XL) 300 MG PO TB24: 300.0000 mg | ORAL_TABLET | Freq: Every day | ORAL | 4 refills | Status: DC

## 2018-07-02 NOTE — Telephone Encounter (Signed)
Requested Prescriptions  Pending Prescriptions Disp Refills  . buPROPion (WELLBUTRIN XL) 300 MG 24 hr tablet 90 tablet 4    Sig: Take 1 tablet (300 mg total) by mouth daily.     Psychiatry: Antidepressants - bupropion Passed - 07/02/2018 11:42 AM      Passed - Completed PHQ-2 or PHQ-9 in the last 360 days.      Passed - Last BP in normal range    BP Readings from Last 1 Encounters:  06/30/18 116/78         Passed - Valid encounter within last 6 months    Recent Outpatient Visits          2 days ago Well adult exam   [facility] Plotnikov, Georgina Quint, MD   9 months ago Class 2 obesity due to excess calories without serious comorbidity with body mass index (BMI) of 35.0 to 35.9 in adult   [facility] Plot[facility], Georgina Quint, MD   10 months ago Tinnitus of right ear   [facility], [place], [place]   1 year ago Class 2 obesity due to excess calories without serious comorbidity with body mass index (BMI) of 35.0 to 35.9 in adult   [facility], Georgina Quint, MD   1 year ago Need for influenza vaccination   [facility] [facility], Georgina Quint, MD      Future Appointments            In 1 year Plotnikov, Georgina Quint, MD [facility], [place]

## 2018-07-02 NOTE — Telephone Encounter (Signed)
Copied from CRM [id number]. Topic: Quick Communication - Rx Refill/Question >> Jul 02, 2018 11:23 AM Fanny Bien wrote: Medication: buPROPion (WELLBUTRIN XL) 300 MG 24 hr tablet [[id number]]   Has the patient contacted their pharmacy? No Preferred Pharmacy (with phone number or street name): [facility] [address], [place] - [address] [phone] (Phone) [phone] (Fax)   Agent: Please be advised that RX refills may take up to 3 business days. We ask that you follow-up with your pharmacy.

## 2018-08-05 ENCOUNTER — Other Ambulatory Visit: Payer: Self-pay | Admitting: Internal Medicine

## 2019-07-05 ENCOUNTER — Encounter: Payer: 59 | Admitting: Internal Medicine

## 2019-07-05 DIAGNOSIS — Z0289 Encounter for other administrative examinations: Secondary | ICD-10-CM

## 2019-07-27 ENCOUNTER — Other Ambulatory Visit: Payer: Self-pay | Admitting: Internal Medicine

## 2019-10-18 ENCOUNTER — Other Ambulatory Visit: Payer: Self-pay | Admitting: Internal Medicine

## 2019-12-16 ENCOUNTER — Ambulatory Visit (INDEPENDENT_AMBULATORY_CARE_PROVIDER_SITE_OTHER): Payer: 59 | Admitting: Internal Medicine

## 2019-12-16 ENCOUNTER — Encounter: Payer: Self-pay | Admitting: Internal Medicine

## 2019-12-16 ENCOUNTER — Other Ambulatory Visit: Payer: Self-pay

## 2019-12-16 VITALS — BP 138/76 | HR 75 | Temp 98.6°F | Ht 62.5 in | Wt 179.0 lb

## 2019-12-16 DIAGNOSIS — Z6835 Body mass index (BMI) 35.0-35.9, adult: Secondary | ICD-10-CM

## 2019-12-16 DIAGNOSIS — R5383 Other fatigue: Secondary | ICD-10-CM

## 2019-12-16 DIAGNOSIS — Z0000 Encounter for general adult medical examination without abnormal findings: Secondary | ICD-10-CM

## 2019-12-16 DIAGNOSIS — Z23 Encounter for immunization: Secondary | ICD-10-CM

## 2019-12-16 DIAGNOSIS — F329 Major depressive disorder, single episode, unspecified: Secondary | ICD-10-CM

## 2019-12-16 DIAGNOSIS — G5603 Carpal tunnel syndrome, bilateral upper limbs: Secondary | ICD-10-CM

## 2019-12-16 DIAGNOSIS — E6609 Other obesity due to excess calories: Secondary | ICD-10-CM

## 2019-12-16 DIAGNOSIS — F410 Panic disorder [episodic paroxysmal anxiety] without agoraphobia: Secondary | ICD-10-CM

## 2019-12-16 DIAGNOSIS — F32A Depression, unspecified: Secondary | ICD-10-CM

## 2019-12-16 LAB — COMPLETE METABOLIC PANEL WITH GFR
AG Ratio: 1.5 (calc) (ref 1.0–2.5)
ALT: 20 U/L (ref 6–29)
AST: 19 U/L (ref 10–35)
Albumin: 4.4 g/dL (ref 3.6–5.1)
Alkaline phosphatase (APISO): 45 U/L (ref 31–125)
BUN: 11 mg/dL (ref 7–25)
CO2: 26 mmol/L (ref 20–32)
Calcium: 9.3 mg/dL (ref 8.6–10.2)
Chloride: 103 mmol/L (ref 98–110)
Creat: 0.82 mg/dL (ref 0.50–1.10)
GFR, Est African American: 98 mL/min/{1.73_m2} (ref >=60)
GFR, Est Non African American: 85 mL/min/{1.73_m2} (ref >=60)
Globulin: 3 g/dL (calc) (ref 1.9–3.7)
Glucose, Bld: 78 mg/dL (ref 65–99)
Potassium: 4.1 mmol/L (ref 3.5–5.3)
Sodium: 137 mmol/L (ref 135–146)
Total Bilirubin: 0.6 mg/dL (ref 0.2–1.2)
Total Protein: 7.4 g/dL (ref 6.1–8.1)

## 2019-12-16 MED ORDER — CLONAZEPAM 0.25 MG PO TBDP: 0.2500 mg | ORAL_TABLET | Freq: Two times a day (BID) | ORAL | 1 refills | Status: DC | PRN

## 2019-12-16 MED ORDER — SAXENDA 18 MG/3ML ~~LOC~~ SOPN: 3.0000 mg | PEN_INJECTOR | Freq: Every day | SUBCUTANEOUS | 5 refills | Status: DC

## 2019-12-16 NOTE — Assessment & Plan Note (Signed)
Clonazepam prn 

## 2019-12-16 NOTE — Assessment & Plan Note (Signed)
Wellbutrin XL 300 mg/d

## 2019-12-16 NOTE — Addendum Note (Signed)
Addended by: Nena Alexander C on: 12/16/2019 05:01 PM   Modules accepted: Orders

## 2019-12-16 NOTE — Progress Notes (Signed)
Subjective:  Patient ID: Tonya Watkins, female    DOB: 01/15/1971  Age: 49 y.o. MRN: [id number]  CC: No chief complaint on file.   HPI Tonya Watkins presents for a well exam C/o wt gain, elevated BP SBP was in 130s C/o anxiety, stress w/dtr - panic attacks  Outpatient Medications Prior to Visit  Medication Sig Dispense Refill   buPROPion (WELLBUTRIN XL) 300 MG 24 hr tablet Take 1 tablet by mouth once daily 90 tablet 5   Multiple Vitamin (MULTIVITAMIN WITH MINERALS) TABS Take 1 tablet by mouth daily.     valACYclovir (VALTREX) 500 MG tablet Take 1 tablet by mouth once daily 90 tablet 3   aspirin 325 MG tablet Take 2 tablets (650 mg total) by mouth daily. For headaches (Patient not taking: Reported on 12/16/2019) 100 tablet 3   No facility-administered medications prior to visit.    ROS: Review of Systems  Constitutional: Positive for unexpected weight change. Negative for activity change, appetite change, chills and fatigue.  HENT: Negative for congestion, mouth sores and sinus pressure.   Eyes: Negative for visual disturbance.  Respiratory: Negative for cough and chest tightness.   Gastrointestinal: Negative for abdominal pain and nausea.  Genitourinary: Negative for difficulty urinating, frequency and vaginal pain.  Musculoskeletal: Negative for back pain and gait problem.  Skin: Negative for pallor and rash.  Neurological: Negative for dizziness, tremors, weakness, numbness and headaches.  Psychiatric/Behavioral: Negative for confusion, sleep disturbance and suicidal ideas. The patient is nervous/anxious.     Objective:  BP 138/76 (BP Location: Right Arm, Patient Position: Sitting, Cuff Size: Normal)    Pulse 75    Temp 98.6 F (37 C) (Oral)    Ht 5' 2.5" (1.588 m)    Wt 179 lb (81.2 kg)    SpO2 99%    BMI 32.22 kg/m   BP Readings from Last 3 Encounters:  12/16/19 138/76  06/30/18 116/78  09/29/17 106/64    Wt Readings from Last 3 Encounters:  12/16/19 179 lb  (81.2 kg)  06/30/18 158 lb (71.7 kg)  09/29/17 190 lb (86.2 kg)    Physical Exam Constitutional:      General: She is not in acute distress.    Appearance: She is well-developed.  HENT:     Head: Normocephalic.     Right Ear: External ear normal.     Left Ear: External ear normal.     Nose: Nose normal.  Eyes:     General:        Right eye: No discharge.        Left eye: No discharge.     Conjunctiva/sclera: Conjunctivae normal.     Pupils: Pupils are equal, round, and reactive to light.  Neck:     Thyroid: No thyromegaly.     Vascular: No JVD.     Trachea: No tracheal deviation.  Cardiovascular:     Rate and Rhythm: Normal rate and regular rhythm.     Heart sounds: Normal heart sounds.  Pulmonary:     Effort: No respiratory distress.     Breath sounds: No stridor. No wheezing.  Abdominal:     General: Bowel sounds are normal. There is no distension.     Palpations: Abdomen is soft. There is no mass.     Tenderness: There is no abdominal tenderness. There is no guarding or rebound.  Musculoskeletal:        General: No tenderness.     Cervical back: Normal range of motion  and neck supple.  Lymphadenopathy:     Cervical: No cervical adenopathy.  Skin:    Findings: No erythema or rash.  Neurological:     Mental Status: She is oriented to person, place, and time.     Cranial Nerves: No cranial nerve deficit.     Motor: No abnormal muscle tone.     Coordination: Coordination normal.     Deep Tendon Reflexes: Reflexes normal.  Psychiatric:        Behavior: Behavior normal.        Thought Content: Thought content normal.        Judgment: Judgment normal.     Lab Results  Component Value Date   WBC 6.0 06/30/2018   HGB 12.4 06/30/2018   HCT 37.2 06/30/2018   PLT 271.0 06/30/2018   GLUCOSE 82 06/30/2018   CHOL 118 06/30/2018   TRIG 45.0 06/30/2018   HDL 56.40 06/30/2018   LDLCALC 53 06/30/2018   ALT 17 06/30/2018   AST 16 06/30/2018   NA 137 06/30/2018   K  3.8 06/30/2018   CL 103 06/30/2018   CREATININE 0.70 06/30/2018   BUN 8 06/30/2018   CO2 28 06/30/2018   TSH 1.42 06/30/2018   INR 2.1 07/03/2012    CT Chest W Contrast  Result Date: 10/08/2012 *RADIOLOGY REPORT* Clinical Data: Follow-up axillary and subpectoral lymphadenopathy. CT CHEST WITH CONTRAST Technique:  Multidetector CT imaging of the chest was performed following the standard protocol during bolus administration of intravenous contrast. Contrast: 55mL OMNIPAQUE IOHEXOL 300 MG/ML  SOLN Comparison: 12/28/2011 Findings: Multiple small bilateral axillary and subpectoral lymph nodes are again seen measuring up to 10 mm, and are stable compared to prior exam.  No mediastinal or hilar lymphadenopathy identified. No adenopathy seen elsewhere within the thorax. No evidence of pleural or pericardial effusion.  Both lungs are clear.  No suspicious pulmonary nodules or masses are identified. No evidence of infiltrate or central endobronchial obstruction.  No evidence of chest wall mass or suspicious bone lesions. IMPRESSION: Stable shotty bilateral axillary and subpectoral lymph nodes measuring up to 10 mm.  No new or progressive lymphadenopathy or other significant abnormality. Original Report Authenticated By: Myles Rosenthal, M.D.    Assessment & Plan:    Sonda Primes, MD

## 2019-12-16 NOTE — Assessment & Plan Note (Signed)
Discussed Saxenda Rx

## 2019-12-16 NOTE — Assessment & Plan Note (Addendum)
  We discussed age appropriate health related issues, including available/recomended screening tests and vaccinations. Labs were ordered to be later reviewed . All questions were answered. We discussed one or more of the following - seat belt use, use of sunscreen/sun exposure exercise, safe sex, fall risk reduction, second hand smoke exposure, firearm use and storage, seat belt use, a need for adhering to healthy diet and exercise. Labs were ordered or discussed if they are available. All questions were answered. Vegan since  2019 - Vit D, MVI GYN q 12 mo mammo periodically

## 2019-12-16 NOTE — Patient Instructions (Addendum)
Liraglutide injection (Weight Management) What is this medicine? LIRAGLUTIDE (LIR a GLOO tide) is used to help people lose weight and maintain weight loss. It is used with a reduced-calorie diet and exercise. This medicine may be used for other purposes; ask your health care provider or pharmacist if you have questions. COMMON BRAND NAME(S): Saxenda What should I tell my health care provider before I take this medicine? They need to know if you have any of these conditions:  endocrine tumors (MEN 2) or if someone in your family had these tumors  gallbladder disease  high cholesterol  history of alcohol abuse problem  history of pancreatitis  kidney disease or if you are on dialysis  liver disease  previous swelling of the tongue, face, or lips with difficulty breathing, difficulty swallowing, hoarseness, or tightening of the throat  stomach problems  suicidal thoughts, plans, or attempt; a previous suicide attempt by you or a family member  thyroid cancer or if someone in your family had thyroid cancer  an unusual or allergic reaction to liraglutide, other medicines, foods, dyes, or preservatives  pregnant or trying to get pregnant  breast-feeding How should I use this medicine? This medicine is for injection under the skin of your upper leg, stomach area, or upper arm. You will be taught how to prepare and give this medicine. Use exactly as directed. Take your medicine at regular intervals. Do not take it more often than directed. This drug comes with INSTRUCTIONS FOR USE. Ask your pharmacist for directions on how to use this drug. Read the information carefully. Talk to your pharmacist or health care provider if you have questions. It is important that you put your used needles and syringes in a special sharps container. Do not put them in a trash can. If you do not have a sharps container, call your pharmacist or healthcare provider to get one. A special MedGuide will be  given to you by the pharmacist with each prescription and refill. Be sure to read this information carefully each time. Talk to your pediatrician regarding the use of this medicine in children. Special care may be needed. Overdosage: If you think you have taken too much of this medicine contact a poison control center or emergency room at once. NOTE: This medicine is only for you. Do not share this medicine with others. What if I miss a dose? If you miss a dose, take it as soon as you can. If it is almost time for your next dose, take only that dose. Do not take double or extra doses. If you miss your dose for 3 days or more, call your doctor or health care professional to talk about how to restart this medicine. What may interact with this medicine?  insulin and other medicines for diabetes This list may not describe all possible interactions. Give your health care provider a list of all the medicines, herbs, non-prescription drugs, or dietary supplements you use. Also tell them if you smoke, drink alcohol, or use illegal drugs. Some items may interact with your medicine. What should I watch for while using this medicine? Visit your doctor or health care professional for regular checks on your progress. Drink plenty of fluids while taking this medicine. Check with your doctor or health care professional if you get an attack of severe diarrhea, nausea, and vomiting. The loss of too much body fluid can make it dangerous for you to take this medicine. This medicine may affect blood sugar levels. Ask your healthcare   provider if changes in diet or medicines are needed if you have diabetes. Patients and their families should watch out for worsening depression or thoughts of suicide. Also watch out for sudden changes in feelings such as feeling anxious, agitated, panicky, irritable, hostile, aggressive, impulsive, severely restless, overly excited and hyperactive, or not being able to sleep. If this happens,  especially at the beginning of treatment or after a change in dose, call your health care professional. Women should inform their health care provider if they wish to become pregnant or think they might be pregnant. Losing weight while pregnant is not advised and may cause harm to the unborn child. Talk to your health care provider for more information. What side effects may I notice from receiving this medicine? Side effects that you should report to your doctor or health care professional as soon as possible:  allergic reactions like skin rash, itching or hives, swelling of the face, lips, or tongue  breathing problems  diarrhea that continues or is severe  lump or swelling on the neck  severe nausea  signs and symptoms of infection like fever or chills; cough; sore throat; pain or trouble passing urine  signs and symptoms of low blood sugar such as feeling anxious; confusion; dizziness; increased hunger; unusually weak or tired; increased sweating; shakiness; cold, clammy skin; irritable; headache; blurred vision; fast heartbeat; loss of consciousness  signs and symptoms of kidney injury like trouble passing urine or change in the amount of urine  trouble swallowing  unusual stomach upset or pain  vomiting Side effects that usually do not require medical attention (report to your doctor or health care professional if they continue or are bothersome):  constipation  decreased appetite  diarrhea  fatigue  headache  nausea  pain, redness, or irritation at site where injected  stomach upset  stuffy or runny nose This list may not describe all possible side effects. Call your doctor for medical advice about side effects. You may report side effects to FDA at 1-800-FDA-1088. Where should I keep my medicine? Keep out of the reach of children. Store unopened pen in a refrigerator between 2 and 8 degrees C (36 and 46 degrees F). Do not freeze or use if the medicine has been  frozen. Protect from light and excessive heat. After you first use the pen, it can be stored at room temperature between 15 and 30 degrees C (59 and 86 degrees F) or in a refrigerator. Throw away your used pen after 30 days or after the expiration date, whichever comes first. Do not store your pen with the needle attached. If the needle is left on, medicine may leak from the pen. NOTE: This sheet is a summary. It may not cover all possible information. If you have questions about this medicine, talk to your doctor, pharmacist, or health care provider.  2020 Elsevier/Gold Standard (2019-03-04 21:16:59)    These suggestions will probably help you to improve your metabolism if you are not overweight and to lose weight if you are overweight: 1.  Reduce your consumption of sugars and starches.  Eliminate high fructose corn syrup from your diet.  Reduce your consumption of processed foods.  For desserts try to have seasonal fruits, berries, nuts, cheeses or dark chocolate with more than 70% cacao. 2.  Do not snack 3.  You do not have to eat breakfast.  If you choose to have breakfast-eat plain greek yogurt, eggs, oatmeal (without sugar) 4.  Drink water, freshly brewed unsweetened tea (  green, black or herbal) or coffee.  Do not drink sodas including diet sodas , juices, beverages sweetened with artificial sweeteners. 5.  Reduce your consumption of refined grains. 6.  Avoid protein drinks such as Optifast, Slim fast etc. Eat chicken, fish, meat, dairy and beans for your sources of protein 7.  Natural unprocessed fats like cold pressed virgin olive oil, butter, coconut oil are good for you.  Eat avocados 8.  Increase your consumption of fiber.  Fruits, berries, vegetables, whole grains, flaxseeds, Chia seeds, beans, popcorn, nuts, oatmeal are good sources of fiber 9.  Use vinegar in your diet, i.e. apple cider vinegar, red wine or balsamic vinegar 10.  You can try fasting.  For example you can skip  breakfast and lunch every other day (24-hour fast) 11.  Stress reduction, good night sleep, relaxation, meditation, yoga and other physical activity is likely to help you to maintain low weight too. 12.  If you drink alcohol, limit your alcohol intake to no more than 2 drinks a day.  

## 2019-12-16 NOTE — Addendum Note (Signed)
Addended by: Merrilyn Puma on: 12/16/2019 04:14 PM   Modules accepted: Orders

## 2019-12-16 NOTE — Assessment & Plan Note (Signed)
Wellbutrin Clonazepam

## 2019-12-17 LAB — CBC WITH DIFFERENTIAL/PLATELET
Absolute Monocytes: 697 cells/uL (ref 200–950)
Basophils Absolute: 20 cells/uL (ref 0–200)
Basophils Relative: 0.3 %
Eosinophils Absolute: 94 cells/uL (ref 15–500)
Eosinophils Relative: 1.4 %
HCT: 37.6 % (ref 35.0–45.0)
Hemoglobin: 12.7 g/dL (ref 11.7–15.5)
Lymphs Abs: 2003 cells/uL (ref 850–3900)
MCH: 32.3 pg (ref 27.0–33.0)
MCHC: 33.8 g/dL (ref 32.0–36.0)
MCV: 95.7 fL (ref 80.0–100.0)
MPV: 11.2 fL (ref 7.5–12.5)
Monocytes Relative: 10.4 %
Neutro Abs: 3886 cells/uL (ref 1500–7800)
Neutrophils Relative %: 58 %
Platelets: 273 10*3/uL (ref 140–400)
RBC: 3.93 10*6/uL (ref 3.80–5.10)
RDW: 12 % (ref 11.0–15.0)
Total Lymphocyte: 29.9 %
WBC: 6.7 10*3/uL (ref 3.8–10.8)

## 2019-12-17 LAB — TSH: TSH: 1.27 mIU/L

## 2019-12-17 LAB — URINALYSIS
Bilirubin Urine: NEGATIVE
Glucose, UA: NEGATIVE
Hgb urine dipstick: NEGATIVE
Nitrite: NEGATIVE
Protein, ur: NEGATIVE
Specific Gravity, Urine: 1.028 (ref 1.001–1.03)
pH: 6 (ref 5.0–8.0)

## 2019-12-17 LAB — LIPID PANEL
Cholesterol: 151 mg/dL (ref <200)
HDL: 72 mg/dL (ref >=50)
LDL Cholesterol (Calc): 66 mg/dL (calc)
Non-HDL Cholesterol (Calc): 79 mg/dL (calc) (ref <130)
Total CHOL/HDL Ratio: 2.1 (calc) (ref <5.0)
Triglycerides: 46 mg/dL (ref <150)

## 2019-12-17 LAB — T4, FREE: Free T4: 1.3 ng/dL (ref 0.8–1.8)

## 2019-12-17 LAB — VITAMIN D 25 HYDROXY (VIT D DEFICIENCY, FRACTURES): Vit D, 25-Hydroxy: 30 ng/mL (ref 30–100)

## 2019-12-20 ENCOUNTER — Telehealth: Payer: Self-pay

## 2019-12-20 NOTE — Telephone Encounter (Signed)
Saxenda PA was denied. Please advise if you want to change medication

## 2019-12-20 NOTE — Telephone Encounter (Signed)
Key: [id number]

## 2019-12-21 NOTE — Telephone Encounter (Signed)
Do they give it a covered alternative? Thx

## 2019-12-22 NOTE — Telephone Encounter (Signed)
The medication and similar are exclusions from the plan. I do not think we could anything covered. If you think she would qualify for patient assistance, we can go that route.

## 2019-12-23 NOTE — Telephone Encounter (Signed)
Thank you :)

## 2020-01-13 ENCOUNTER — Telehealth: Payer: Self-pay

## 2020-03-06 ENCOUNTER — Other Ambulatory Visit: Payer: Self-pay | Admitting: Internal Medicine

## 2020-03-06 MED ORDER — PHENTERMINE HCL 37.5 MG PO TABS: 37.5000 mg | ORAL_TABLET | Freq: Every day | ORAL | 2 refills | Status: DC

## 2020-03-20 ENCOUNTER — Ambulatory Visit: Payer: 59 | Admitting: Internal Medicine

## 2020-03-30 ENCOUNTER — Ambulatory Visit: Payer: 59 | Admitting: Internal Medicine

## 2020-03-30 DIAGNOSIS — Z0289 Encounter for other administrative examinations: Secondary | ICD-10-CM

## 2020-05-26 ENCOUNTER — Encounter: Payer: Self-pay | Admitting: Internal Medicine

## 2020-05-26 ENCOUNTER — Ambulatory Visit (INDEPENDENT_AMBULATORY_CARE_PROVIDER_SITE_OTHER): Payer: 59 | Admitting: Internal Medicine

## 2020-05-26 ENCOUNTER — Other Ambulatory Visit: Payer: Self-pay

## 2020-05-26 DIAGNOSIS — Z6835 Body mass index (BMI) 35.0-35.9, adult: Secondary | ICD-10-CM | POA: Diagnosis not present

## 2020-05-26 DIAGNOSIS — I87009 Postthrombotic syndrome without complications of unspecified extremity: Secondary | ICD-10-CM

## 2020-05-26 DIAGNOSIS — G4733 Obstructive sleep apnea (adult) (pediatric): Secondary | ICD-10-CM

## 2020-05-26 DIAGNOSIS — Z23 Encounter for immunization: Secondary | ICD-10-CM | POA: Diagnosis not present

## 2020-05-26 DIAGNOSIS — E6609 Other obesity due to excess calories: Secondary | ICD-10-CM | POA: Diagnosis not present

## 2020-05-26 MED ORDER — PHENTERMINE HCL 37.5 MG PO TABS: 37.5000 mg | ORAL_TABLET | Freq: Every day | ORAL | 2 refills | Status: DC

## 2020-05-26 MED ORDER — CLONAZEPAM 0.25 MG PO TBDP: 0.2500 mg | ORAL_TABLET | Freq: Two times a day (BID) | ORAL | 1 refills | Status: DC | PRN

## 2020-05-26 NOTE — Assessment & Plan Note (Addendum)
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

## 2020-05-26 NOTE — Assessment & Plan Note (Addendum)
Problem worse.  The patient is not using CPAP.  Discussed.

## 2020-05-26 NOTE — Assessment & Plan Note (Addendum)
A vegetarian Ref to Dr Dalbert Garnet. Low carb diet

## 2020-05-26 NOTE — Progress Notes (Signed)
Subjective:  Patient ID: Tonya Watkins, female    DOB: 1970/08/04  Age: 50 y.o. MRN: [id number]  CC: Follow-up (6 month f/u- Requesting health form for work to be filled out)   HPI [name] presents for wt gain Phentermine 1 tab caused palpitations C/o stress w/dtr's illness in Jan 2022.  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 aspirin 325 MG tablet Take 2 tablets (650 mg total) by mouth daily. For headaches 100 tablet 3  . buPROPion (WELLBUTRIN XL) 300 MG 24 hr tablet Take 1 tablet by mouth once daily 90 tablet 5  . clonazePAM (KLONOPIN) 0.25 MG disintegrating tablet Take 1 tablet (0.25 mg total) by mouth 2 (two) times daily as needed for seizure. 60 tablet 1  . Multiple Vitamin (MULTIVITAMIN WITH MINERALS) TABS Take 1 tablet by mouth daily.    . valACYclovir (VALTREX) 500 MG tablet Take 1 tablet by mouth once daily 90 tablet 3  . Liraglutide -Weight Management (SAXENDA) 18 MG/3ML SOPN Inject 0.5 mLs (3 mg total) into the skin daily. (Patient not taking: Reported on 05/26/2020) 3 mL 5  . phentermine (ADIPEX-P) 37.5 MG tablet Take 1 tablet (37.5 mg total) by mouth daily before breakfast. 30 tablet 2   No facility-administered medications prior to visit.    ROS: Review of Systems  Constitutional: Positive for unexpected weight change. Negative for activity change, appetite change, chills and fatigue.  HENT: Negative for congestion, mouth sores and sinus pressure.   Eyes: Negative for visual disturbance.  Respiratory: Negative for cough and chest tightness.   Gastrointestinal: Negative for abdominal pain and nausea.  Genitourinary: Negative for difficulty urinating, frequency and vaginal pain.  Musculoskeletal: Negative for back pain and gait problem.  Skin: Negative for pallor and rash.  Neurological: Negative for dizziness, tremors, weakness, numbness and headaches.   Psychiatric/Behavioral: Negative for confusion and sleep disturbance. The patient is nervous/anxious.     Objective:  BP 120/72 (BP Location: Left Arm)   Pulse 61   Temp 98.8 F (37.1 C) (Oral)   Wt 187 lb 9.6 oz (85.1 kg)   SpO2 99%   BMI 33.77 kg/m   BP Readings from Last 3 Encounters:  05/26/20 120/72  12/16/19 138/76  06/30/18 116/78    Wt Readings from Last 3 Encounters:  05/26/20 187 lb 9.6 oz (85.1 kg)  12/16/19 179 lb (81.2 kg)  06/30/18 158 lb (71.7 kg)    Physical Exam Constitutional:      General: She is not in acute distress.    Appearance: She is well-developed. She is obese.  HENT:     Head: Normocephalic.     Right Ear: External ear normal.     Left Ear: External ear normal.     Nose: Nose normal.     Mouth/Throat:     Mouth: Oropharynx is clear and moist.  Eyes:     General:        Right eye: No discharge.        Left eye: No discharge.     Conjunctiva/sclera: Conjunctivae normal.     Pupils: Pupils are equal, round, and reactive to light.  Neck:     Thyroid: No thyromegaly.     Vascular: No JVD.     Trachea: No tracheal deviation.  Cardiovascular:     Rate and Rhythm: Normal rate and regular rhythm.     Heart sounds: Normal heart sounds.  Pulmonary:     Effort: No respiratory distress.     Breath sounds: No stridor. No wheezing.  Abdominal:     General: Bowel sounds are normal. There is no distension.     Palpations: Abdomen is soft. There is no mass.     Tenderness: There is no abdominal tenderness. There is no guarding or rebound.  Musculoskeletal:        General: No tenderness or edema.     Cervical back: Normal range of motion and neck supple.  Lymphadenopathy:     Cervical: No cervical adenopathy.  Skin:    Findings: No erythema or rash.  Neurological:     Cranial Nerves: No cranial nerve deficit.     Motor: No abnormal muscle tone.     Coordination: Coordination normal.     Deep Tendon Reflexes: Reflexes normal.   Psychiatric:        Mood and Affect: Mood and affect normal.        Behavior: Behavior normal.        Thought Content: Thought content normal.        Judgment: Judgment normal.      A total time of >25 minutes was spent preparing to see the patient, reviewing tests,  records.  Also, obtaining history and performing comprehensive physical exam.  Additionally, counseling the patient regarding wt issues.   Filled out a health form.  Lab Results  Component Value Date   WBC 6.7 12/16/2019   HGB 12.7 12/16/2019   HCT 37.6 12/16/2019   PLT 273 12/16/2019   GLUCOSE 78 12/16/2019   CHOL 151 12/16/2019   TRIG 46 12/16/2019   HDL 72 12/16/2019   LDLCALC 66 12/16/2019   ALT 20 12/16/2019   AST 19 12/16/2019   NA 137 12/16/2019   K 4.1 12/16/2019   CL 103 12/16/2019   CREATININE 0.82 12/16/2019   BUN 11 12/16/2019   CO2 26 12/16/2019   TSH 1.27 12/16/2019   INR 2.1 07/03/2012    CT Chest W Contrast  Result Date: 10/08/2012 *RADIOLOGY REPORT* Clinical Data: Follow-up axillary and subpectoral lymphadenopathy. CT CHEST WITH CONTRAST Technique:  Multidetector CT imaging of the chest was performed following the standard protocol during bolus administration of intravenous contrast. Contrast: 29mL OMNIPAQUE IOHEXOL 300 MG/ML  SOLN Comparison: 12/28/2011 Findings: Multiple small bilateral axillary and subpectoral lymph nodes are again seen measuring up to 10 mm, and are stable compared to prior exam.  No mediastinal or hilar lymphadenopathy identified. No adenopathy seen elsewhere within the thorax. No evidence of pleural or pericardial effusion.  Both lungs are clear.  No suspicious pulmonary nodules or masses are identified. No evidence of infiltrate or central endobronchial obstruction.  No evidence of chest wall mass or suspicious bone lesions. IMPRESSION: Stable shotty bilateral axillary and subpectoral lymph nodes measuring up to 10 mm.  No new or progressive lymphadenopathy or other  significant abnormality. Original Report Authenticated By: Myles Rosenthal, M.D.    Assessment & Plan:    Sonda Primes, MD

## 2020-05-28 ENCOUNTER — Encounter: Payer: Self-pay | Admitting: Internal Medicine

## 2020-11-19 ENCOUNTER — Other Ambulatory Visit: Payer: Self-pay | Admitting: Internal Medicine

## 2020-11-23 ENCOUNTER — Ambulatory Visit: Payer: 59 | Admitting: Internal Medicine

## 2020-11-30 ENCOUNTER — Ambulatory Visit: Payer: 59 | Admitting: Internal Medicine

## 2020-12-05 ENCOUNTER — Other Ambulatory Visit: Payer: Self-pay

## 2020-12-05 ENCOUNTER — Ambulatory Visit (INDEPENDENT_AMBULATORY_CARE_PROVIDER_SITE_OTHER): Payer: 59 | Admitting: Internal Medicine

## 2020-12-05 ENCOUNTER — Encounter: Payer: Self-pay | Admitting: Internal Medicine

## 2020-12-05 VITALS — BP 130/72 | HR 66 | Temp 98.5°F | Ht 62.25 in | Wt 192.8 lb

## 2020-12-05 DIAGNOSIS — F329 Major depressive disorder, single episode, unspecified: Secondary | ICD-10-CM | POA: Diagnosis not present

## 2020-12-05 DIAGNOSIS — E6609 Other obesity due to excess calories: Secondary | ICD-10-CM | POA: Diagnosis not present

## 2020-12-05 DIAGNOSIS — Z0000 Encounter for general adult medical examination without abnormal findings: Secondary | ICD-10-CM

## 2020-12-05 DIAGNOSIS — Z6835 Body mass index (BMI) 35.0-35.9, adult: Secondary | ICD-10-CM | POA: Diagnosis not present

## 2020-12-05 NOTE — Patient Instructions (Signed)
The Obesity Code book by Jason Fung   These suggestions will probably help you to improve your metabolism if you are not overweight and to lose weight if you are overweight: 1.  Reduce your consumption of sugars and starches.  Eliminate high fructose corn syrup from your diet.  Reduce your consumption of processed foods.  For desserts try to have seasonal fruits, berries, nuts, cheeses or dark chocolate with more than 70% cacao. 2.  Do not snack 3.  You do not have to eat breakfast.  If you choose to have breakfast - eat plain greek yogurt, eggs, oatmeal (without sugar) - use honey if you need to. 4.  Drink water, freshly brewed unsweetened tea (green, black or herbal) or coffee.  Do not drink sodas including diet sodas , juices, beverages sweetened with artificial sweeteners. 5.  Reduce your consumption of refined grains. 6.  Avoid protein drinks such as Optifast, Slim fast etc. Eat chicken, fish, meat, dairy and beans for your sources of protein. 7.  Natural unprocessed fats like cold pressed virgin olive oil, butter, coconut oil are good for you.  Eat avocados. 8.  Increase your consumption of fiber.  Fruits, berries, vegetables, whole grains, flaxseed, chia seeds, beans, popcorn, nuts, oatmeal are good sources of fiber 9.  Use vinegar in your diet, i.e. apple cider vinegar, red wine or balsamic vinegar 10.  You can try fasting.  For example you can skip breakfast and lunch every other day (24-hour fast) 11.  Stress reduction, good night sleep, relaxation, meditation, yoga and other physical activity is likely to help you to maintain low weight too. 12.  If you drink alcohol, limit your alcohol intake to no more than 2 drinks a day.   Cabbage soup recipe that will not make you gain weight: Take 1 small head of cabbage, 1 average pack of celery, 4 green peppers, 4 onions, 2 cans diced tomatoes (they are not available without salt), salt and spices to taste.  Chop cabbage, celery, peppers and  onions.  And tomatoes and 2-2.5 liters (2.5 quarts) of water so that it would just cover the vegetables.  Bring to boil.  Add spices and salt.  Turn heat to low/medium and simmer for 20-25 minutes.  Naturally, you can make a smaller batch and change some of the ingredients.  

## 2020-12-05 NOTE — Assessment & Plan Note (Signed)
Discussed Cont Wellbutrin XL Sleep more

## 2020-12-05 NOTE — Assessment & Plan Note (Signed)
Cont Wellbutrin XL

## 2020-12-05 NOTE — Progress Notes (Signed)
Subjective:  Patient ID: Tonya Watkins, female    DOB: 21-Oct-1970  Age: 50 y.o. MRN: [id number]  CC: Follow-up (6 month f/u)   HPI Tonya Watkins presents for stress, depression, wt gain, Vit D def  Outpatient Medications Prior to Visit  Medication Sig Dispense Refill   aspirin 325 MG tablet Take 2 tablets (650 mg total) by mouth daily. For headaches 100 tablet 3   buPROPion (WELLBUTRIN XL) 300 MG 24 hr tablet Take 1 tablet by mouth once daily 30 tablet 5   clonazePAM (KLONOPIN) 0.25 MG disintegrating tablet Take 1 tablet (0.25 mg total) by mouth 2 (two) times daily as needed. 60 tablet 1   Multiple Vitamin (MULTIVITAMIN WITH MINERALS) TABS Take 1 tablet by mouth daily.     phentermine (ADIPEX-P) 37.5 MG tablet TAKE 1 TABLET BY MOUTH ONCE DAILY BEFORE BREAKFAST 30 tablet 0   valACYclovir (VALTREX) 500 MG tablet Take 1 tablet by mouth once daily 90 tablet 3   No facility-administered medications prior to visit.    ROS: Review of Systems  Constitutional:  Positive for unexpected weight change. Negative for activity change, appetite change, chills and fatigue.  HENT:  Negative for congestion, mouth sores and sinus pressure.   Eyes:  Negative for visual disturbance.  Respiratory:  Negative for cough and chest tightness.   Gastrointestinal:  Negative for abdominal pain and nausea.  Genitourinary:  Negative for difficulty urinating, frequency and vaginal pain.  Musculoskeletal:  Negative for back pain and gait problem.  Skin:  Negative for pallor and rash.  Neurological:  Negative for dizziness, tremors, weakness, numbness and headaches.  Psychiatric/Behavioral:  Negative for confusion and sleep disturbance.    Objective:  BP 130/72 (BP Location: Left Arm)   Pulse 66   Temp 98.5 F (36.9 C) (Oral)   Ht 5' 2.25" (1.581 m)   Wt 192 lb 12.8 oz (87.5 kg)   SpO2 97%   BMI 34.98 kg/m   BP Readings from Last 3 Encounters:  12/05/20 130/72  05/26/20 120/72  12/16/19 138/76    Wt  Readings from Last 3 Encounters:  12/05/20 192 lb 12.8 oz (87.5 kg)  05/26/20 187 lb 9.6 oz (85.1 kg)  12/16/19 179 lb (81.2 kg)    Physical Exam Constitutional:      General: She is not in acute distress.    Appearance: She is well-developed. She is obese.  HENT:     Head: Normocephalic.     Right Ear: External ear normal.     Left Ear: External ear normal.     Nose: Nose normal.  Eyes:     General:        Right eye: No discharge.        Left eye: No discharge.     Conjunctiva/sclera: Conjunctivae normal.     Pupils: Pupils are equal, round, and reactive to light.  Neck:     Thyroid: No thyromegaly.     Vascular: No JVD.     Trachea: No tracheal deviation.  Cardiovascular:     Rate and Rhythm: Normal rate and regular rhythm.     Heart sounds: Normal heart sounds.  Pulmonary:     Effort: No respiratory distress.     Breath sounds: No stridor. No wheezing.  Abdominal:     General: Bowel sounds are normal. There is no distension.     Palpations: Abdomen is soft. There is no mass.     Tenderness: There is no abdominal tenderness. There is no  guarding or rebound.  Musculoskeletal:        General: No tenderness.     Cervical back: Normal range of motion and neck supple. No rigidity.  Lymphadenopathy:     Cervical: No cervical adenopathy.  Skin:    Findings: No erythema or rash.  Neurological:     Cranial Nerves: No cranial nerve deficit.     Motor: No abnormal muscle tone.     Coordination: Coordination normal.     Deep Tendon Reflexes: Reflexes normal.  Psychiatric:        Behavior: Behavior normal.        Thought Content: Thought content normal.        Judgment: Judgment normal.    Lab Results  Component Value Date   WBC 6.7 12/16/2019   HGB 12.7 12/16/2019   HCT 37.6 12/16/2019   PLT 273 12/16/2019   GLUCOSE 78 12/16/2019   CHOL 151 12/16/2019   TRIG 46 12/16/2019   HDL 72 12/16/2019   LDLCALC 66 12/16/2019   ALT 20 12/16/2019   AST 19 12/16/2019   NA  137 12/16/2019   K 4.1 12/16/2019   CL 103 12/16/2019   CREATININE 0.82 12/16/2019   BUN 11 12/16/2019   CO2 26 12/16/2019   TSH 1.27 12/16/2019   INR 2.1 07/03/2012    CT Chest W Contrast  Result Date: 10/08/2012 *RADIOLOGY REPORT* Clinical Data: Follow-up axillary and subpectoral lymphadenopathy. CT CHEST WITH CONTRAST Technique:  Multidetector CT imaging of the chest was performed following the standard protocol during bolus administration of intravenous contrast. Contrast: 75mL OMNIPAQUE IOHEXOL 300 MG/ML  SOLN Comparison: 12/28/2011 Findings: Multiple small bilateral axillary and subpectoral lymph nodes are again seen measuring up to 10 mm, and are stable compared to prior exam.  No mediastinal or hilar lymphadenopathy identified. No adenopathy seen elsewhere within the thorax. No evidence of pleural or pericardial effusion.  Both lungs are clear.  No suspicious pulmonary nodules or masses are identified. No evidence of infiltrate or central endobronchial obstruction.  No evidence of chest wall mass or suspicious bone lesions. IMPRESSION: Stable shotty bilateral axillary and subpectoral lymph nodes measuring up to 10 mm.  No new or progressive lymphadenopathy or other significant abnormality. Original Report Authenticated By: Myles Rosenthal, M.D.    Assessment & Plan:    Tonya Primes, MD

## 2020-12-21 ENCOUNTER — Other Ambulatory Visit: Payer: Self-pay | Admitting: Internal Medicine

## 2021-03-07 ENCOUNTER — Encounter: Payer: 59 | Admitting: Internal Medicine

## 2021-03-18 ENCOUNTER — Other Ambulatory Visit: Payer: Self-pay | Admitting: Internal Medicine

## 2021-04-19 ENCOUNTER — Other Ambulatory Visit: Payer: Self-pay | Admitting: Internal Medicine

## 2021-05-24 ENCOUNTER — Other Ambulatory Visit (INDEPENDENT_AMBULATORY_CARE_PROVIDER_SITE_OTHER): Payer: 59

## 2021-05-24 DIAGNOSIS — E6609 Other obesity due to excess calories: Secondary | ICD-10-CM | POA: Diagnosis not present

## 2021-05-24 DIAGNOSIS — Z0000 Encounter for general adult medical examination without abnormal findings: Secondary | ICD-10-CM | POA: Diagnosis not present

## 2021-05-24 DIAGNOSIS — F329 Major depressive disorder, single episode, unspecified: Secondary | ICD-10-CM

## 2021-05-24 DIAGNOSIS — Z6835 Body mass index (BMI) 35.0-35.9, adult: Secondary | ICD-10-CM | POA: Diagnosis not present

## 2021-05-24 LAB — URINALYSIS
Bilirubin Urine: NEGATIVE
Hgb urine dipstick: NEGATIVE
Ketones, ur: NEGATIVE
Leukocytes,Ua: NEGATIVE
Nitrite: NEGATIVE
Specific Gravity, Urine: <=1.005 — AB (ref 1.000–1.030)
Total Protein, Urine: NEGATIVE
Urine Glucose: NEGATIVE
Urobilinogen, UA: 0.2 (ref 0.0–1.0)
pH: 6 (ref 5.0–8.0)

## 2021-05-24 LAB — CBC WITH DIFFERENTIAL/PLATELET
Basophils Absolute: 0 10*3/uL (ref 0.0–0.1)
Basophils Relative: 0.2 % (ref 0.0–3.0)
Eosinophils Absolute: 0 10*3/uL (ref 0.0–0.7)
Eosinophils Relative: 0.3 % (ref 0.0–5.0)
HCT: 38.4 % (ref 36.0–46.0)
Hemoglobin: 12.5 g/dL (ref 12.0–15.0)
Lymphocytes Relative: 13.6 % (ref 12.0–46.0)
Lymphs Abs: 1.2 10*3/uL (ref 0.7–4.0)
MCHC: 32.5 g/dL (ref 30.0–36.0)
MCV: 94.6 fl (ref 78.0–100.0)
Monocytes Absolute: 0.7 10*3/uL (ref 0.1–1.0)
Monocytes Relative: 7.6 % (ref 3.0–12.0)
Neutro Abs: 7 10*3/uL (ref 1.4–7.7)
Neutrophils Relative %: 78.3 % — ABNORMAL HIGH (ref 43.0–77.0)
Platelets: 349 10*3/uL (ref 150.0–400.0)
RBC: 4.06 Mil/uL (ref 3.87–5.11)
RDW: 13.1 % (ref 11.5–15.5)
WBC: 9 10*3/uL (ref 4.0–10.5)

## 2021-05-24 LAB — COMPREHENSIVE METABOLIC PANEL
ALT: 18 U/L (ref 0–35)
AST: 19 U/L (ref 0–37)
Albumin: 4.7 g/dL (ref 3.5–5.2)
Alkaline Phosphatase: 50 U/L (ref 39–117)
BUN: 5 mg/dL — ABNORMAL LOW (ref 6–23)
CO2: 25 mEq/L (ref 19–32)
Calcium: 9.7 mg/dL (ref 8.4–10.5)
Chloride: 101 mEq/L (ref 96–112)
Creatinine, Ser: 0.74 mg/dL (ref 0.40–1.20)
GFR: 94.92 mL/min (ref >60.00)
Glucose, Bld: 92 mg/dL (ref 70–99)
Potassium: 3.6 mEq/L (ref 3.5–5.1)
Sodium: 135 mEq/L (ref 135–145)
Total Bilirubin: 0.5 mg/dL (ref 0.2–1.2)
Total Protein: 8.2 g/dL (ref 6.0–8.3)

## 2021-05-24 LAB — LIPID PANEL
Cholesterol: 152 mg/dL (ref 0–200)
HDL: 68.9 mg/dL (ref >39.00)
LDL Cholesterol: 76 mg/dL (ref 0–99)
NonHDL: 82.79
Total CHOL/HDL Ratio: 2
Triglycerides: 34 mg/dL (ref 0.0–149.0)
VLDL: 6.8 mg/dL (ref 0.0–40.0)

## 2021-05-24 LAB — TSH: TSH: 1.88 u[IU]/mL (ref 0.35–5.50)

## 2021-06-01 ENCOUNTER — Emergency Department: Payer: 59

## 2021-06-01 ENCOUNTER — Encounter: Payer: Self-pay | Admitting: Emergency Medicine

## 2021-06-01 ENCOUNTER — Emergency Department
Admission: EM | Admit: 2021-06-01 | Discharge: 2021-06-02 | Disposition: A | Discharge status: Other Acute Inpatient Hospital | Payer: 59 | Source: Home / Self Care | Attending: Emergency Medicine | Admitting: Emergency Medicine

## 2021-06-01 ENCOUNTER — Other Ambulatory Visit: Payer: Self-pay

## 2021-06-01 DIAGNOSIS — F32A Depression, unspecified: Secondary | ICD-10-CM | POA: Diagnosis present

## 2021-06-01 DIAGNOSIS — Z20822 Contact with and (suspected) exposure to covid-19: Secondary | ICD-10-CM | POA: Insufficient documentation

## 2021-06-01 DIAGNOSIS — F419 Anxiety disorder, unspecified: Secondary | ICD-10-CM

## 2021-06-01 DIAGNOSIS — F23 Brief psychotic disorder: Secondary | ICD-10-CM | POA: Diagnosis not present

## 2021-06-01 DIAGNOSIS — F410 Panic disorder [episodic paroxysmal anxiety] without agoraphobia: Secondary | ICD-10-CM | POA: Diagnosis present

## 2021-06-01 DIAGNOSIS — R079 Chest pain, unspecified: Secondary | ICD-10-CM

## 2021-06-01 DIAGNOSIS — F341 Dysthymic disorder: Secondary | ICD-10-CM | POA: Diagnosis present

## 2021-06-01 LAB — URINALYSIS, ROUTINE W REFLEX MICROSCOPIC
Bilirubin Urine: NEGATIVE
Glucose, UA: NEGATIVE mg/dL
Ketones, ur: 5 mg/dL — AB
Nitrite: NEGATIVE
Protein, ur: NEGATIVE mg/dL
Specific Gravity, Urine: 1.018 (ref 1.005–1.030)
pH: 6 (ref 5.0–8.0)

## 2021-06-01 LAB — CBC
HCT: 34.9 % — ABNORMAL LOW (ref 36.0–46.0)
Hemoglobin: 11.8 g/dL — ABNORMAL LOW (ref 12.0–15.0)
MCH: 32 pg (ref 26.0–34.0)
MCHC: 33.8 g/dL (ref 30.0–36.0)
MCV: 94.6 fL (ref 80.0–100.0)
Platelets: 336 10*3/uL (ref 150–400)
RBC: 3.69 MIL/uL — ABNORMAL LOW (ref 3.87–5.11)
RDW: 12.7 % (ref 11.5–15.5)
WBC: 11.1 10*3/uL — ABNORMAL HIGH (ref 4.0–10.5)
nRBC: 0 % (ref 0.0–0.2)

## 2021-06-01 LAB — BASIC METABOLIC PANEL
Anion gap: 8 (ref 5–15)
BUN: 10 mg/dL (ref 6–20)
CO2: 26 mmol/L (ref 22–32)
Calcium: 9.6 mg/dL (ref 8.9–10.3)
Chloride: 105 mmol/L (ref 98–111)
Creatinine, Ser: 0.58 mg/dL (ref 0.44–1.00)
GFR, Estimated: >60 mL/min (ref >60)
Glucose, Bld: 129 mg/dL — ABNORMAL HIGH (ref 70–99)
Potassium: 3.5 mmol/L (ref 3.5–5.1)
Sodium: 139 mmol/L (ref 135–145)

## 2021-06-01 LAB — URINE DRUG SCREEN, QUALITATIVE (ARMC ONLY)
Amphetamines, Ur Screen: NOT DETECTED
Barbiturates, Ur Screen: NOT DETECTED
Benzodiazepine, Ur Scrn: NOT DETECTED
Cannabinoid 50 Ng, Ur ~~LOC~~: POSITIVE — AB
Cocaine Metabolite,Ur ~~LOC~~: NOT DETECTED
MDMA (Ecstasy)Ur Screen: NOT DETECTED
Methadone Scn, Ur: NOT DETECTED
Opiate, Ur Screen: NOT DETECTED
Phencyclidine (PCP) Ur S: NOT DETECTED
Tricyclic, Ur Screen: NOT DETECTED

## 2021-06-01 LAB — TROPONIN I (HIGH SENSITIVITY): Troponin I (High Sensitivity): 4 ng/L (ref <18)

## 2021-06-01 LAB — PREGNANCY, URINE: Preg Test, Ur: NEGATIVE

## 2021-06-01 MED ORDER — TRAZODONE HCL 50 MG PO TABS
50.0000 mg | ORAL_TABLET | Freq: Every day | ORAL | Status: DC
  Administered 2021-06-01: 50 mg via ORAL
  Filled 2021-06-01: qty 1

## 2021-06-01 NOTE — ED Provider Triage Note (Signed)
Emergency Medicine Provider Triage Evaluation Note  Tonya Watkins, a 51 y.o. female  was evaluated in triage.  Pt complains of flank pain and chest pain. Patient presents with her mother at bedside. She is lethargic and slow to answer questions.   Review of Systems  Positive: Flank pain, CP, malaise Negative: NV  Physical Exam  There were no vitals taken for this visit. Gen:   Awake, no distress  Lethargic Resp:  Normal effort  MSK:   Moves extremities without difficulty  Other:  CVS: RRR  Medical Decision Making  Medically screening exam initiated at 7:51 PM.  Appropriate orders placed.  Kip Bors was informed that the remainder of the evaluation will be completed by another provider, this initial triage assessment does not replace that evaluation, and the importance of remaining in the ED until their evaluation is complete.  Patient with ED evaluation of chest pain, flank pain, and malaise.    Lissa Hoard, PA-C 06/01/21 1954

## 2021-06-01 NOTE — ED Provider Notes (Addendum)
[facility] Provider Note    Event Date/Time   First MD Initiated Contact with Patient 06/01/21 2133     (approximate)   History   Chest Pain and Flank Pain   HPI  Tonya Watkins is a 51 y.o. female with history of depression on bupropion who presents initially for flank chest pain, but now stating that she mainly has stress, difficulty sleeping, and needs to rest.  The mother states that over the last few days, the patient has been acting not herself.  The patient's children have told the mother that the patient has not been sleeping well, which the patient agrees with.  The mother states that the patient has had increased stressors recently.  She talked to the patient yesterday and the patient seemed a little "off" but today she became more anxious, speaking and behaving somewhat erratically.  The mother states that the patient then said she did not feel well and needed to go to the hospital, but did not clearly say why.  The patient states that she did have some chest and flank pain, but states that these have resolved.  She states that her main concern is her stress and difficulty sleeping.  The patient states that she is on bupropion.  She denies any SI or HI.    Physical Exam   Triage Vital Signs: ED Triage Vitals  Enc Vitals Group     BP 06/01/21 1956 (!) 144/84     Pulse Rate 06/01/21 1956 72     Resp 06/01/21 1956 15     Temp 06/01/21 1956 99 F (37.2 C)     Temp Source 06/01/21 1956 Oral     SpO2 06/01/21 1956 99 %     Weight 06/01/21 1957 150 lb (68 kg)     Height 06/01/21 1957 5\' 3"  (1.6 m)     Head Circumference --      Peak Flow --      Pain Score 06/01/21 1956 2     Pain Loc --      Pain Edu? --      Excl. in [place]? --     Most recent vital signs: Vitals:   06/01/21 1956 06/01/21 2230  BP: (!) 144/84 (!) 151/79  Pulse: 72 84  Resp: 15 17  Temp: 99 F (37.2 C)   SpO2: 99% 100%    General: Awake, no distress.  CV:  Good  peripheral perfusion.  Resp:  Normal effort.  Abd:  No distention.  Other:  Motor intact in all extremities.  Anxious appearing.   ED Results / Procedures / Treatments   Labs (all labs ordered are listed, but only abnormal results are displayed) Labs Reviewed  BASIC METABOLIC PANEL - Abnormal; Notable for the following components:      Result Value   Glucose, Bld 129 (*)    All other components within normal limits  URINALYSIS, ROUTINE W REFLEX MICROSCOPIC - Abnormal; Notable for the following components:   Color, Urine YELLOW (*)    APPearance HAZY (*)    Hgb urine dipstick MODERATE (*)    Ketones, ur 5 (*)    Leukocytes,Ua TRACE (*)    Bacteria, UA MANY (*)    All other components within normal limits  URINE DRUG SCREEN, QUALITATIVE ([facility] ONLY) - Abnormal; Notable for the following components:   Cannabinoid 50 Ng, Ur [place] POSITIVE (*)    All other components within normal limits  CBC - Abnormal; Notable for  the following components:   WBC 11.1 (*)    RBC 3.69 (*)    Hemoglobin 11.8 (*)    HCT 34.9 (*)    All other components within normal limits  RESP PANEL BY RT-PCR (FLU A&B, COVID) ARPGX2  PREGNANCY, URINE  TROPONIN I (HIGH SENSITIVITY)     EKG  ED ECG REPORT I, Arta Silence, the attending physician, personally viewed and interpreted this ECG.  Date: 06/01/2021 EKG Time: 1945 Rate: 83 Rhythm: normal sinus rhythm QRS Axis: normal Intervals: normal ST/T Wave abnormalities: normal Narrative Interpretation: no evidence of acute ischemia; no significant change when compared to EKG of 12/28/2011    RADIOLOGY  Chest x-ray interpreted by me shows no focal consolidation or edema  PROCEDURES:  Critical Care performed: No  Procedures   MEDICATIONS ORDERED IN ED: Medications  traZODone (DESYREL) tablet 50 mg (has no administration in time range)     IMPRESSION / MDM / ASSESSMENT AND PLAN / ED COURSE  I reviewed the triage vital signs and the nursing  notes.  51 year old female with a PMH of depression presents initially telling triage that she had flank and chest pain as well as fatigue and difficulty sleeping.  She states that the pain has now resolved, and her main concern is increased stress, inability to sleep, and fatigue.  Per the triage RN, after waiting in the waiting room for a while, the patient got up and started shouting.  She is now calm in the room.  On exam the patient is overall well-appearing.  Her vital signs are normal.  The physical exam is unremarkable.  Neurologic exam is nonfocal.  However, the patient does appear quite anxious, and several times during my exam and interview behaved somewhat oddly, for example, covering her ears while I talked to her mother.  I reviewed the lab work-up obtained from triage and it is unremarkable.  There is no significant leukocytosis.  Troponin is negative.  Chemistry is normal.  UA shows bacteria but no nitrites, WBCs, or other findings to suggest UTI.  UDS is positive for cannabinoids.  In terms of the flank and chest pain, these are resolved and nonspecific.  The patient is somewhat difficult to obtain history from and is not particularly forthcoming with answering questions.  Several times during my discussion with her she turned to her mother and asked her mother to answer the questions for me.  At this time based on the resolved symptoms and negative work-up, there is no evidence of ACS, pulmonary embolism, other vascular emergency, ureteral stone, or other emergent cause of the patient's pain.  There is no indication for further ED work-up.  In terms of the patient's mental health, although behaving somewhat oddly, she is coherent and answering questions, does not appear to be responding to internal stimuli, and does not demonstrate evidence of acute psychosis.  She denies SI or HI.  There is no evidence that she is in acute danger to self or others.  However, I do think she would  benefit from evaluation by psychiatry, and the patient and mother agree at this time.  I have consulted psychiatry.  ----------------------------------------- 11:22 PM on 06/01/2021 -----------------------------------------  I discussed the case with the psychiatry NP [place].  Her recommendation is to admit the patient.  She is medically cleared at this time.   FINAL CLINICAL IMPRESSION(S) / ED DIAGNOSES   Final diagnoses:  Anxiety  Nonspecific chest pain     Rx / DC Orders   ED  Discharge Orders     None        Note:  This document was prepared using Dragon voice recognition software and may include unintentional dictation errors.    Arta Silence, MD 06/01/21 2322    Arta Silence, MD 06/01/21 2322

## 2021-06-01 NOTE — ED Notes (Signed)
This RN went into room to assess pt.  Pt states she is tired and just wants to sleep.  Will attempt assessment at later time.

## 2021-06-01 NOTE — ED Triage Notes (Signed)
Pt in via POV, reports ongoing and worsening right flank pain, today presenting with chest pain.  Patient reports extreme fatigue, mother states she has not been sleeping. Patient is difficult to engage with at times in triage.  Patient A/Ox4, vitals WDL at this time.

## 2021-06-01 NOTE — ED Provider Notes (Addendum)
----------------------------------------- °  11:01 PM on 06/01/2021 -----------------------------------------  Assuming care from Dr. Marisa Severin.  In short, Tonya Watkins is a 51 y.o. female with a chief complaint of psych issues.  Refer to the original H&P for additional details.  The current plan of care is to be evaluated by psych.   ----------------------------------------- 11:20 PM on 06/01/2021 -----------------------------------------  We consulted psychiatry and she was evaluated in person. Discussed case in person with psych, Rashaun.  She recommends inpatient psych admission.  Also recommends trazodone for sleep, ordering 50 mg PO.   The patient has been placed in psychiatric observation due to the need to provide a safe environment for the patient while obtaining psychiatric consultation and evaluation, as well as ongoing medical and medication management to treat the patient's condition.  The patient has not been placed under full IVC at this time.    Loleta Rose, MD 06/01/21 2321

## 2021-06-02 ENCOUNTER — Inpatient Hospital Stay
Admission: AD | Admit: 2021-06-02 | Discharge: 2021-06-05 | DRG: 885 | Disposition: A | Discharge status: Home / Self Care | Payer: 59 | Source: Intra-hospital | Attending: Psychiatry | Admitting: Psychiatry

## 2021-06-02 ENCOUNTER — Encounter: Payer: Self-pay | Admitting: Psychiatric/Mental Health

## 2021-06-02 ENCOUNTER — Other Ambulatory Visit: Payer: Self-pay

## 2021-06-02 DIAGNOSIS — Z20822 Contact with and (suspected) exposure to covid-19: Secondary | ICD-10-CM | POA: Diagnosis present

## 2021-06-02 DIAGNOSIS — F29 Unspecified psychosis not due to a substance or known physiological condition: Secondary | ICD-10-CM | POA: Diagnosis present

## 2021-06-02 DIAGNOSIS — G4700 Insomnia, unspecified: Secondary | ICD-10-CM | POA: Diagnosis present

## 2021-06-02 DIAGNOSIS — F23 Brief psychotic disorder: Secondary | ICD-10-CM | POA: Diagnosis present

## 2021-06-02 HISTORY — DX: Other specified health status: Z78.9

## 2021-06-02 LAB — RESP PANEL BY RT-PCR (FLU A&B, COVID) ARPGX2
Influenza A by PCR: NEGATIVE
Influenza B by PCR: NEGATIVE
SARS Coronavirus 2 by RT PCR: NEGATIVE

## 2021-06-02 MED ORDER — DIPHENHYDRAMINE HCL 50 MG/ML IJ SOLN
50.0000 mg | Freq: Once | INTRAMUSCULAR | Status: AC
  Administered 2021-06-02: 50 mg via INTRAMUSCULAR
  Filled 2021-06-02: qty 1

## 2021-06-02 MED ORDER — MAGNESIUM HYDROXIDE 400 MG/5ML PO SUSP: 30.0000 mL | Freq: Every day | ORAL | Status: DC | PRN

## 2021-06-02 MED ORDER — OLANZAPINE 5 MG PO TABS: 5.0000 mg | ORAL_TABLET | ORAL | Status: DC | PRN

## 2021-06-02 MED ORDER — LORAZEPAM 2 MG PO TABS: 2.0000 mg | ORAL_TABLET | Freq: Once | ORAL | Status: DC

## 2021-06-02 MED ORDER — LORAZEPAM 2 MG/ML IJ SOLN
INTRAMUSCULAR | Status: AC
  Filled 2021-06-02: qty 1

## 2021-06-02 MED ORDER — ACETAMINOPHEN 325 MG PO TABS: 650.0000 mg | ORAL_TABLET | Freq: Four times a day (QID) | ORAL | Status: DC | PRN

## 2021-06-02 MED ORDER — ALUM & MAG HYDROXIDE-SIMETH 200-200-20 MG/5ML PO SUSP: 30.0000 mL | ORAL | Status: DC | PRN

## 2021-06-02 MED ORDER — LORAZEPAM 1 MG PO TABS: 1.0000 mg | ORAL_TABLET | ORAL | Status: DC | PRN

## 2021-06-02 MED ORDER — DIPHENHYDRAMINE HCL 25 MG PO CAPS
50.0000 mg | ORAL_CAPSULE | Freq: Once | ORAL | Status: AC
  Filled 2021-06-02: qty 2

## 2021-06-02 MED ORDER — LORAZEPAM 2 MG PO TABS
2.0000 mg | ORAL_TABLET | Freq: Once | ORAL | Status: DC
  Filled 2021-06-02: qty 1

## 2021-06-02 MED ORDER — HYDROXYZINE HCL 25 MG PO TABS
25.0000 mg | ORAL_TABLET | Freq: Three times a day (TID) | ORAL | Status: DC | PRN
  Administered 2021-06-02: 25 mg via ORAL
  Filled 2021-06-02: qty 1

## 2021-06-02 MED ORDER — MIRTAZAPINE 15 MG PO TABS
15.0000 mg | ORAL_TABLET | Freq: Every day | ORAL | Status: DC
  Administered 2021-06-03: 15 mg via ORAL
  Filled 2021-06-02 (×2): qty 1

## 2021-06-02 MED ORDER — HALOPERIDOL LACTATE 5 MG/ML IJ SOLN
5.0000 mg | Freq: Once | INTRAMUSCULAR | Status: AC
  Administered 2021-06-02: 5 mg via INTRAMUSCULAR
  Filled 2021-06-02: qty 1

## 2021-06-02 MED ORDER — TRAZODONE HCL 100 MG PO TABS: 100.0000 mg | ORAL_TABLET | Freq: Every evening | ORAL | Status: DC | PRN

## 2021-06-02 MED ORDER — TRAZODONE HCL 50 MG PO TABS: 50.0000 mg | ORAL_TABLET | Freq: Every evening | ORAL | Status: DC | PRN

## 2021-06-02 MED ORDER — RISPERIDONE 1 MG PO TABS
0.5000 mg | ORAL_TABLET | ORAL | Status: DC
  Administered 2021-06-03 (×2): 0.5 mg via ORAL
  Filled 2021-06-02 (×2): qty 1

## 2021-06-02 MED ORDER — HALOPERIDOL 5 MG PO TABS
5.0000 mg | ORAL_TABLET | Freq: Once | ORAL | Status: AC
  Filled 2021-06-02: qty 1

## 2021-06-02 NOTE — ED Notes (Signed)
Mother at bedside given recliner.

## 2021-06-02 NOTE — Progress Notes (Signed)
Patient has been in bed sleeping, eyes closed, respirations WNL. No sign of distress.

## 2021-06-02 NOTE — Progress Notes (Signed)
Admission Note:  51 yr female who presents Voluntary commitment in no acute distress for the treatment of Depression. Upon arrival patient appears flat and depressed, she was calm and cooperative with admission process, she denies SI/HI/AVH  and contracts for safety upon admission. Patient stated she experienced worsening depression, anhedonia and sadness, in addition she and her long time boyfriend just split. Patient has Past medical Hx of Depression, her skin was assessed in presence of [facility] and found to be warm and dry, clear of any abnormal marks, In addition she was searched and no contraband found, POC and unit policies explained, understanding verbalized, and consents obtained. Food and fluids offered, and she accepted both. 15 minutes safety checks maintained will continue to monitor  closely.

## 2021-06-02 NOTE — H&P (Signed)
Psychiatric Admission Assessment Adult  Patient Identification: Tonya Watkins MRN:  [id number] Date of Evaluation:  06/02/2021 Chief Complaint:  Acute psychosis ([place]) [F23] Principal Diagnosis: Acute psychosis ([place]) Diagnosis:  Principal Problem:   Acute psychosis ([place])  History of Present Illness: Tonya Watkins is a 51 y.o. female with history of depression on bupropion who presents initially for flank chest pain, but now stating that she mainly has stress, difficulty sleeping, and needs to rest.   The mother states that over the last few days, the patient has been acting not herself.  The patient's children have told the mother that the patient has not been sleeping well, which the patient agrees with.  The mother states that the patient has had increased stressors recently.  She talked to the patient yesterday and the patient seemed a little "off" but today she became more anxious, speaking and behaving somewhat erratically.  The mother states that the patient then said she did not feel well and needed to go to the hospital, but did not clearly say why.   The patient states that she did have some chest and flank pain, but states that these have resolved.  She states that her main concern is her stress and difficulty sleeping.  The patient states that she is on bupropion.  She denies any SI or HI. Associated Signs/Symptoms: Depression Symptoms:  depressed mood, Duration of Depression Symptoms: Greater than two weeks  (Hypo) Manic Symptoms:  Distractibility, Anxiety Symptoms:  Excessive Worry, Psychotic Symptoms:  Hallucinations: Auditory PTSD Symptoms: Negative Total Time spent with patient: 1 hour  Past Psychiatric History: Depression treated by PCP  Is the patient at risk to self? Yes.    Has the patient been a risk to self in the past 6 months? Yes.    Has the patient been a risk to self within the distant past? No.  Is the patient a risk to others? No.  Has the patient been a  risk to others in the past 6 months? No.  Has the patient been a risk to others within the distant past? No.   Prior Inpatient Therapy:   Prior Outpatient Therapy:    Alcohol Screening: 1. How often do you have a drink containing alcohol?: Never 2. How many drinks containing alcohol do you have on a typical day when you are drinking?: 1 or 2 3. How often do you have six or more drinks on one occasion?: Never AUDIT-C Score: 0 4. How often during the last year have you found that you were not able to stop drinking once you had started?: Never 5. How often during the last year have you failed to do what was normally expected from you because of drinking?: Never 6. How often during the last year have you needed a first drink in the morning to get yourself going after a heavy drinking session?: Never 7. How often during the last year have you had a feeling of guilt of remorse after drinking?: Never 8. How often during the last year have you been unable to remember what happened the night before because you had been drinking?: Never 9. Have you or someone else been injured as a result of your drinking?: No 10. Has a relative or friend or a doctor or another health worker been concerned about your drinking or suggested you cut down?: No Alcohol Use Disorder Identification Test Final Score (AUDIT): 0 Substance Abuse History in the last 12 months:  No. Consequences of Substance Abuse: Negative Previous  Psychotropic Medications: Yes  Psychological Evaluations: No  Past Medical History:  Past Medical History:  Diagnosis Date   DVT (deep venous thrombosis) (HCC)    left calf   Heart palpitations    Medical history non-contributory    History reviewed. No pertinent surgical history. Family History:  Family History  Problem Relation Age of Onset   Allergies Mother    Cancer Mother        thyroid   Family Psychiatric  History: Unknown Tobacco Screening:   Social History:  Social History    Substance and Sexual Activity  Alcohol Use No     Social History   Substance and Sexual Activity  Drug Use No    Additional Social History:                           Allergies:  No Known Allergies Lab Results:  Results for orders placed or performed during the hospital encounter of 06/01/21 (from the past 48 hour(s))  Resp Panel by RT-PCR (Flu A&B, Covid) Nasopharyngeal Swab     Status: None   Collection Time: 06/01/21  7:54 PM   Specimen: Nasopharyngeal Swab; Nasopharyngeal(NP) swabs in vial transport medium  Result Value Ref Range   SARS Coronavirus 2 by RT PCR NEGATIVE NEGATIVE    Comment: (NOTE) SARS-CoV-2 target nucleic acids are NOT DETECTED.  The SARS-CoV-2 RNA is generally detectable in upper respiratory specimens during the acute phase of infection. The lowest concentration of SARS-CoV-2 viral copies this assay can detect is 138 copies/mL. A negative result does not preclude SARS-Cov-2 infection and should not be used as the sole basis for treatment or other patient management decisions. A negative result may occur with  improper specimen collection/handling, submission of specimen other than nasopharyngeal swab, presence of viral mutation(s) within the areas targeted by this assay, and inadequate number of viral copies(<138 copies/mL). A negative result must be combined with clinical observations, patient history, and epidemiological information. The expected result is Negative.  Fact Sheet for Patients:  EntrepreneurPulse.com.au  Fact Sheet for Healthcare Providers:  IncredibleEmployment.be  This test is no t yet approved or cleared by the [place] FDA and  has been authorized for detection and/or diagnosis of SARS-CoV-2 by FDA under an Emergency Use Authorization (EUA). This EUA will remain  in effect (meaning this test can be used) for the duration of the COVID-19 declaration under Section 564(b)(1) of  the Act, 21 U.S.C.section 360bbb-3(b)(1), unless the authorization is terminated  or revoked sooner.       Influenza A by PCR NEGATIVE NEGATIVE   Influenza B by PCR NEGATIVE NEGATIVE    Comment: (NOTE) The Xpert Xpress SARS-CoV-2/FLU/RSV plus assay is intended as an aid in the diagnosis of influenza from Nasopharyngeal swab specimens and should not be used as a sole basis for treatment. Nasal washings and aspirates are unacceptable for Xpert Xpress SARS-CoV-2/FLU/RSV testing.  Fact Sheet for Patients: EntrepreneurPulse.com.au  Fact Sheet for Healthcare Providers: IncredibleEmployment.be  This test is not yet approved or cleared by the [place] FDA and has been authorized for detection and/or diagnosis of SARS-CoV-2 by FDA under an Emergency Use Authorization (EUA). This EUA will remain in effect (meaning this test can be used) for the duration of the COVID-19 declaration under Section 564(b)(1) of the Act, 21 U.S.C. section 360bbb-3(b)(1), unless the authorization is terminated or revoked.  Performed at [facility], [address] [place], [place] [address]   Basic  metabolic panel     Status: Abnormal   Collection Time: 06/01/21  8:07 PM  Result Value Ref Range   Sodium 139 135 - 145 mmol/L   Potassium 3.5 3.5 - 5.1 mmol/L   Chloride 105 98 - 111 mmol/L   CO2 26 22 - 32 mmol/L   Glucose, Bld 129 (H) 70 - 99 mg/dL    Comment: Glucose reference range applies only to samples taken after fasting for at least 8 hours.   BUN 10 6 - 20 mg/dL   Creatinine, Ser 0.58 0.44 - 1.00 mg/dL   Calcium 9.6 8.9 - 10.3 mg/dL   GFR, Estimated >60 >60 mL/min    Comment: (NOTE) Calculated using the CKD-EPI Creatinine Equation (2021)    Anion gap 8 5 - 15    Comment: Performed at [facility], [address] [place], [place] [address]  Troponin I (High Sensitivity)     Status: None   Collection Time: 06/01/21  8:07 PM  Result  Value Ref Range   Troponin I (High Sensitivity) 4 <18 ng/L    Comment: (NOTE) Elevated high sensitivity troponin I (hsTnI) values and significant  changes across serial measurements may suggest ACS but many other  chronic and acute conditions are known to elevate hsTnI results.  Refer to the "Links" section for chest pain algorithms and additional  guidance. Performed at [facility], [address] [place], [place] [address]   Urinalysis, Routine w reflex microscopic Urine, Clean Catch     Status: Abnormal   Collection Time: 06/01/21  8:07 PM  Result Value Ref Range   Color, Urine YELLOW (A) YELLOW   APPearance HAZY (A) CLEAR   Specific Gravity, Urine 1.018 1.005 - 1.030   pH 6.0 5.0 - 8.0   Glucose, UA NEGATIVE NEGATIVE mg/dL   Hgb urine dipstick MODERATE (A) NEGATIVE   Bilirubin Urine NEGATIVE NEGATIVE   Ketones, ur 5 (A) NEGATIVE mg/dL   Protein, ur NEGATIVE NEGATIVE mg/dL   Nitrite NEGATIVE NEGATIVE   Leukocytes,Ua TRACE (A) NEGATIVE   RBC / HPF 0-5 0 - 5 RBC/hpf   WBC, UA 0-5 0 - 5 WBC/hpf   Bacteria, UA MANY (A) NONE SEEN   Squamous Epithelial / LPF 0-5 0 - 5   Mucus PRESENT     Comment: Performed at [facility], [address] [place], [place] [address]  Urine Drug Screen, Qualitative     Status: Abnormal   Collection Time: 06/01/21  8:07 PM  Result Value Ref Range   Tricyclic, Ur Screen NONE DETECTED NONE DETECTED   Amphetamines, Ur Screen NONE DETECTED NONE DETECTED   MDMA (Ecstasy)Ur Screen NONE DETECTED NONE DETECTED   Cocaine Metabolite,Ur [place] NONE DETECTED NONE DETECTED   Opiate, Ur Screen NONE DETECTED NONE DETECTED   Phencyclidine (PCP) Ur S NONE DETECTED NONE DETECTED   Cannabinoid 50 Ng, Ur  POSITIVE (A) NONE DETECTED   Barbiturates, Ur Screen NONE DETECTED NONE DETECTED   Benzodiazepine, Ur Scrn NONE DETECTED NONE DETECTED   Methadone Scn, Ur NONE DETECTED NONE DETECTED    Comment: (NOTE) Tricyclics + metabolites, urine    Cutoff  1000 ng/mL Amphetamines + metabolites, urine  Cutoff 1000 ng/mL MDMA (Ecstasy), urine              Cutoff 500 ng/mL Cocaine Metabolite, urine          Cutoff 300 ng/mL Opiate + metabolites, urine        Cutoff 300 ng/mL Phencyclidine (  PCP), urine         Cutoff 25 ng/mL Cannabinoid, urine                 Cutoff 50 ng/mL Barbiturates + metabolites, urine  Cutoff 200 ng/mL Benzodiazepine, urine              Cutoff 200 ng/mL Methadone, urine                   Cutoff 300 ng/mL  The urine drug screen provides only a preliminary, unconfirmed analytical test result and should not be used for non-medical purposes. Clinical consideration and professional judgment should be applied to any positive drug screen result due to possible interfering substances. A more specific alternate chemical method must be used in order to obtain a confirmed analytical result. Gas chromatography / mass spectrometry (GC/MS) is the preferred confirm atory method. Performed at [facility], [address] [place], [place] [address]   Pregnancy, urine     Status: None   Collection Time: 06/01/21  8:07 PM  Result Value Ref Range   Preg Test, Ur NEGATIVE NEGATIVE    Comment: Performed at [facility], [address] [place], [place] [address]  CBC     Status: Abnormal   Collection Time: 06/01/21  8:09 PM  Result Value Ref Range   WBC 11.1 (H) 4.0 - 10.5 K/uL   RBC 3.69 (L) 3.87 - 5.11 MIL/uL   Hemoglobin 11.8 (L) 12.0 - 15.0 g/dL   HCT 34.9 (L) 36.0 - 46.0 %   MCV 94.6 80.0 - 100.0 fL   MCH 32.0 26.0 - 34.0 pg   MCHC 33.8 30.0 - 36.0 g/dL   RDW 12.7 11.5 - 15.5 %   Platelets 336 150 - 400 K/uL   nRBC 0.0 0.0 - 0.2 %    Comment: Performed at [facility], [address] [place], [place] [address]    Blood Alcohol level:  No results found for: [facility]  Metabolic Disorder Labs:  No results found for: HGBA1C, MPG No results found for: PROLACTIN Lab Results  Component Value  Date   CHOL 152 05/24/2021   TRIG 34.0 05/24/2021   HDL 68.90 05/24/2021   CHOLHDL 2 05/24/2021   VLDL 6.8 05/24/2021   LDLCALC 76 05/24/2021   LDLCALC 66 12/16/2019    Current Medications: Current Facility-Administered Medications  Medication Dose Route Frequency Provider Last Rate Last Admin   acetaminophen (TYLENOL) tablet 650 mg  650 mg Oral Q6H PRN Deloria Lair, NP       alum & mag hydroxide-simeth (MAALOX/MYLANTA) 200-200-20 MG/5ML suspension 30 mL  30 mL Oral Q4H PRN Deloria Lair, NP       hydrOXYzine (ATARAX) tablet 25 mg  25 mg Oral TID PRN Deloria Lair, NP   25 mg at 06/02/21 0800   LORazepam (ATIVAN) tablet 2 mg  2 mg Oral Once Parks Ranger, DO       magnesium hydroxide (MILK OF MAGNESIA) suspension 30 mL  30 mL Oral Daily PRN Deloria Lair, NP       traZODone (DESYREL) tablet 50 mg  50 mg Oral QHS PRN Deloria Lair, NP       PTA Medications: Medications Prior to Admission  Medication Sig Dispense Refill Last Dose   aspirin 325 MG tablet Take 2 tablets (650 mg total) by mouth daily. For headaches 100 tablet 3    buPROPion (WELLBUTRIN XL) 300 MG 24 hr  tablet Take 1 tablet by mouth once daily 30 tablet 5    clonazePAM (KLONOPIN) 0.25 MG disintegrating tablet Take 1 tablet (0.25 mg total) by mouth 2 (two) times daily as needed. 60 tablet 1    Multiple Vitamin (MULTIVITAMIN WITH MINERALS) TABS Take 1 tablet by mouth daily.      phentermine (ADIPEX-P) 37.5 MG tablet TAKE 1 TABLET BY MOUTH ONCE DAILY BEFORE BREAKFAST 30 tablet 2    valACYclovir (VALTREX) 500 MG tablet Take 1 tablet by mouth once daily 30 tablet 5     Musculoskeletal: Strength & Muscle Tone: within normal limits Gait & Station: normal Patient leans: N/A            Psychiatric Specialty Exam:  Presentation  General Appearance: Bizarre  Eye Contact:Absent  Speech:Blocked  Speech Volume:Decreased  Handedness:No data recorded  Mood and Affect   Mood:Dysphoric  Affect:Constricted; Restricted   Thought Process  Thought Processes:Disorganized  Duration of Psychotic Symptoms: No data recorded Past Diagnosis of Schizophrenia or Psychoactive disorder: No  Descriptions of Associations:No data recorded Orientation:Other (comment) (noncommunicative)  Thought Content:Scattered  Hallucinations:Hallucinations: Other (comment) (unable to assess)  Ideas of Reference:-- (unable to assess)  Suicidal Thoughts:Suicidal Thoughts: No  Homicidal Thoughts:Homicidal Thoughts: No   Sensorium  Memory:-- (unable to assess)  Judgment:Impaired  Insight:None   Executive Functions  Concentration:Poor  Attention Span:Poor  Recall:Poor  Fund of Knowledge:Poor  Language:Poor   Psychomotor Activity  Psychomotor Activity:Psychomotor Activity: Restlessness   Assets  Assets:Desire for Improvement; Financial Resources/Insurance; Housing; Social Support   Sleep  Sleep:Sleep: Poor Number of Hours of Sleep: 0    Physical Exam: Physical Exam Vitals and nursing note reviewed.  Constitutional:      Appearance: Normal appearance. She is normal weight.  Neurological:     General: No focal deficit present.     Mental Status: She is alert and oriented to person, place, and time.  Psychiatric:        Attention and Perception: Attention normal. She perceives auditory hallucinations.        Mood and Affect: Mood is depressed.        Speech: Speech is tangential.        Behavior: Behavior is agitated.        Thought Content: Thought content is paranoid.        Cognition and Memory: Cognition and memory normal.        Judgment: Judgment is impulsive.   Review of Systems  Constitutional: Negative.   HENT: Negative.    Eyes: Negative.   Respiratory: Negative.    Cardiovascular: Negative.   Gastrointestinal: Negative.   Genitourinary: Negative.   Musculoskeletal: Negative.   Skin: Negative.   Neurological: Negative.    Endo/Heme/Allergies: Negative.   Psychiatric/Behavioral:  Positive for depression and hallucinations. The patient has insomnia.   Blood pressure (!) 149/87, pulse 75, temperature 98.4 F (36.9 C), temperature source Oral, resp. rate 17, height 5\' 3"  (1.6 m), weight 68.5 kg, SpO2 100 %. Body mass index is 26.75 kg/m.  Treatment Plan Summary: Daily contact with patient to assess and evaluate symptoms and progress in treatment, Medication management, and Plan See orders  Observation Level/Precautions:  15 minute checks  Laboratory:  CBC HbAIC UDS UA  Psychotherapy:    Medications:    Consultations:    Discharge Concerns:    Estimated LOS:  Other:     Physician Treatment Plan for Primary Diagnosis: Acute psychosis ([place]) Long Term Goal(s): Improvement in symptoms so as ready for  discharge  Short Term Goals: Ability to identify changes in lifestyle to reduce recurrence of condition will improve, Ability to verbalize feelings will improve, Ability to disclose and discuss suicidal ideas, Ability to demonstrate self-control will improve, Ability to identify and develop effective coping behaviors will improve, Ability to maintain clinical measurements within normal limits will improve, Compliance with prescribed medications will improve, and Ability to identify triggers associated with substance abuse/mental health issues will improve  Physician Treatment Plan for Secondary Diagnosis: Principal Problem:   Acute psychosis ([place])  I certify that inpatient services furnished can reasonably be expected to improve the patient's condition.    Parks Ranger, DO 1/21/202311:19 AM

## 2021-06-02 NOTE — Progress Notes (Signed)
Patient was asleep at the beginning of this shift. Woke up with disturbed mood and thought process, screaming, confused and stating that "I don't know what's happening to me.Marland KitchenMarland KitchenI want my mama, I miss my mama .". Patient urinated on self and needed total care. Patient is at high safety risk secondary to her increased psychosis.

## 2021-06-02 NOTE — Group Note (Signed)
LCSW Group Therapy Note  Group Date: 06/02/2021 Start Time: 1315 End Time: 1415   Type of Therapy and Topic:  Group Therapy - Healthy vs Unhealthy Coping Skills  Participation Level:  Did Not Attend   Description of Group The focus of this group was to determine what unhealthy coping techniques typically are used by group members and what healthy coping techniques would be helpful in coping with various problems. Patients were guided in becoming aware of the differences between healthy and unhealthy coping techniques. Patients were asked to identify 2-3 healthy coping skills they would like to learn to use more effectively.  Therapeutic Goals Patients learned that coping is what human beings do all day long to deal with various situations in their lives Patients defined and discussed healthy vs unhealthy coping techniques Patients identified their preferred coping techniques and identified whether these were healthy or unhealthy Patients determined 2-3 healthy coping skills they would like to become more familiar with and use more often. Patients provided support and ideas to each other   Summary of Patient Progress: Patient was observed sleeping during the time group occurred.   Therapeutic Modalities Cognitive Behavioral Therapy Motivational Interviewing  Berniece Salines, Latanya Presser 06/03/2021  3:09 PM

## 2021-06-02 NOTE — Tx Team (Cosign Needed)
Initial Treatment Plan 06/02/2021 3:41 AM Tonya Watkins [id number]    PATIENT STRESSORS: Loss of relationship    Medication change or noncompliance     PATIENT STRENGTHS: Ability for insight  General fund of knowledge  Motivation for treatment/growth    PATIENT IDENTIFIED PROBLEMS: Depression                      DISCHARGE CRITERIA:  Improved stabilization in mood, thinking, and/or behavior Medical problems require only outpatient monitoring  PRELIMINARY DISCHARGE PLAN: Outpatient therapy  PATIENT/FAMILY INVOLVEMENT: This treatment plan has been presented to and reviewed with the patient, Tonya Watkins, The patient and family have been given the opportunity to ask questions and make suggestions.  Trula Ore, RN 06/02/2021, 3:41 AM

## 2021-06-02 NOTE — Consult Note (Incomplete)
[facility] Face-to-Face Psychiatry Consult   Reason for Consult:  Psychiatric evaluation Referring Physician:  Dr. Karma Greaser Patient Identification: Nyleah Nickel MRN:  [id number] Principal Diagnosis: Panic attacks Diagnosis:  Principal Problem:   Panic attacks Active Problems:   DEPRESSION, SITUATIONAL, SEVERE   Total Time spent with patient: 45 minutes  Subjective:   Per triage nurse, Bonny Bramel is a 51 y.o. female patient admitted  via POV, reports ongoing and worsening right flank pain, today presenting with chest pain.  Patient reports extreme fatigue, mother states she has not been sleeping. Patient is difficult to engage with at times in triage.  Patient A/Ox4, vitals WDL at this time.  Marland Kitchen  HPI:  Alexius Chiaramonte, 51 y.o., female patient seen via tele health by TTS and this provider; chart reviewed and consulted with Dr. Dwyane Dee on 06/02/21.  On evaluation Clida Lindon reports  During evaluation Inika Coello is ***(position); ***he/she is alert/oriented x 4; calm/cooperative; and mood congruent with affect.  Patient is speaking in a clear tone at moderate volume, and normal pace; with good eye contact.  ***His/Her thought process is coherent and relevant; There is no indication that ***he/she is currently responding to internal/external stimuli or experiencing delusional thought content.  Patient denies suicidal/self-harm/homicidal ideation, psychosis, and paranoia.  Patient has remained calm throughout assessment and has answered questions appropriately.     For detailed note see TTS tele assessment note  Recommendations:  Disposition:  ***Patient is psychiatrically cleared  {CHL [facility] Consult IX:[id number]  Spoke with Dr. Marland Kitchen informed of above recommendation and disposition  Shuvon Rankin, NP   Past Psychiatric History: ***  Risk to Self:   Risk to Others:   Prior Inpatient Therapy:   Prior Outpatient Therapy:    Past Medical History:  Past Medical History:  Diagnosis Date    DVT (deep venous thrombosis) (HCC)    left calf   Heart palpitations    History reviewed. No pertinent surgical history. Family History:  Family History  Problem Relation Age of Onset   Allergies Mother    Cancer Mother        thyroid   Family Psychiatric  History: *** Social History:  Social History   Substance and Sexual Activity  Alcohol Use No     Social History   Substance and Sexual Activity  Drug Use No    Social History   Socioeconomic History   Marital status: Divorced    Spouse name: Not on file   Number of children: 3   Years of education: Not on file   Highest education level: Not on file  Occupational History   Occupation: RN  Tobacco Use   Smoking status: Never   Smokeless tobacco: Never  Vaping Use   Vaping Use: Unknown  Substance and Sexual Activity   Alcohol use: No   Drug use: No   Sexual activity: Yes  Other Topics Concern   Not on file  Social History Narrative   Not on file   Social Determinants of Health   Financial Resource Strain: Not on file  Food Insecurity: Not on file  Transportation Needs: Not on file  Physical Activity: Not on file  Stress: Not on file  Social Connections: Not on file   Additional Social History:    Allergies:  No Known Allergies  Labs:  Results for orders placed or performed during the hospital encounter of 06/01/21 (from the past 48 hour(s))  Resp Panel by RT-PCR (Flu A&B, Covid) Nasopharyngeal Swab  Status: None   Collection Time: 06/01/21  7:54 PM   Specimen: Nasopharyngeal Swab; Nasopharyngeal(NP) swabs in vial transport medium  Result Value Ref Range   SARS Coronavirus 2 by RT PCR NEGATIVE NEGATIVE    Comment: (NOTE) SARS-CoV-2 target nucleic acids are NOT DETECTED.  The SARS-CoV-2 RNA is generally detectable in upper respiratory specimens during the acute phase of infection. The lowest concentration of SARS-CoV-2 viral copies this assay can detect is 138 copies/mL. A  negative result does not preclude SARS-Cov-2 infection and should not be used as the sole basis for treatment or other patient management decisions. A negative result may occur with  improper specimen collection/handling, submission of specimen other than nasopharyngeal swab, presence of viral mutation(s) within the areas targeted by this assay, and inadequate number of viral copies(<138 copies/mL). A negative result must be combined with clinical observations, patient history, and epidemiological information. The expected result is Negative.  Fact Sheet for Patients:  EntrepreneurPulse.com.au  Fact Sheet for Healthcare Providers:  IncredibleEmployment.be  This test is no t yet approved or cleared by the [place] FDA and  has been authorized for detection and/or diagnosis of SARS-CoV-2 by FDA under an Emergency Use Authorization (EUA). This EUA will remain  in effect (meaning this test can be used) for the duration of the COVID-19 declaration under Section 564(b)(1) of the Act, 21 U.S.C.section 360bbb-3(b)(1), unless the authorization is terminated  or revoked sooner.       Influenza A by PCR NEGATIVE NEGATIVE   Influenza B by PCR NEGATIVE NEGATIVE    Comment: (NOTE) The Xpert Xpress SARS-CoV-2/FLU/RSV plus assay is intended as an aid in the diagnosis of influenza from Nasopharyngeal swab specimens and should not be used as a sole basis for treatment. Nasal washings and aspirates are unacceptable for Xpert Xpress SARS-CoV-2/FLU/RSV testing.  Fact Sheet for Patients: EntrepreneurPulse.com.au  Fact Sheet for Healthcare Providers: IncredibleEmployment.be  This test is not yet approved or cleared by the [place] FDA and has been authorized for detection and/or diagnosis of SARS-CoV-2 by FDA under an Emergency Use Authorization (EUA). This EUA will remain in effect (meaning this test can be used)  for the duration of the COVID-19 declaration under Section 564(b)(1) of the Act, 21 U.S.C. section 360bbb-3(b)(1), unless the authorization is terminated or revoked.  Performed at [facility], [address] [place], [place] [id number]   Basic metabolic panel     Status: Abnormal   Collection Time: 06/01/21  8:07 PM  Result Value Ref Range   Sodium 139 135 - 145 mmol/L   Potassium 3.5 3.5 - 5.1 mmol/L   Chloride 105 98 - 111 mmol/L   CO2 26 22 - 32 mmol/L   Glucose, Bld 129 (H) 70 - 99 mg/dL    Comment: Glucose reference range applies only to samples taken after fasting for at least 8 hours.   BUN 10 6 - 20 mg/dL   Creatinine, Ser 0.58 0.44 - 1.00 mg/dL   Calcium 9.6 8.9 - 10.3 mg/dL   GFR, Estimated >60 >60 mL/min    Comment: (NOTE) Calculated using the CKD-EPI Creatinine Equation (2021)    Anion gap 8 5 - 15    Comment: Performed at [facility], [address] [place],  [address]  Troponin I (High Sensitivity)     Status: None   Collection Time: 06/01/21  8:07 PM  Result Value Ref Range   Troponin I (High Sensitivity) 4 <18 ng/L    Comment: (NOTE) Elevated  high sensitivity troponin I (hsTnI) values and significant  changes across serial measurements may suggest ACS but many other  chronic and acute conditions are known to elevate hsTnI results.  Refer to the "Links" section for chest pain algorithms and additional  guidance. Performed at [facility], [address] [place], [place] [address]   Urinalysis, Routine w reflex microscopic Urine, Clean Catch     Status: Abnormal   Collection Time: 06/01/21  8:07 PM  Result Value Ref Range   Color, Urine YELLOW (A) YELLOW   APPearance HAZY (A) CLEAR   Specific Gravity, Urine 1.018 1.005 - 1.030   pH 6.0 5.0 - 8.0   Glucose, UA NEGATIVE NEGATIVE mg/dL   Hgb urine dipstick MODERATE (A) NEGATIVE   Bilirubin Urine NEGATIVE NEGATIVE   Ketones, ur 5 (A) NEGATIVE mg/dL   Protein, ur  NEGATIVE NEGATIVE mg/dL   Nitrite NEGATIVE NEGATIVE   Leukocytes,Ua TRACE (A) NEGATIVE   RBC / HPF 0-5 0 - 5 RBC/hpf   WBC, UA 0-5 0 - 5 WBC/hpf   Bacteria, UA MANY (A) NONE SEEN   Squamous Epithelial / LPF 0-5 0 - 5   Mucus PRESENT     Comment: Performed at [facility], [address] [place], [place] [address]  Urine Drug Screen, Qualitative     Status: Abnormal   Collection Time: 06/01/21  8:07 PM  Result Value Ref Range   Tricyclic, Ur Screen NONE DETECTED NONE DETECTED   Amphetamines, Ur Screen NONE DETECTED NONE DETECTED   MDMA (Ecstasy)Ur Screen NONE DETECTED NONE DETECTED   Cocaine Metabolite,Ur [place] NONE DETECTED NONE DETECTED   Opiate, Ur Screen NONE DETECTED NONE DETECTED   Phencyclidine (PCP) Ur S NONE DETECTED NONE DETECTED   Cannabinoid 50 Ng, Ur [place] POSITIVE (A) NONE DETECTED   Barbiturates, Ur Screen NONE DETECTED NONE DETECTED   Benzodiazepine, Ur Scrn NONE DETECTED NONE DETECTED   Methadone Scn, Ur NONE DETECTED NONE DETECTED    Comment: (NOTE) Tricyclics + metabolites, urine    Cutoff 1000 ng/mL Amphetamines + metabolites, urine  Cutoff 1000 ng/mL MDMA (Ecstasy), urine              Cutoff 500 ng/mL Cocaine Metabolite, urine          Cutoff 300 ng/mL Opiate + metabolites, urine        Cutoff 300 ng/mL Phencyclidine (PCP), urine         Cutoff 25 ng/mL Cannabinoid, urine                 Cutoff 50 ng/mL Barbiturates + metabolites, urine  Cutoff 200 ng/mL Benzodiazepine, urine              Cutoff 200 ng/mL Methadone, urine                   Cutoff 300 ng/mL  The urine drug screen provides only a preliminary, unconfirmed analytical test result and should not be used for non-medical purposes. Clinical consideration and professional judgment should be applied to any positive drug screen result due to possible interfering substances. A more specific alternate chemical method must be used in order to obtain a confirmed analytical result. Gas chromatography /  mass spectrometry (GC/MS) is the preferred confirm atory method. Performed at [facility], [address] [place], [place] [address]   Pregnancy, urine     Status: None   Collection Time: 06/01/21  8:07 PM  Result Value Ref Range   Preg  Test, Ur NEGATIVE NEGATIVE    Comment: Performed at [facility], [address] [place], [place] [address]  CBC     Status: Abnormal   Collection Time: 06/01/21  8:09 PM  Result Value Ref Range   WBC 11.1 (H) 4.0 - 10.5 K/uL   RBC 3.69 (L) 3.87 - 5.11 MIL/uL   Hemoglobin 11.8 (L) 12.0 - 15.0 g/dL   HCT 34.9 (L) 36.0 - 46.0 %   MCV 94.6 80.0 - 100.0 fL   MCH 32.0 26.0 - 34.0 pg   MCHC 33.8 30.0 - 36.0 g/dL   RDW 12.7 11.5 - 15.5 %   Platelets 336 150 - 400 K/uL   nRBC 0.0 0.0 - 0.2 %    Comment: Performed at [facility], [address] [place], [place] [address]    No current facility-administered medications for this encounter.   No current outpatient medications on file.    Musculoskeletal: Strength & Muscle Tone: {desc; muscle tone:32375} Gait & Station: {PE GAIT ED [id number] Patient leans: {Patient Leans:[id number]}  Psychiatric Specialty Exam:  Presentation  General Appearance: Bizarre  Eye Contact:Absent  Speech:Blocked  Speech Volume:Decreased  Handedness:No data recorded  Mood and Affect  Mood:Dysphoric  Affect:Constricted; Restricted   Thought Process  Thought Processes:Disorganized  Descriptions of Associations:No data recorded Orientation:Other (comment) (noncommunicative)  Thought Content:Scattered  History of Schizophrenia/Schizoaffective disorder:No  Duration of Psychotic Symptoms:No data recorded Hallucinations:Hallucinations: Other (comment) (unable to assess)  Ideas of Reference:-- (unable to assess)  Suicidal Thoughts:Suicidal Thoughts: No  Homicidal Thoughts:Homicidal Thoughts: No   Sensorium  Memory:-- (unable to  assess)  Judgment:Impaired  Insight:None   Executive Functions  Concentration:Poor  Attention Span:Poor  Recall:Poor  Fund of Knowledge:Poor  Language:Poor   Psychomotor Activity  Psychomotor Activity:Psychomotor Activity: Restlessness   Assets  Assets:Desire for Improvement; Financial Resources/Insurance; Housing; Social Support   Sleep  Sleep:Sleep: Poor Number of Hours of Sleep: 0   Physical Exam: Physical Exam Vitals and nursing note reviewed.  Constitutional:      General: She is in acute distress.  HENT:     Head: Normocephalic and atraumatic.  Eyes:     Pupils: Pupils are equal, round, and reactive to light.  Cardiovascular:     Rate and Rhythm: Normal rate.  Pulmonary:     Effort: Pulmonary effort is normal.  Musculoskeletal:        General: Normal range of motion.     Cervical back: Normal range of motion.  Neurological:     Mental Status: She is disoriented.  Psychiatric:        Attention and Perception: She is inattentive.        Mood and Affect: Mood is depressed. Affect is flat and tearful.        Speech: She is noncommunicative.        Behavior: Behavior is withdrawn. Behavior is not aggressive.        Judgment: Judgment is inappropriate.   Review of Systems  Psychiatric/Behavioral:  Positive for depression. The patient has insomnia.   All other systems reviewed and are negative. Blood pressure 135/68, pulse 91, temperature 99 F (37.2 C), temperature source Oral, resp. rate 18, height 5\' 3"  (1.6 m), weight 68 kg, SpO2 100 %. Body mass index is 26.57 kg/m.  Treatment Plan Summary: {CHL [facility] [place] [id number]  Disposition: {CHL [facility] Consult [id number]  Deloria Lair, NP 06/02/2021 2:21 AM

## 2021-06-02 NOTE — BHH Suicide Risk Assessment (Signed)
[facility] Admission Suicide Risk Assessment   Nursing information obtained from:  Patient Demographic factors:  Adolescent or young adult Current Mental Status:  NA Loss Factors:  Loss of significant relationship, Decline in physical health Historical Factors:  Impulsivity Risk Reduction Factors:  Sense of responsibility to family, Living with another person, especially a relative, Religious beliefs about death, Positive social support  Total Time spent with patient: 1 hour Principal Problem: Acute psychosis ([place]) Diagnosis:  Principal Problem:   Acute psychosis ([place])  Subjective Data: Tonya Watkins is a 51 y.o. female with history of depression on bupropion who presents initially for flank chest pain, but now stating that she mainly has stress, difficulty sleeping, and needs to rest.   The mother states that over the last few days, the patient has been acting not herself.  The patient's children have told the mother that the patient has not been sleeping well, which the patient agrees with.  The mother states that the patient has had increased stressors recently.  She talked to the patient yesterday and the patient seemed a little "off" but today she became more anxious, speaking and behaving somewhat erratically.  The mother states that the patient then said she did not feel well and needed to go to the hospital, but did not clearly say why.   The patient states that she did have some chest and flank pain, but states that these have resolved.  She states that her main concern is her stress and difficulty sleeping.  The patient states that she is on bupropion.  She denies any SI or HI.  Continued Clinical Symptoms:  Alcohol Use Disorder Identification Test Final Score (AUDIT): 0 The "Alcohol Use Disorders Identification Test", Guidelines for Use in Primary Care, Second Edition.  World Pharmacologist [facility]). Score between 0-7:  no or low risk or alcohol related problems. Score between 8-15:   moderate risk of alcohol related problems. Score between 16-19:  high risk of alcohol related problems. Score 20 or above:  warrants further diagnostic evaluation for alcohol dependence and treatment.   CLINICAL FACTORS:   Depression:   Anhedonia   Musculoskeletal: Strength & Muscle Tone: within normal limits Gait & Station: normal Patient leans: N/A  Psychiatric Specialty Exam:  Presentation  General Appearance: Bizarre  Eye Contact:Absent  Speech:Blocked  Speech Volume:Decreased  Handedness:No data recorded  Mood and Affect  Mood:Dysphoric  Affect:Constricted; Restricted   Thought Process  Thought Processes:Disorganized  Descriptions of Associations:No data recorded Orientation:Other (comment) (noncommunicative)  Thought Content:Scattered  History of Schizophrenia/Schizoaffective disorder:No  Duration of Psychotic Symptoms:No data recorded Hallucinations:Hallucinations: Other (comment) (unable to assess)  Ideas of Reference:-- (unable to assess)  Suicidal Thoughts:Suicidal Thoughts: No  Homicidal Thoughts:Homicidal Thoughts: No   Sensorium  Memory:-- (unable to assess)  Judgment:Impaired  Insight:None   Executive Functions  Concentration:Poor  Attention Span:Poor  Recall:Poor  Fund of Knowledge:Poor  Language:Poor   Psychomotor Activity  Psychomotor Activity:Psychomotor Activity: Restlessness   Assets  Assets:Desire for Improvement; Financial Resources/Insurance; Housing; Social Support   Sleep  Sleep:Sleep: Poor Number of Hours of Sleep: 0    Physical Exam: Physical Exam Vitals and nursing note reviewed.  Constitutional:      Appearance: Normal appearance. She is normal weight.  Neurological:     General: No focal deficit present.     Mental Status: She is alert and oriented to person, place, and time.  Psychiatric:        Attention and Perception: She is inattentive. She perceives auditory hallucinations.  Mood  and Affect: Mood is anxious and depressed.        Speech: She is noncommunicative. Speech is tangential.        Behavior: Behavior is agitated.        Thought Content: Thought content is paranoid and delusional.        Cognition and Memory: Cognition and memory normal.        Judgment: Judgment is impulsive.   Review of Systems  Constitutional: Negative.   HENT: Negative.    Eyes: Negative.   Respiratory: Negative.    Cardiovascular: Negative.   Gastrointestinal: Negative.   Genitourinary: Negative.   Musculoskeletal: Negative.   Skin: Negative.   Neurological: Negative.   Endo/Heme/Allergies: Negative.   Psychiatric/Behavioral:  Positive for depression and hallucinations. The patient has insomnia.   Blood pressure (!) 149/87, pulse 75, temperature 98.4 F (36.9 C), temperature source Oral, resp. rate 17, height 5\' 3"  (1.6 m), weight 68.5 kg, SpO2 100 %. Body mass index is 26.75 kg/m.   COGNITIVE FEATURES THAT CONTRIBUTE TO RISK:  None    SUICIDE RISK:   Minimal: No identifiable suicidal ideation.  Patients presenting with no risk factors but with morbid ruminations; may be classified as minimal risk based on the severity of the depressive symptoms  PLAN OF CARE: See orders  I certify that inpatient services furnished can reasonably be expected to improve the patient's condition.   Parks Ranger, DO 06/02/2021, 11:24 AM

## 2021-06-02 NOTE — BH Assessment (Signed)
Patient is to be admitted to [facility] by Psychiatric Nurse Practitioner Dixon.  Attending Physician will be Dr.  Weber Cooks .   Patient has been assigned to room 319, by [place].   Intake Paper Work has been signed and placed on patient chart.  ER staff is aware of the admission: Melody ER Secretary   Dr. Karma Greaser, ER MD  Nira Conn Patient's Nurse  Helene Kelp Patient Access.

## 2021-06-02 NOTE — Plan of Care (Signed)
Newly admitted. Continues to experience increased anxiety, fear, restlessness and disturbed thought process. Remains on 1:1 for safety.

## 2021-06-02 NOTE — BH Assessment (Signed)
Patient currently under review with BMU

## 2021-06-02 NOTE — BHH Counselor (Signed)
This afternoon, patient is resting comfortably after experiencing an acute episode of distress earlier this morning, marked by disturbed mood and thought process per nursing report. CSW will attempt to complete PSA at a later time when patient is in a calmer mental state.  Albertine Patricia, MSW, [place], [place] 06/02/2021 4:04PM

## 2021-06-02 NOTE — Progress Notes (Signed)
Patient continues to experience increased anxiety, restlessness, fear, disorientation and confusions, and agitations. Staff unable to console. Ativan 2mg , Benadryl 50mg  and Haldol 5mg  given IM per MD order. Patient on 1:1 for safety precautions.

## 2021-06-02 NOTE — Progress Notes (Signed)
Patient has remainedasleep with no sign of distress. Safety precautions maintained.  

## 2021-06-02 NOTE — BH Assessment (Signed)
Comprehensive Clinical Assessment (CCA) Note  06/02/2021 Tonya Watkins [id number]  Chief Complaint: Patient is a 51 year old female presenting to [facility] ED voluntarily. Per triage note Pt in via POV, reports ongoing and worsening right flank pain, today presenting with chest pain.  Patient reports extreme fatigue, mother states she has not been sleeping. Patient is difficult to engage with at times in triage.  Patient A/Ox4, vitals WDL at this time. During assessment patient appears alert and oriented, mood appears depressed, patient is not really talking much and continues to say "I just need to sleep", she is not very forthcoming with information. Majority of information comes form her mother Tonya Watkins who reports that the patient has been stressed and that she has not been sleeping for weeks. Patient's mother reports that the patient has 3 adult children that live with her and that her fiance' just recently walked out and left her. Patient is tearful during some of the assessment and nods her head in agreement to some of the things discussed. Patient's mother also reports that one of the patient's daughters has depression and how difficult that has been on the patient with trying to give her daughter support. Patient is able to deny current SI/HI/AH/VH  Per Psyc NP [name] patient is recommended for Inpatient Chief Complaint  Patient presents with   Chest Pain   Flank Pain   Depression   Visit Diagnosis: Depression    CCA Screening, Triage and Referral (STR)  Patient Reported Information How did you hear about [place]? Self  Referral name: No data recorded Referral phone number: No data recorded  Whom do you see for routine medical problems? No data recorded Practice/Facility Name: No data recorded Practice/Facility Phone Number: No data recorded Name of Contact: No data recorded Contact Number: No data recorded Contact Fax Number: No data recorded Prescriber Name: No data  recorded Prescriber Address (if known): No data recorded  What Is the Reason for Your Visit/Call Today? Patient presents voluntarily with her mother due to physical pain and some depression  How Long Has This Been Causing You Problems? > than 6 months  What Do You Feel Would Help You the Most Today? No data recorded  Have You Recently Been in Any Inpatient Treatment (Hospital/Detox/Crisis Center/28-Day Program)? No data recorded Name/Location of Program/Hospital:No data recorded How Long Were You There? No data recorded When Were You Discharged? No data recorded  Have You Ever Received Services From [facility] Before? No data recorded Who Do You See at [facility]? No data recorded  Have You Recently Had Any Thoughts About Hurting Yourself? No  Are You Planning to Commit Suicide/Harm Yourself At This time? No   Have you Recently Had Thoughts About [place]? No  Explanation: No data recorded  Have You Used Any Alcohol or Drugs in the Past 24 Hours? No  How Long Ago Did You Use Drugs or Alcohol? No data recorded What Did You Use and How Much? No data recorded  Do You Currently Have a Therapist/Psychiatrist? No  Name of Therapist/Psychiatrist: No data recorded  Have You Been Recently Discharged From Any Office Practice or Programs? No  Explanation of Discharge From Practice/Program: No data recorded    CCA Screening Triage Referral Assessment Type of Contact: Face-to-Face  Is this Initial or Reassessment? No data recorded Date Telepsych consult ordered in [facility]:  No data recorded Time Telepsych consult ordered in [facility]:  No data recorded  Patient Reported Information Reviewed? No data recorded  Patient Left Without Being Seen? No data recorded Reason for Not Completing Assessment: No data recorded  Collateral Involvement: No data recorded  Does Patient Have a [place]? No data recorded Name and Contact of Legal Guardian: No data  recorded If Minor and Not Living with Parent(s), Who has Custody? No data recorded Is CPS involved or ever been involved? Never  Is APS involved or ever been involved? Never   Patient Determined To Be At Risk for Harm To Self or Others Based on Review of Patient Reported Information or Presenting Complaint? No  Method: No data recorded Availability of Means: No data recorded Intent: No data recorded Notification Required: No data recorded Additional Information for Danger to Others Potential: No data recorded Additional Comments for Danger to Others Potential: No data recorded Are There Guns or Other Weapons in Your Home? No data recorded Types of Guns/Weapons: No data recorded Are These Weapons Safely Secured?                            No data recorded Who Could Verify You Are Able To Have These Secured: No data recorded Do You Have any Outstanding Charges, Pending Court Dates, Parole/Probation? No data recorded Contacted To Inform of Risk of Harm To Self or Others: No data recorded  Location of Assessment: [facility] ED   Does Patient Present under Involuntary Commitment? No  IVC Papers Initial File Date: No data recorded  [place] of Residence: [place]   Patient Currently Receiving the Following Services: No data recorded  Determination of Need: Emergent (2 hours)   Options For Referral: No data recorded    CCA Biopsychosocial Intake/Chief Complaint:  No data recorded Current Symptoms/Problems: No data recorded  Patient Reported Schizophrenia/Schizoaffective Diagnosis in Past: No   Strengths: Patient is able to communicate  Preferences: No data recorded Abilities: No data recorded  Type of Services Patient Feels are Needed: No data recorded  Initial Clinical Notes/Concerns: No data recorded  Mental Health Symptoms Depression:   Change in energy/activity; Fatigue; Sleep (too much or little)   Duration of Depressive symptoms:  Greater than two weeks   Mania:    None   Anxiety:    None   Psychosis:   None   Duration of Psychotic symptoms: No data recorded  Trauma:   None   Obsessions:   None   Compulsions:   None   Inattention:   None   Hyperactivity/Impulsivity:   None   Oppositional/Defiant Behaviors:   None   Emotional Irregularity:   None   Other Mood/Personality Symptoms:  No data recorded   Mental Status Exam Appearance and self-care  Stature:   Average   Weight:   Average weight   Clothing:   Casual   Grooming:   Normal   Cosmetic use:   None   Posture/gait:   Normal   Motor activity:   Not Remarkable   Sensorium  Attention:   Normal   Concentration:   Normal   Orientation:   X5   Recall/memory:   Normal   Affect and Mood  Affect:   Flat   Mood:   Depressed   Relating  Eye contact:   Avoided   Facial expression:   Responsive   Attitude toward examiner:   Guarded   Thought and Language  Speech flow:  Soft   Thought content:   Appropriate to Mood and Circumstances   Preoccupation:   None  Hallucinations:   None   Organization:  No data recorded  [employer] of Knowledge:   Fair   Intelligence:   Average   Abstraction:   Normal   Judgement:   Fair   Building surveyor   Insight:   Fair   Decision Making:   Confused   Social Functioning  Social Maturity:   Responsible   Social Judgement:   Normal   Stress  Stressors:   Other (Comment)   Coping Ability:   Normal   Skill Deficits:   None   Supports:   Family     Religion: Religion/Spirituality Are You A Religious Person?: No  Leisure/Recreation: Leisure / Recreation Do You Have Hobbies?: No  Exercise/Diet: Exercise/Diet Do You Exercise?: No Have You Gained or Lost A Significant Amount of Weight in the Past Six Months?: No Do You Follow a Special Diet?: No Do You Have Any Trouble Sleeping?: Yes Explanation of Sleeping Difficulties: Patient's  mother reports that she has not slept in 3 weeks   CCA Employment/Education Employment/Work Situation: Employment / Work Situation Employment Situation: Employed Work Stressors: None reported Patient's Job has Been Impacted by Current Illness: No  Education: Education Is Patient Currently Attending School?: No Did Physicist, medical?: Yes What Type of College Degree Do you Have?: Bachelors in Nursing Did You Have Any Difficulty At [employer]?: No Patient's Education Has Been Impacted by Current Illness: No   CCA Family/Childhood History Family and Relationship History: Family history Marital status: Single Does patient have children?: Yes How many children?: 3 How is patient's relationship with their children?: Patient has a good relationship with her children  Childhood History:  Childhood History By whom was/is the patient raised?: Mother Did patient suffer any verbal/emotional/physical/sexual abuse as a child?: No Did patient suffer from severe childhood neglect?: No Has patient ever been sexually abused/assaulted/raped as an adolescent or adult?: No Was the patient ever a victim of a crime or a disaster?: No Witnessed domestic violence?: No Has patient been affected by domestic violence as an adult?: No  Child/Adolescent Assessment:     CCA Substance Use Alcohol/Drug Use: Alcohol / Drug Use Pain Medications: See MAR Prescriptions: See MAR Over the Counter: See MAR History of alcohol / drug use?: No history of alcohol / drug abuse                         ASAM's:  Six Dimensions of Multidimensional Assessment  Dimension 1:  Acute Intoxication and/or Withdrawal Potential:      Dimension 2:  Biomedical Conditions and Complications:      Dimension 3:  Emotional, Behavioral, or Cognitive Conditions and Complications:     Dimension 4:  Readiness to Change:     Dimension 5:  Relapse, Continued use, or Continued Problem Potential:     Dimension 6:   Recovery/Living Environment:     ASAM Severity Score:    ASAM Recommended Level of Treatment:     Substance use Disorder (SUD)    Recommendations for Services/Supports/Treatments:    DSM5 Diagnoses: Patient Active Problem List   Diagnosis Date Noted   Well adult exam 06/30/2018   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   Tinnitus of right ear 08/15/2017   Middle ear effusion, right 08/15/2017   Fatigue due to depression 04/10/2016   CTS (carpal tunnel syndrome) 03/10/2013   Depression, reactive 03/10/2013   OSA (obstructive sleep apnea) 11/24/2012   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  10/02/2012   Obesity 10/02/2012   Eczema 03/09/2012   Axillary adenopathy 01/01/2012   Palpitations 01/01/2012   Panic attacks 11/20/2008   PANIC DISORDER 09/21/2008   DEPRESSION, SITUATIONAL, SEVERE 09/21/2008   SHOULDER PAIN 11/16/2007   SINUSITIS 10/13/2007   ALLERGIC RHINITIS 10/13/2007   FATIGUE 10/13/2007   ANEMIA, HX OF 10/13/2007    Patient Centered Plan: Patient is on the following Treatment Plan(s):  Depression   Referrals to Alternative Service(s): Referred to Alternative Service(s):   Place:   Date:   Time:    Referred to Alternative Service(s):   Place:   Date:   Time:    Referred to Alternative Service(s):   Place:   Date:   Time:    Referred to Alternative Service(s):   Place:   Date:   Time:     Jenesis Suchy A Laurelin Elson, LCAS-A

## 2021-06-03 NOTE — Progress Notes (Signed)
B[facility]/BMU LCSW Progress Note   06/03/2021    11:33 AM  Tonya Watkins   [id number]   Type of Contact and Topic:  Release of Information  [name], patient, provided written consent for CSW team to coordinate aftercare at this time. CSW to have further conversations with patient regarding care in the community.   Patient is interested in outpatient therapy, does not believe med mgnt is neccessary at discharge. Patient lives in [place].   Signed:  Corky Crafts, MSW, [place], Bridget Hartshorn 06/03/2021 11:33 AM

## 2021-06-03 NOTE — Progress Notes (Signed)
D: Pt alert and oriented. Pt denies experiencing any anxiety/depression at this time. Pt denies experiencing any pain at this time. Pt denies experiencing any SI/HI, or AVH at this time.   Pt was apprehensive to take morning medication. Pt asked what it was for and received medication education. Pt appears paranoid at times however is calm and cooperative.   Pt was apologetic toward nursing staff for her behavior yesterday.   A: Scheduled medications administered to pt, per MD orders. Support and encouragement provided. Frequent verbal contact made. Routine safety checks conducted q15 minutes.   R: No adverse drug reactions noted. Pt verbally contracts for safety at this time. Pt complaint with medications. Pt interacts well with others on the unit. Pt remains safe at this time. Will continue to monitor.

## 2021-06-03 NOTE — BHH Counselor (Signed)
Adult Comprehensive Assessment  Patient ID: Tonya Watkins, female   DOB: 02-22-1971, 51 y.o.   MRN: [id number]  Information Source: Information source: Patient  Current Stressors:  Patient states their primary concerns and needs for treatment are:: Patient complains of inability to sleep due to stress related to family. Patient states their goals for this hospitilization and ongoing recovery are:: Patient states she would like to address her sleep and learn stress coping skills. Educational / Learning stressors: none reported Employment / Job issues: none reported Family Relationships: Patient states the primary reason for insomnia are "things are going on with the kids . . . helping them navigate life and becoming men" Patient has two adult sons, though does not provide details about the issues they are experiencing. Financial / Lack of resources (include bankruptcy): none reported Housing / Lack of housing: none reported Physical health (include injuries & life threatening diseases): none reported Social relationships: non reported Substance abuse: none reported Bereavement / Loss: Patient states, "nothing recently"  Living/Environment/Situation:  Living Arrangements: Children Living conditions (as described by patient or guardian): Patient states living conditions are WNL. 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 What is atmosphere in current home: Comfortable, Supportive  Family History:  Marital status: Divorced (Patient she is current exiting a 7 year relationship post divorce.) Divorced, when?: 2013 What types of issues is patient dealing with in the relationship?: Patient does not provide details about recently (Dec) ending 7 year relationship. Does patient have children?: Yes How many children?: 3 How is patient's relationship with their children?: Patient has a good relationship with her  children  Childhood History:  By whom was/is the patient raised?: Mother, Father Additional childhood history information: Parents divorced and shared custody. Description of patient's relationship with caregiver when they were a child: Patient states, "I had a good relationship with my parents" Patient's description of current relationship with people who raised him/her: Patient continues to have a good relationship with parents. Does patient have siblings?: Yes Number of Siblings: 2 Description of patient's current relationship with siblings: Patient states she has a good relationship with brother who lives nearby and maintains communication with sister who lives in [place]. Did patient suffer any verbal/emotional/physical/sexual abuse as a child?: No Did patient suffer from severe childhood neglect?: No Has patient ever been sexually abused/assaulted/raped as an adolescent or adult?: No Was the patient ever a victim of a crime or a disaster?: No Witnessed domestic violence?: No  Education:  Highest grade of school patient has completed: HS Diploma; Therapist, sports Currently a student?: No Learning disability?: No  Employment/Work Situation:   Employment Situation: Employed Where is Patient Currently Employed?: [place] [employer] [place] has Patient Been Employed?: unknown Are You Satisfied With Your Job?: Yes Do You Work More Than One Job?: No Work Stressors: None reported Patient's Job has Been Impacted by Current Illness: No Has Patient ever Been in the [employer]?: No  Financial Resources:   Museum/gallery curator resources: Income from employment, Private insurance Does patient have a representative payee or guardian?: No  Alcohol/Substance Abuse:   What has been your use of drugs/alcohol within the last 12 months?: Patient reports occasional wine, denies all drug use; UDS postiive for THC If attempted suicide, did drugs/alcohol play a role in this?: No Alcohol/Substance Abuse Treatment Hx:  Denies past history Has alcohol/substance abuse ever caused legal problems?: No  Social Support System:   Patient's  Community Support System: Manufacturing engineer System: Patient lists multiple people as supportive of her mental health and general wellbeing to include "mother, family, friends, and boss" Type of faith/religion: Cristian How does patient's faith help to cope with current illness?: Patient requests to see chaplin.  Leisure/Recreation:   Do You Have Hobbies?: Yes Leisure and Hobbies: Patient able to list multiple hobbies to include poetry, cooking, reading, biking, and walking her toy poodle.  Strengths/Needs:   Patient states these barriers may affect/interfere with their treatment: none reported Patient states these barriers may affect their return to the community: none reported Other important information patient would like considered in planning for their treatment: none reported  Discharge Plan:   Currently receiving community mental health services: No Patient states concerns and preferences for aftercare planning are: Patient is not interested in med mgnt, thoguh requests referral for therapy. Does patient have access to transportation?: Yes Does patient have financial barriers related to discharge medications?: No Will patient be returning to same living situation after discharge?: Yes  Summary/Recommendations:   Summary and Recommendations (to be completed by the evaluator): 51 y/o female w/ dx of Acute Psychosis (potentially Brief Psychotic Episode) from [place] w/ [employer] ins; admitted due to insomnia and disorganized thought/behaviors. Timeline of symptoms is not clear. Since admisison paitent has present as confused/disorganized w/ bladder incontinence. This morning, patient is much improved, no longer appears confused, though remains blunted. Patient minimizing during assessment, unclear extent of stressors; patient does not forward  details. Patient does not endores any trauma hx or prior psych hx. Patient presents as calm and cooperative, orriented to person/place/time/situation, appearance is WNL, no evidence of psychotic features during assessment. Patient requests to speak with therapist after discharge, does not believe pschiatric med mgnt is nessessary at discharge. Theraputic recomendations include crisis stabilization, med mgnt, group therapy, and case management.  Durenda Hurt. 06/03/2021

## 2021-06-03 NOTE — Group Note (Signed)
LCSW Group Therapy Note  Group Date: 06/03/2021 Start Time: 1300 End Time: 1345   Type of Therapy and Topic:  Group Therapy - How To Cope with Nervousness about Discharge   Participation Level:  None   Description of Group This process group involved identification of patients' feelings about discharge. Some of them are scheduled to be discharged soon, while others are new admissions, but each of them was asked to share thoughts and feelings surrounding discharge from the hospital. One common theme was that they are excited at the prospect of going home, while another was that many of them are apprehensive about sharing why they were hospitalized. Patients were given the opportunity to discuss these feelings with their peers in preparation for discharge.  Therapeutic Goals  Patient will identify their overall feelings about pending discharge. Patient will think about how they might proactively address issues that they believe will once again arise once they get home (i.e. with parents). Patients will participate in discussion about having hope for change.   Summary of Patient Progress:  Patient presented to the last few minutes of group. Patient was invited to participate in the closing remarks; however patient declined, stating she preferred to listen.   Therapeutic Modalities Cognitive Behavioral Therapy   Norberto Sorenson, Theresia Majors 06/03/2021  3:08 PM

## 2021-06-03 NOTE — Progress Notes (Signed)
Patient slept most of the night, she was so tired that she was not able to receive her night time medication. She did awake around 4am and she was very apologetic for her previous behaviors.

## 2021-06-03 NOTE — Progress Notes (Signed)
[facility] MD Progress Note  06/03/2021 1:19 PM Tonya Watkins  MRN:  [id number] Subjective: Patient states that she is doing better.  She needed as needed medication yesterday.  She apparently had not been sleeping very well for days.  She has been very stressed out.  She missed her Remeron dose last night because she was asleep.  She has been much more pleasant and cooperative with staff today and we are going to discontinue her one-to-one.  She did take the Risperdal this morning.  Patient slept most of the night, she was so tired that she was not able to receive her night time medication. She did awake around 4am and she was very apologetic for her previous behaviors.     Principal Problem: Acute psychosis ([place]) Diagnosis: Principal Problem:   Acute psychosis ([place])  Total Time spent with patient: 15 minutes  Past Psychiatric History: No  Past Medical History:  Past Medical History:  Diagnosis Date   DVT (deep venous thrombosis) (HCC)    left calf   Heart palpitations    Medical history non-contributory    History reviewed. No pertinent surgical history. Family History:  Family History  Problem Relation Age of Onset   Allergies Mother    Cancer Mother        thyroid    Social History:  Social History   Substance and Sexual Activity  Alcohol Use No     Social History   Substance and Sexual Activity  Drug Use No    Social History   Socioeconomic History   Marital status: Divorced    Spouse name: Not on file   Number of children: 3   Years of education: Not on file   Highest education level: Not on file  Occupational History   Occupation: Therapist, sports  Tobacco Use   Smoking status: Never   Smokeless tobacco: Never  Vaping Use   Vaping Use: Unknown  Substance and Sexual Activity   Alcohol use: No   Drug use: No   Sexual activity: Yes  Other Topics Concern   Not on file  Social History Narrative   Not on file   Social Determinants of Health   Financial Resource  Strain: Not on file  Food Insecurity: Not on file  Transportation Needs: Not on file  Physical Activity: Not on file  Stress: Not on file  Social Connections: Not on file   Additional Social History:                         Sleep: Good  Appetite:  Good  Current Medications: Current Facility-Administered Medications  Medication Dose Route Frequency Provider Last Rate Last Admin   acetaminophen (TYLENOL) tablet 650 mg  650 mg Oral Q6H PRN Deloria Lair, NP       alum & mag hydroxide-simeth (MAALOX/MYLANTA) 200-200-20 MG/5ML suspension 30 mL  30 mL Oral Q4H PRN Dixon, Rashaun M, NP       LORazepam (ATIVAN) tablet 1 mg  1 mg Oral Q4H PRN Parks Ranger, DO       magnesium hydroxide (MILK OF MAGNESIA) suspension 30 mL  30 mL Oral Daily PRN Deloria Lair, NP       mirtazapine (REMERON) tablet 15 mg  15 mg Oral QHS Roselie Cirigliano Edward, DO       OLANZapine [facility]) tablet 5 mg  5 mg Oral Q4H PRN Parks Ranger, DO       risperiDONE (RISPERDAL) tablet  0.5 mg  0.5 mg Oral [id number] Parks Ranger, DO   0.5 mg at 06/03/21 0805   traZODone (DESYREL) tablet 100 mg  100 mg Oral QHS PRN Parks Ranger, DO        Lab Results:  Results for orders placed or performed during the hospital encounter of 06/01/21 (from the past 48 hour(s))  Resp Panel by RT-PCR (Flu A&B, Covid) Nasopharyngeal Swab     Status: None   Collection Time: 06/01/21  7:54 PM   Specimen: Nasopharyngeal Swab; Nasopharyngeal(NP) swabs in vial transport medium  Result Value Ref Range   SARS Coronavirus 2 by RT PCR NEGATIVE NEGATIVE    Comment: (NOTE) SARS-CoV-2 target nucleic acids are NOT DETECTED.  The SARS-CoV-2 RNA is generally detectable in upper respiratory specimens during the acute phase of infection. The lowest concentration of SARS-CoV-2 viral copies this assay can detect is 138 copies/mL. A negative result does not preclude SARS-Cov-2 infection and should not  be used as the sole basis for treatment or other patient management decisions. A negative result may occur with  improper specimen collection/handling, submission of specimen other than nasopharyngeal swab, presence of viral mutation(s) within the areas targeted by this assay, and inadequate number of viral copies(<138 copies/mL). A negative result must be combined with clinical observations, patient history, and epidemiological information. The expected result is Negative.  Fact Sheet for Patients:  EntrepreneurPulse.com.au  Fact Sheet for Healthcare Providers:  IncredibleEmployment.be  This test is no t yet approved or cleared by the [place] FDA and  has been authorized for detection and/or diagnosis of SARS-CoV-2 by FDA under an Emergency Use Authorization (EUA). This EUA will remain  in effect (meaning this test can be used) for the duration of the COVID-19 declaration under Section 564(b)(1) of the Act, 21 U.S.C.section 360bbb-3(b)(1), unless the authorization is terminated  or revoked sooner.       Influenza A by PCR NEGATIVE NEGATIVE   Influenza B by PCR NEGATIVE NEGATIVE    Comment: (NOTE) The Xpert Xpress SARS-CoV-2/FLU/RSV plus assay is intended as an aid in the diagnosis of influenza from Nasopharyngeal swab specimens and should not be used as a sole basis for treatment. Nasal washings and aspirates are unacceptable for Xpert Xpress SARS-CoV-2/FLU/RSV testing.  Fact Sheet for Patients: EntrepreneurPulse.com.au  Fact Sheet for Healthcare Providers: IncredibleEmployment.be  This test is not yet approved or cleared by the [place] FDA and has been authorized for detection and/or diagnosis of SARS-CoV-2 by FDA under an Emergency Use Authorization (EUA). This EUA will remain in effect (meaning this test can be used) for the duration of the COVID-19 declaration under Section 564(b)(1) of  the Act, 21 U.S.C. section 360bbb-3(b)(1), unless the authorization is terminated or revoked.  Performed at [facility], [address] [place],  [id number]   Basic metabolic panel     Status: Abnormal   Collection Time: 06/01/21  8:07 PM  Result Value Ref Range   Sodium 139 135 - 145 mmol/L   Potassium 3.5 3.5 - 5.1 mmol/L   Chloride 105 98 - 111 mmol/L   CO2 26 22 - 32 mmol/L   Glucose, Bld 129 (H) 70 - 99 mg/dL    Comment: Glucose reference range applies only to samples taken after fasting for at least 8 hours.   BUN 10 6 - 20 mg/dL   Creatinine, Ser 0.58 0.44 - 1.00 mg/dL   Calcium 9.6 8.9 - 10.3 mg/dL   GFR, Estimated >60 >60 mL/min  Comment: (NOTE) Calculated using the CKD-EPI Creatinine Equation (2021)    Anion gap 8 5 - 15    Comment: Performed at [facility], [place], [place] [address]  Troponin I (High Sensitivity)     Status: None   Collection Time: 06/01/21  8:07 PM  Result Value Ref Range   Troponin I (High Sensitivity) 4 <18 ng/L    Comment: (NOTE) Elevated high sensitivity troponin I (hsTnI) values and significant  changes across serial measurements may suggest ACS but many other  chronic and acute conditions are known to elevate hsTnI results.  Refer to the "Links" section for chest pain algorithms and additional  guidance. Performed at [facility], [address] [place], [place] [address]   Urinalysis, Routine w reflex microscopic Urine, Clean Catch     Status: Abnormal   Collection Time: 06/01/21  8:07 PM  Result Value Ref Range   Color, Urine YELLOW (A) YELLOW   APPearance HAZY (A) CLEAR   Specific Gravity, Urine 1.018 1.005 - 1.030   pH 6.0 5.0 - 8.0   Glucose, UA NEGATIVE NEGATIVE mg/dL   Hgb urine dipstick MODERATE (A) NEGATIVE   Bilirubin Urine NEGATIVE NEGATIVE   Ketones, ur 5 (A) NEGATIVE mg/dL   Protein, ur NEGATIVE NEGATIVE mg/dL   Nitrite NEGATIVE NEGATIVE   Leukocytes,Ua TRACE  (A) NEGATIVE   RBC / HPF 0-5 0 - 5 RBC/hpf   WBC, UA 0-5 0 - 5 WBC/hpf   Bacteria, UA MANY (A) NONE SEEN   Squamous Epithelial / LPF 0-5 0 - 5   Mucus PRESENT     Comment: Performed at [facility], [address] [place], [place] [address]  Urine Drug Screen, Qualitative     Status: Abnormal   Collection Time: 06/01/21  8:07 PM  Result Value Ref Range   Tricyclic, Ur Screen NONE DETECTED NONE DETECTED   Amphetamines, Ur Screen NONE DETECTED NONE DETECTED   MDMA (Ecstasy)Ur Screen NONE DETECTED NONE DETECTED   Cocaine Metabolite,Ur [place] NONE DETECTED NONE DETECTED   Opiate, Ur Screen NONE DETECTED NONE DETECTED   Phencyclidine (PCP) Ur S NONE DETECTED NONE DETECTED   Cannabinoid 50 Ng, Ur  POSITIVE (A) NONE DETECTED   Barbiturates, Ur Screen NONE DETECTED NONE DETECTED   Benzodiazepine, Ur Scrn NONE DETECTED NONE DETECTED   Methadone Scn, Ur NONE DETECTED NONE DETECTED    Comment: (NOTE) Tricyclics + metabolites, urine    Cutoff 1000 ng/mL Amphetamines + metabolites, urine  Cutoff 1000 ng/mL MDMA (Ecstasy), urine              Cutoff 500 ng/mL Cocaine Metabolite, urine          Cutoff 300 ng/mL Opiate + metabolites, urine        Cutoff 300 ng/mL Phencyclidine (PCP), urine         Cutoff 25 ng/mL Cannabinoid, urine                 Cutoff 50 ng/mL Barbiturates + metabolites, urine  Cutoff 200 ng/mL Benzodiazepine, urine              Cutoff 200 ng/mL Methadone, urine                   Cutoff 300 ng/mL  The urine drug screen provides only a preliminary, unconfirmed analytical test result and should not be used for non-medical purposes. Clinical consideration and professional judgment should be applied to any positive drug screen result  due to possible interfering substances. A more specific alternate chemical method must be used in order to obtain a confirmed analytical result. Gas chromatography / mass spectrometry (GC/MS) is the preferred confirm atory  method. Performed at [facility], [address] [place], [place] [address]   Pregnancy, urine     Status: None   Collection Time: 06/01/21  8:07 PM  Result Value Ref Range   Preg Test, Ur NEGATIVE NEGATIVE    Comment: Performed at [facility], [address] [place], [place] [address]  CBC     Status: Abnormal   Collection Time: 06/01/21  8:09 PM  Result Value Ref Range   WBC 11.1 (H) 4.0 - 10.5 K/uL   RBC 3.69 (L) 3.87 - 5.11 MIL/uL   Hemoglobin 11.8 (L) 12.0 - 15.0 g/dL   HCT 34.9 (L) 36.0 - 46.0 %   MCV 94.6 80.0 - 100.0 fL   MCH 32.0 26.0 - 34.0 pg   MCHC 33.8 30.0 - 36.0 g/dL   RDW 12.7 11.5 - 15.5 %   Platelets 336 150 - 400 K/uL   nRBC 0.0 0.0 - 0.2 %    Comment: Performed at [facility], [address] [place], [place] [address]    Blood Alcohol level:  No results found for: [facility]  Metabolic Disorder Labs: No results found for: HGBA1C, MPG No results found for: PROLACTIN Lab Results  Component Value Date   CHOL 152 05/24/2021   TRIG 34.0 05/24/2021   HDL 68.90 05/24/2021   CHOLHDL 2 05/24/2021   VLDL 6.8 05/24/2021   LDLCALC 76 05/24/2021   LDLCALC 66 12/16/2019    Physical Findings: AIMS:  , ,  ,  ,    CIWA:    COWS:     Musculoskeletal: Strength & Muscle Tone: within normal limits Gait & Station: normal Patient leans: N/A  Psychiatric Specialty Exam:  Presentation  General Appearance: Bizarre  Eye Contact:Absent  Speech:Blocked  Speech Volume:Decreased  Handedness:No data recorded  Mood and Affect  Mood:Dysphoric  Affect:Constricted; Restricted   Thought Process  Thought Processes:Disorganized  Descriptions of Associations:No data recorded Orientation:Other (comment) (noncommunicative)  Thought Content:Scattered  History of Schizophrenia/Schizoaffective disorder:No  Duration of Psychotic Symptoms:No data recorded Hallucinations:Hallucinations: Other (comment) (unable to assess)  Ideas of  Reference:-- (unable to assess)  Suicidal Thoughts:Suicidal Thoughts: No  Homicidal Thoughts:Homicidal Thoughts: No   Sensorium  Memory:-- (unable to assess)  Judgment:Impaired  Insight:None   Executive Functions  Concentration:Poor  Attention Span:Poor  Recall:Poor  Fund of Knowledge:Poor  Language:Poor   Psychomotor Activity  Psychomotor Activity:Psychomotor Activity: Restlessness   Assets  Assets:Desire for Improvement; Financial Resources/Insurance; Housing; Social Support   Sleep  Sleep:Sleep: Poor Number of Hours of Sleep: 0    Physical Exam: Physical Exam Vitals and nursing note reviewed.  Constitutional:      Appearance: Normal appearance. She is normal weight.  Neurological:     General: No focal deficit present.     Mental Status: She is alert and oriented to person, place, and time.  Psychiatric:        Attention and Perception: Attention and perception normal.        Mood and Affect: Mood is anxious and depressed.        Speech: Speech normal.        Behavior: Behavior normal. Behavior is cooperative.        Thought Content: Thought content normal.        Cognition and Memory: Cognition  and memory normal.        Judgment: Judgment normal.   Review of Systems  Constitutional: Negative.   HENT: Negative.    Eyes: Negative.   Respiratory: Negative.    Cardiovascular: Negative.   Gastrointestinal: Negative.   Genitourinary: Negative.   Musculoskeletal: Negative.   Skin: Negative.   Neurological: Negative.   Endo/Heme/Allergies: Negative.   Psychiatric/Behavioral:  Positive for depression. The patient has insomnia.   Blood pressure 120/74, pulse 91, temperature 98.8 F (37.1 C), temperature source Oral, resp. rate 17, height 5\' 3"  (1.6 m), weight 68.5 kg, SpO2 100 %. Body mass index is 26.75 kg/m.   Treatment Plan Summary: Daily contact with patient to assess and evaluate symptoms and progress in treatment, Medication management, and  Plan continue current medications.  [place], DO 06/03/2021, 1:19 PM

## 2021-06-03 NOTE — Progress Notes (Signed)
Pt is in community room eating breakfast. Pt appears calm and cooperative with sitter at side. Pt continues to be on 1:1. Pt is safe at this time.

## 2021-06-03 NOTE — Progress Notes (Signed)
1:1 has been discontinue, pt is alert, oriented, and behaving appropriately. Pt is calm and cooperative, will continue to monitor.

## 2021-06-03 NOTE — [facility] Suicide Risk Assessment (Signed)
[facility] INPATIENT:  Family/Significant Other Suicide Prevention Education  Suicide Prevention Education:  Patient Refusal for Family/Significant Other Suicide Prevention Education: The patient Tonya Watkins has refused to provide written consent for family/significant other to be provided Family/Significant Other Suicide Prevention Education during admission and/or prior to discharge.  Physician notified.  SPE completed with patient.   Corky Crafts 06/03/2021, 11:33 AM

## 2021-06-04 LAB — LIPID PANEL
Cholesterol: 148 mg/dL (ref 0–200)
HDL: 71 mg/dL (ref >40)
LDL Cholesterol: 71 mg/dL (ref 0–99)
Total CHOL/HDL Ratio: 2.1 RATIO
Triglycerides: 31 mg/dL (ref <150)
VLDL: 6 mg/dL (ref 0–40)

## 2021-06-04 MED ORDER — BUPROPION HCL ER (XL) 150 MG PO TB24: 300.0000 mg | ORAL_TABLET | Freq: Every day | ORAL | Status: DC

## 2021-06-04 NOTE — Progress Notes (Signed)
[facility] MD Progress Note  06/04/2021 5:08 PM Tonya Watkins  MRN:  [id number] Subjective: Patient seen and chart reviewed.  51 year old woman who presented to the emergency room with physical complaints as well as complaints of not sleeping for an extended period of time.  Evidently became disorganized and showing odd behavior in the emergency room and was admitted to psychiatry.  On interview today patient says that she had not slept well for a couple of weeks.  She attributes this to multiple life stresses including the fact that her long-term fianc had recently broken up with her and left the home.  Patient says she is feeling much better now.  She denies being depressed.  Denies having at any point any suicidal thought.  Denies any hallucinations.  Tends to be very dismissive of ideas of any larger problem.  Somewhat defensive attitude.  Did not however appear to be disorganized or with overt psychosis no evidence of dangerousness. Principal Problem: Acute psychosis ([place]) Diagnosis: Principal Problem:   Acute psychosis ([place])  Total Time spent with patient: 30 minutes  Past Psychiatric History: Has been treated with bupropion for depression for several years.  Denies any history of bipolar disorder.  Denies any previous hospitalization denies any previous psychosis  Past Medical History:  Past Medical History:  Diagnosis Date   DVT (deep venous thrombosis) (HCC)    left calf   Heart palpitations    Medical history non-contributory    History reviewed. No pertinent surgical history. Family History:  Family History  Problem Relation Age of Onset   Allergies Mother    Cancer Mother        thyroid   Family Psychiatric  History: See previous Social History:  Social History   Substance and Sexual Activity  Alcohol Use No     Social History   Substance and Sexual Activity  Drug Use No    Social History   Socioeconomic History   Marital status: Divorced    Spouse name: Not on file    Number of children: 3   Years of education: Not on file   Highest education level: Not on file  Occupational History   Occupation: Therapist, sports  Tobacco Use   Smoking status: Never   Smokeless tobacco: Never  Vaping Use   Vaping Use: Unknown  Substance and Sexual Activity   Alcohol use: No   Drug use: No   Sexual activity: Yes  Other Topics Concern   Not on file  Social History Narrative   Not on file   Social Determinants of Health   Financial Resource Strain: Not on file  Food Insecurity: Not on file  Transportation Needs: Not on file  Physical Activity: Not on file  Stress: Not on file  Social Connections: Not on file   Additional Social History:                         Sleep: Fair  Appetite:  Fair  Current Medications: Current Facility-Administered Medications  Medication Dose Route Frequency Provider Last Rate Last Admin   acetaminophen (TYLENOL) tablet 650 mg  650 mg Oral Q6H PRN Deloria Lair, NP       alum & mag hydroxide-simeth (MAALOX/MYLANTA) 200-200-20 MG/5ML suspension 30 mL  30 mL Oral Q4H PRN Dixon, Rashaun M, NP       [START ON 06/05/2021] buPROPion (WELLBUTRIN XL) 24 hr tablet 300 mg  300 mg Oral Daily Tamyah Cutbirth, Madie Reno, MD  LORazepam (ATIVAN) tablet 1 mg  1 mg Oral Q4H PRN Parks Ranger, DO       magnesium hydroxide (MILK OF MAGNESIA) suspension 30 mL  30 mL Oral Daily PRN Deloria Lair, NP        Lab Results: No results found for this or any previous visit (from the past 48 hour(s)).  Blood Alcohol level:  No results found for: [facility]  Metabolic Disorder Labs: No results found for: HGBA1C, MPG No results found for: PROLACTIN Lab Results  Component Value Date   CHOL 152 05/24/2021   TRIG 34.0 05/24/2021   HDL 68.90 05/24/2021   CHOLHDL 2 05/24/2021   VLDL 6.8 05/24/2021   LDLCALC 76 05/24/2021   LDLCALC 66 12/16/2019    Physical Findings: AIMS:  , ,  ,  ,    CIWA:    COWS:     Musculoskeletal: Strength & Muscle  Tone: within normal limits Gait & Station: normal Patient leans: N/A  Psychiatric Specialty Exam:  Presentation  General Appearance: Bizarre  Eye Contact:Absent  Speech:Blocked  Speech Volume:Decreased  Handedness:No data recorded  Mood and Affect  Mood:Dysphoric  Affect:Constricted; Restricted   Thought Process  Thought Processes:Disorganized  Descriptions of Associations:No data recorded Orientation:Other (comment) (noncommunicative)  Thought Content:Scattered  History of Schizophrenia/Schizoaffective disorder:No  Duration of Psychotic Symptoms:No data recorded Hallucinations:No data recorded Ideas of Reference:-- (unable to assess)  Suicidal Thoughts:No data recorded Homicidal Thoughts:No data recorded  Sensorium  Memory:-- (unable to assess)  Judgment:Impaired  Insight:None   Executive Functions  Concentration:Poor  Attention Span:Poor  Recall:Poor  Fund of Knowledge:Poor  Language:Poor   Psychomotor Activity  Psychomotor Activity:No data recorded  Assets  Assets:Desire for Improvement; Financial Resources/Insurance; Housing; Social Support   Sleep  Sleep:No data recorded   Physical Exam: Physical Exam Vitals and nursing note reviewed.  Constitutional:      Appearance: Normal appearance.  HENT:     Head: Normocephalic and atraumatic.     Mouth/Throat:     Pharynx: Oropharynx is clear.  Eyes:     Pupils: Pupils are equal, round, and reactive to light.  Cardiovascular:     Rate and Rhythm: Normal rate and regular rhythm.  Pulmonary:     Effort: Pulmonary effort is normal.     Breath sounds: Normal breath sounds.  Abdominal:     General: Abdomen is flat.     Palpations: Abdomen is soft.  Musculoskeletal:        General: Normal range of motion.  Skin:    General: Skin is warm and dry.  Neurological:     General: No focal deficit present.     Mental Status: She is alert. Mental status is at baseline.  Psychiatric:         Attention and Perception: Attention normal.        Mood and Affect: Mood normal. Affect is blunt.        Speech: Speech normal.        Behavior: Behavior is cooperative.        Thought Content: Thought content normal.        Cognition and Memory: Cognition normal.   Review of Systems  Constitutional: Negative.   HENT: Negative.    Eyes: Negative.   Respiratory: Negative.    Cardiovascular: Negative.   Gastrointestinal: Negative.   Musculoskeletal: Negative.   Skin: Negative.   Neurological: Negative.   Psychiatric/Behavioral:  Negative for depression, hallucinations, substance abuse and suicidal ideas. The patient has insomnia.  Blood pressure (!) 137/92, pulse 85, temperature 98.9 F (37.2 C), temperature source Oral, resp. rate 18, height 5\' 3"  (1.6 m), weight 68.5 kg, SpO2 100 %. Body mass index is 26.75 kg/m.   Treatment Plan Summary: Plan patient had been taken off bupropion the last couple days and was given some antipsychotic medicine.  At this point however she is denying any psychotic symptoms and is requesting discharge.  Patient's understanding of the situation is just that she had had poor sleep and a lot of stress.  No past history to suggest bipolar disorder.  No evidence of stimulant abuse.  I would discontinue the antipsychotic medicine.  Discussed with the patient the concerns about bipolar disorder or substance abuse or a new psychotic episode.  We will restart just her usual outpatient medicines.  Reassess tomorrow for possible discharge  Alethia Berthold, MD 06/04/2021, 5:08 PM

## 2021-06-04 NOTE — BH IP Treatment Plan (Signed)
Interdisciplinary Treatment and Diagnostic Plan Update  06/04/2021 Time of Session: 9:30 AM Tonya Watkins MRN: [id number]  Principal Diagnosis: Acute psychosis [facility])  Secondary Diagnoses: Principal Problem:   Acute psychosis ([place])   Current Medications:  Current Facility-Administered Medications  Medication Dose Route Frequency Provider Last Rate Last Admin   acetaminophen (TYLENOL) tablet 650 mg  650 mg Oral Q6H PRN Deloria Lair, NP       alum & mag hydroxide-simeth (MAALOX/MYLANTA) 200-200-20 MG/5ML suspension 30 mL  30 mL Oral Q4H PRN Dixon, Rashaun M, NP       LORazepam (ATIVAN) tablet 1 mg  1 mg Oral Q4H PRN Parks Ranger, DO       magnesium hydroxide (MILK OF MAGNESIA) suspension 30 mL  30 mL Oral Daily PRN Deloria Lair, NP       mirtazapine (REMERON) tablet 15 mg  15 mg Oral QHS Herrick, Richard Edward, DO       OLANZapine [facility]) tablet 5 mg  5 mg Oral Q4H PRN Parks Ranger, DO       risperiDONE (RISPERDAL) tablet 0.5 mg  0.5 mg Oral [id number] Parks Ranger, DO   0.5 mg at 06/03/21 1624   traZODone (DESYREL) tablet 100 mg  100 mg Oral QHS PRN Parks Ranger, DO       PTA Medications: Medications Prior to Admission  Medication Sig Dispense Refill Last Dose   aspirin 325 MG tablet Take 2 tablets (650 mg total) by mouth daily. For headaches 100 tablet 3    buPROPion (WELLBUTRIN XL) 300 MG 24 hr tablet Take 1 tablet by mouth once daily 30 tablet 5    clonazePAM (KLONOPIN) 0.25 MG disintegrating tablet Take 1 tablet (0.25 mg total) by mouth 2 (two) times daily as needed. 60 tablet 1    Multiple Vitamin (MULTIVITAMIN WITH MINERALS) TABS Take 1 tablet by mouth daily.      phentermine (ADIPEX-P) 37.5 MG tablet TAKE 1 TABLET BY MOUTH ONCE DAILY BEFORE BREAKFAST 30 tablet 2    valACYclovir (VALTREX) 500 MG tablet Take 1 tablet by mouth once daily 30 tablet 5     Patient Stressors: Loss of relationship    Medication change or  noncompliance    Patient Strengths: Ability for insight  General fund of knowledge  Motivation for treatment/growth   Treatment Modalities: Medication Management, Group therapy, Case management,  1 to 1 session with clinician, Psychoeducation, Recreational therapy.   Physician Treatment Plan for Primary Diagnosis: Acute psychosis ([place]) Long Term Goal(s): Improvement in symptoms so as ready for discharge   Short Term Goals: Ability to identify changes in lifestyle to reduce recurrence of condition will improve Ability to verbalize feelings will improve Ability to disclose and discuss suicidal ideas Ability to demonstrate self-control will improve Ability to identify and develop effective coping behaviors will improve Ability to maintain clinical measurements within normal limits will improve Compliance with prescribed medications will improve Ability to identify triggers associated with substance abuse/mental health issues will improve  Medication Management: Evaluate patient's response, side effects, and tolerance of medication regimen.  Therapeutic Interventions: 1 to 1 sessions, Unit Group sessions and Medication administration.  Evaluation of Outcomes: Not Met  Physician Treatment Plan for Secondary Diagnosis: Principal Problem:   Acute psychosis ([place])  Long Term Goal(s): Improvement in symptoms so as ready for discharge   Short Term Goals: Ability to identify changes in lifestyle to reduce recurrence of condition will improve Ability to verbalize feelings will improve Ability to  disclose and discuss suicidal ideas Ability to demonstrate self-control will improve Ability to identify and develop effective coping behaviors will improve Ability to maintain clinical measurements within normal limits will improve Compliance with prescribed medications will improve Ability to identify triggers associated with substance abuse/mental health issues will improve     Medication  Management: Evaluate patient's response, side effects, and tolerance of medication regimen.  Therapeutic Interventions: 1 to 1 sessions, Unit Group sessions and Medication administration.  Evaluation of Outcomes: Not Met   RN Treatment Plan for Primary Diagnosis: Acute psychosis ([place]) Long Term Goal(s): Knowledge of disease and therapeutic regimen to maintain health will improve  Short Term Goals: Ability to demonstrate self-control, Ability to participate in decision making will improve, Ability to verbalize feelings will improve, Ability to disclose and discuss suicidal ideas, Ability to identify and develop effective coping behaviors will improve, and Compliance with prescribed medications will improve  Medication Management: RN will administer medications as ordered by provider, will assess and evaluate patient's response and provide education to patient for prescribed medication. RN will report any adverse and/or side effects to prescribing provider.  Therapeutic Interventions: 1 on 1 counseling sessions, Psychoeducation, Medication administration, Evaluate responses to treatment, Monitor vital signs and CBGs as ordered, Perform/monitor CIWA, COWS, AIMS and Fall Risk screenings as ordered, Perform wound care treatments as ordered.  Evaluation of Outcomes: Not Met   LCSW Treatment Plan for Primary Diagnosis: Acute psychosis ([place]) Long Term Goal(s): Safe transition to appropriate next level of care at discharge, Engage patient in therapeutic group addressing interpersonal concerns.  Short Term Goals: Engage patient in aftercare planning with referrals and resources, Increase social support, Increase ability to appropriately verbalize feelings, Increase emotional regulation, Facilitate acceptance of mental health diagnosis and concerns, and Increase skills for wellness and recovery  Therapeutic Interventions: Assess for all discharge needs, 1 to 1 time with Social worker, Explore available  resources and support systems, Assess for adequacy in community support network, Educate family and significant other(s) on suicide prevention, Complete Psychosocial Assessment, Interpersonal group therapy.  Evaluation of Outcomes: Not Met   Progress in Treatment: Attending groups: No. Participating in groups: No. Taking medication as prescribed: Yes. Toleration medication: Yes. Family/Significant other contact made: No, will contact:  once permission is given.` Patient understands diagnosis: Yes. Discussing patient identified problems/goals with staff: Yes. Medical problems stabilized or resolved: Yes. Denies suicidal/homicidal ideation: Yes. Issues/concerns per patient self-inventory: No. Other: none  New problem(s) identified: No, Describe:  none  New Short Term/Long Term Goal(s): elimination of symptoms of psychosis, medication management for mood stabilization; elimination of SI thoughts; development of comprehensive mental wellness/sobriety plan.   Patient Goals:  "getting home as soon as possible"  Discharge Plan or Barriers: CSW will assist patient in developing appropriate discharge plans.    Reason for Continuation of Hospitalization: Anxiety Depression Medication stabilization Suicidal ideation  Estimated Length of Stay:  1-7 days   Scribe for Treatment Team: Gates Rigg 06/04/2021 2:32 PM

## 2021-06-04 NOTE — Plan of Care (Signed)
Patient is active in the milieu and compliant with treatment plan. No sign of distress noted.

## 2021-06-04 NOTE — Progress Notes (Signed)
Pt was awake until about 230 am reading her Bible loud.

## 2021-06-04 NOTE — Progress Notes (Signed)
Patient calm and cooperative during assessment denying SI/HI/AVH. Pt observed interacting appropriately with staff and peers on the unit. Pt didn't have any medications scheduled tonight and hasn't requested anything PRN. Pt being monitored Q 15 minutes for safety per unit protocol. Pt remains safe on the unit.

## 2021-06-04 NOTE — Progress Notes (Signed)
Patient out of bed, calm and cooperative and reporting improvement. Pleasant and states "I feel so much better...".  Reports that sleep is much improved. Patient ate breakfast. Refused Risperidone reporting that she does  not tolerate it. No additional concerns. Encouragements and support provided. Safety precautions maintained.

## 2021-06-04 NOTE — Plan of Care (Signed)

## 2021-06-04 NOTE — Progress Notes (Signed)
Pt in bed at the beginning of the shift and she stayed in her room until RN prompted her to come out of her room. Pt refused her Remeron, she stated, "I don't like the way it makes me feel." I told her to discuss it with her doctor in the am. Pt stated she is here because she was tired. She denies Si/HI and AVH. Pt stated, "I just broke up from his fiance, and it was mutual. I put my kids first and that was a problem. At home I have my 50 yo  daughter, 71 yo son and my  50 yo son. My 100 yo works from home and my 100 yo is actively looking for job. Presently my mother is staying with me to help out and she will stay with me after discharge." Pt briefly out of room, reading a book, but no interaction with peers or staff. Pt observed sitting in a chair in the dark and then later came up to the nursing station and asked for the works to the "Lord's prayer." She denied having any SI thoughts and contracts for safety. We was also observed being in bathroom fully clothed and with the Lord's prayer for 30 minutes. I asked her if she need anything for anxiety or sleep and she said  "no."

## 2021-06-04 NOTE — Group Note (Cosign Needed)
[facility] LCSW Group Therapy Note    Group Date: 06/04/2021 Start Time: 0100 End Time: 0200  Type of Therapy and Topic:  Group Therapy:  Overcoming Obstacles  Participation Level:  [facility] PARTICIPATION LEVEL: None   Description of Group:   In this group patients will be encouraged to explore what they see as obstacles to their own wellness and recovery. They will be guided to discuss their thoughts, feelings, and behaviors related to these obstacles. The group will process together ways to cope with barriers, with attention given to specific choices patients can make. Each patient will be challenged to identify changes they are motivated to make in order to overcome their obstacles. This group will be process-oriented, with patients participating in exploration of their own experiences as well as giving and receiving support and challenge from other group members.  Therapeutic Goals: 1. Patient will identify personal and current obstacles as they relate to admission. 2. Patient will identify barriers that currently interfere with their wellness or overcoming obstacles.  3. Patient will identify feelings, thought process and behaviors related to these barriers. 4. Patient will identify two changes they are willing to make to overcome these obstacles:    Summary of Patient Progress   X   Therapeutic Modalities:   Cognitive Behavioral Therapy Solution Focused Therapy Motivational Interviewing Relapse Prevention Therapy   Starleen Arms, Student-Social Work

## 2021-06-05 ENCOUNTER — Encounter: Payer: 59 | Admitting: Internal Medicine

## 2021-06-05 LAB — HEMOGLOBIN A1C
Hgb A1c MFr Bld: 5.5 % (ref 4.8–5.6)
Mean Plasma Glucose: 111 mg/dL

## 2021-06-05 NOTE — BHH Suicide Risk Assessment (Signed)
[facility] Discharge Suicide Risk Assessment   Principal Problem: Acute psychosis ([place]) Discharge Diagnoses: Principal Problem:   Acute psychosis ([place])   Total Time spent with patient: 45 minutes  Musculoskeletal: Strength & Muscle Tone: within normal limits Gait & Station: normal Patient leans: N/A  Psychiatric Specialty Exam  Presentation  General Appearance: Bizarre  Eye Contact:Absent  Speech:Blocked  Speech Volume:Decreased  Handedness:No data recorded  Mood and Affect  Mood:Dysphoric  Duration of Depression Symptoms: Greater than two weeks  Affect:Constricted; Restricted   Thought Process  Thought Processes:Disorganized  Descriptions of Associations:No data recorded Orientation:Other (comment) (noncommunicative)  Thought Content:Scattered  History of Schizophrenia/Schizoaffective disorder:No  Duration of Psychotic Symptoms:No data recorded Hallucinations:No data recorded Ideas of Reference:-- (unable to assess)  Suicidal Thoughts:No data recorded Homicidal Thoughts:No data recorded  Sensorium  Memory:-- (unable to assess)  Judgment:Impaired  Insight:None   Executive Functions  Concentration:Poor  Attention Span:Poor  Recall:Poor  Fund of Knowledge:Poor  Language:Poor   Psychomotor Activity  Psychomotor Activity:No data recorded  Assets  Assets:Desire for Improvement; Financial Resources/Insurance; Housing; Social Support   Sleep  Sleep:No data recorded  Physical Exam: Physical Exam Vitals and nursing note reviewed.  Constitutional:      Appearance: Normal appearance.  HENT:     Head: Normocephalic and atraumatic.     Mouth/Throat:     Pharynx: Oropharynx is clear.  Eyes:     Pupils: Pupils are equal, round, and reactive to light.  Cardiovascular:     Rate and Rhythm: Normal rate and regular rhythm.  Pulmonary:     Effort: Pulmonary effort is normal.     Breath sounds: Normal breath sounds.  Abdominal:     General:  Abdomen is flat.     Palpations: Abdomen is soft.  Musculoskeletal:        General: Normal range of motion.  Skin:    General: Skin is warm and dry.  Neurological:     General: No focal deficit present.     Mental Status: She is alert. Mental status is at baseline.  Psychiatric:        Attention and Perception: Attention normal.        Mood and Affect: Mood normal.        Speech: Speech normal.        Behavior: Behavior is cooperative.        Thought Content: Thought content normal.        Cognition and Memory: Cognition normal.   Review of Systems  Constitutional: Negative.   HENT: Negative.    Eyes: Negative.   Respiratory: Negative.    Cardiovascular: Negative.   Gastrointestinal: Negative.   Musculoskeletal: Negative.   Skin: Negative.   Neurological: Negative.   Psychiatric/Behavioral: Negative.    Blood pressure (!) 123/55, pulse 69, temperature 98.4 F (36.9 C), temperature source Oral, resp. rate 17, height 5\' 3"  (1.6 m), weight 68.5 kg, SpO2 100 %. Body mass index is 26.75 kg/m.  Mental Status Per Nursing Assessment::   On Admission:  NA  Demographic Factors:  NA  Loss Factors: Loss of significant relationship  Historical Factors: Impulsivity  Risk Reduction Factors:   Sense of responsibility to family, Employed, Living with another person, especially a relative, Positive social support, and Positive therapeutic relationship  Continued Clinical Symptoms:  Depression:   Insomnia  Cognitive Features That Contribute To Risk:  None    Suicide Risk:  Minimal: No identifiable suicidal ideation.  Patients presenting with no risk factors but with morbid ruminations; may  be classified as minimal risk based on the severity of the depressive symptoms    Plan Of Care/Follow-up recommendations:  Patient is denying any suicidal ideation and has not displayed any dangerous behavior.  No indication to continue forced hospitalization as she is requesting discharge.   Patient is going to follow up with her regular outpatient physician but will also be encouraged to seek outpatient therapy.  Alethia Berthold, MD 06/05/2021, 11:28 AM

## 2021-06-05 NOTE — Plan of Care (Signed)
Pt out of bed and remains calm and cooperative. Reports that appetite is good. Sleep improved. Refused AM medications. Expressing readiness for discharge.

## 2021-06-05 NOTE — Progress Notes (Signed)
Patient discharged to her current home. Verbalized understanding of discharge instructions. Denied SI?hi/AVH. Had no sign of distress.

## 2021-06-05 NOTE — Progress Notes (Signed)
Recreation Therapy Notes   Date: 06/05/2021  Time: 10:05am    Location: Craft room      Behavioral response: N/A   Intervention Topic: Time Management    Discussion/Intervention: Patient refused to attend group.   Clinical Observations/Feedback:  Patient refused to attend group.    Eda Magnussen LRT/CTRS        Naleah Kofoed 06/05/2021 11:04 AM

## 2021-06-05 NOTE — BHH Counselor (Signed)
Patient declined assistance with setting up aftercare appointments.   Penni Homans, MSW, LCSW 06/05/2021 12:24 PM

## 2021-06-05 NOTE — Discharge Summary (Signed)
Physician Discharge Summary Note  Patient:  Tonya Watkins is an 51 y.o., female MRN:  [id number] DOB:  08/16/1970 Patient phone:  [phone] (home)  Patient address:   [address] [place] [address],  Total Time spent with patient: 45 minutes  Date of Admission:  06/02/2021 Date of Discharge: 06/05/2021  Reason for Admission: Presented to the emergency room with somatic symptoms and sleeplessness and was thought to be confused and somewhat disorganized and appropriate for inpatient treatment.  Principal Problem: Acute psychosis [facility]) Discharge Diagnoses: Principal Problem:   Acute psychosis ([place])   Past Psychiatric History: Past psychiatric history only of depression which had been treated with bupropion.  No history of psychosis reported or suicidal behavior  Past Medical History:  Past Medical History:  Diagnosis Date   DVT (deep venous thrombosis) (HCC)    left calf   Heart palpitations    Medical history non-contributory    History reviewed. No pertinent surgical history. Family History:  Family History  Problem Relation Age of Onset   Allergies Mother    Cancer Mother        thyroid   Family Psychiatric  History: See previous Social History:  Social History   Substance and Sexual Activity  Alcohol Use No     Social History   Substance and Sexual Activity  Drug Use No    Social History   Socioeconomic History   Marital status: Divorced    Spouse name: Not on file   Number of children: 3   Years of education: Not on file   Highest education level: Not on file  Occupational History   Occupation: RN  Tobacco Use   Smoking status: Never   Smokeless tobacco: Never  Vaping Use   Vaping Use: Unknown  Substance and Sexual Activity   Alcohol use: No   Drug use: No   Sexual activity: Yes  Other Topics Concern   Not on file  Social History Narrative   Not on file   Social Determinants of Health   Financial Resource Strain: Not on file   Food Insecurity: Not on file  Transportation Needs: Not on file  Physical Activity: Not on file  Stress: Not on file  Social Connections: Not on file    Hospital Course: Patient admitted to psychiatric ward.  Treated over the first day or so with some antipsychotic medicine and attempts to get her to get a good night's sleep.  Patient did not display any dangerous aggressive or violent behavior.  Antipsychotics have now been discontinued and she slept okay last night.  Patient is not reporting any hallucinations or delusions and denies any psychosis and states that she feels like she is back to her normal self.  She continues to appear to have a slight degree of evasiveness about her and gives just a little feeling of possibly being paranoid although nothing absolutely specific.  No indication of any dangerousness to justify forced hospitalization.  Patient was offered to continue her outpatient bupropion but refused her dose this morning.  She only gave the excuse that she wanted to talk to her primary care doctor about it and would not talk about it any further.  Patient has been educated that if her family continues to express concerns she should take them seriously and see her doctor or a therapist as soon as possible.  Also of sleep pattern remains irregular she needs to talk to her doctor about it or return to the hospital.  No prescriptions  provided at discharge.  Physical Findings: AIMS:  , ,  ,  ,    CIWA:    COWS:     Musculoskeletal: Strength & Muscle Tone: within normal limits Gait & Station: normal Patient leans: N/A   Psychiatric Specialty Exam:  Presentation  General Appearance: Bizarre  Eye Contact:Absent  Speech:Blocked  Speech Volume:Decreased  Handedness:No data recorded  Mood and Affect  Mood:Dysphoric  Affect:Constricted; Restricted   Thought Process  Thought Processes:Disorganized  Descriptions of Associations:No data recorded Orientation:Other  (comment) (noncommunicative)  Thought Content:Scattered  History of Schizophrenia/Schizoaffective disorder:No  Duration of Psychotic Symptoms:No data recorded Hallucinations:No data recorded Ideas of Reference:-- (unable to assess)  Suicidal Thoughts:No data recorded Homicidal Thoughts:No data recorded  Sensorium  Memory:-- (unable to assess)  Judgment:Impaired  Insight:None   Executive Functions  Concentration:Poor  Attention Span:Poor  Recall:Poor  Fund of Knowledge:Poor  Language:Poor   Psychomotor Activity  Psychomotor Activity:No data recorded  Assets  Assets:Desire for Improvement; Financial Resources/Insurance; Housing; Social Support   Sleep  Sleep:No data recorded   Physical Exam: Physical Exam Vitals and nursing note reviewed.  Constitutional:      Appearance: Normal appearance.  HENT:     Head: Normocephalic and atraumatic.     Mouth/Throat:     Pharynx: Oropharynx is clear.  Eyes:     Pupils: Pupils are equal, round, and reactive to light.  Cardiovascular:     Rate and Rhythm: Normal rate and regular rhythm.  Pulmonary:     Effort: Pulmonary effort is normal.     Breath sounds: Normal breath sounds.  Abdominal:     General: Abdomen is flat.     Palpations: Abdomen is soft.  Musculoskeletal:        General: Normal range of motion.  Skin:    General: Skin is warm and dry.  Neurological:     General: No focal deficit present.     Mental Status: She is alert. Mental status is at baseline.  Psychiatric:        Mood and Affect: Mood normal.        Thought Content: Thought content normal.   Review of Systems  Constitutional: Negative.   HENT: Negative.    Eyes: Negative.   Respiratory: Negative.    Cardiovascular: Negative.   Gastrointestinal: Negative.   Musculoskeletal: Negative.   Skin: Negative.   Neurological: Negative.   Psychiatric/Behavioral: Negative.    Blood pressure (!) 123/55, pulse 69, temperature 98.4 F (36.9  C), temperature source Oral, resp. rate 17, height 5\' 3"  (1.6 m), weight 68.5 kg, SpO2 100 %. Body mass index is 26.75 kg/m.   Social History   Tobacco Use  Smoking Status Never  Smokeless Tobacco Never   Tobacco Cessation:  N/A, patient does not currently use tobacco products   Blood Alcohol level:  No results found for: [facility]  Metabolic Disorder Labs:  No results found for: HGBA1C, MPG No results found for: PROLACTIN Lab Results  Component Value Date   CHOL 148 06/04/2021   TRIG 31 06/04/2021   HDL 71 06/04/2021   CHOLHDL 2.1 06/04/2021   VLDL 6 06/04/2021   LDLCALC 71 06/04/2021   LDLCALC 76 05/24/2021    See Psychiatric Specialty Exam and Suicide Risk Assessment completed by Attending Physician prior to discharge.  Discharge destination:  Home  Is patient on multiple antipsychotic therapies at discharge:  No   Has Patient had three or more failed trials of antipsychotic monotherapy by history:  No  Recommended Plan  for Multiple Antipsychotic Therapies: NA  Discharge Instructions     Diet - low sodium heart healthy   Complete by: As directed    Increase activity slowly   Complete by: As directed       Allergies as of 06/05/2021   No Known Allergies      Medication List     STOP taking these medications    aspirin 325 MG tablet   clonazePAM 0.25 MG disintegrating tablet Commonly known as: KLONOPIN   multivitamin with minerals Tabs tablet   phentermine 37.5 MG tablet Commonly known as: ADIPEX-P   valACYclovir 500 MG tablet Commonly known as: VALTREX       TAKE these medications      Indication  buPROPion 300 MG 24 hr tablet Commonly known as: WELLBUTRIN XL Take 1 tablet by mouth once daily  Indication: Major Depressive Disorder         Follow-up recommendations: See note above.  Follow-up with regular doctor follow-up with therapy and outpatient mental health care.  Comments: No new prescriptions  Signed: Alethia Berthold,  MD 06/05/2021, 11:31 AM

## 2021-06-05 NOTE — Progress Notes (Signed)
°  [facility] Adult Case Management Discharge Plan :  Will you be returning to the same living situation after discharge:  Yes,  pt reports she is returning home At discharge, do you have transportation home?: Yes,  pt reports mother will provide transportation Do you have the ability to pay for your medications: Yes,  [facility]  Release of information consent forms completed and in the chart;  Patient's signature needed at discharge.  Patient to Follow up at:  Follow-up Information     [facility] Follow up.   Why: Walk in hours are from 8AM to 4PM, Monday, Wednesday and Friday. Contact information: [address] [place] [address] [phone]         [facility] [employer] Follow up.   Why: Walk in hours are from 9AM to 4PM, Monday through Friday. Contact information: [address] [place] [address] [phone]                 Next level of care provider has access to [facility] Link:no  Safety Planning and Suicide Prevention discussed: Yes,  SPE completed with the patient     Has patient been referred to the Quitline?: Patient refused referral  Patient has been referred for addiction treatment: Pt. refused referral  Harden Mo, LCSW 06/05/2021, 12:25 PM

## 2021-06-05 NOTE — Group Note (Cosign Needed)
[facility] LCSW Group Therapy Note ° ° °Group Date: 06/05/2021 °Start Time: 0130 °End Time: 0215 ° °Type of Therapy/Topic:  Group Therapy:  Feelings about Diagnosis ° °Participation Level:  Did Not Attend  ° ° °Description of Group:   ° This group will allow patients to explore their thoughts and feelings about diagnoses they have received. Patients will be guided to explore their level of understanding and acceptance of these diagnoses. Facilitator will encourage patients to process their thoughts and feelings about the reactions of others to their diagnosis, and will guide patients in identifying ways to discuss their diagnosis with significant others in their lives. This group will be process-oriented, with patients participating in exploration of their own experiences as well as giving and receiving support and challenge from other group members. ° ° °Therapeutic Goals: °1. Patient will demonstrate understanding of diagnosis as evidence by identifying two or more symptoms of the disorder:  °2. Patient will be able to express two feelings regarding the diagnosis °3. Patient will demonstrate ability to communicate their needs through discussion and/or role plays ° °Summary of Patient Progress: ° °X ° ° ° ° ° °Therapeutic Modalities:   °Cognitive Behavioral Therapy °Brief Therapy °Feelings Identification  ° ° °Dean Wonder, Student-Social Work °

## 2021-06-06 ENCOUNTER — Other Ambulatory Visit: Payer: Self-pay

## 2021-06-06 ENCOUNTER — Encounter: Payer: Self-pay | Admitting: Emergency Medicine

## 2021-06-06 ENCOUNTER — Emergency Department (EMERGENCY_DEPARTMENT_HOSPITAL)
Admission: EM | Admit: 2021-06-06 | Discharge: 2021-06-07 | Disposition: A | Discharge status: Other Acute Inpatient Hospital | Payer: 59 | Source: Home / Self Care | Attending: Emergency Medicine | Admitting: Emergency Medicine

## 2021-06-06 DIAGNOSIS — F23 Brief psychotic disorder: Secondary | ICD-10-CM | POA: Diagnosis not present

## 2021-06-06 DIAGNOSIS — F29 Unspecified psychosis not due to a substance or known physiological condition: Secondary | ICD-10-CM | POA: Diagnosis present

## 2021-06-06 DIAGNOSIS — Z79899 Other long term (current) drug therapy: Secondary | ICD-10-CM | POA: Insufficient documentation

## 2021-06-06 DIAGNOSIS — Z20822 Contact with and (suspected) exposure to covid-19: Secondary | ICD-10-CM | POA: Insufficient documentation

## 2021-06-06 DIAGNOSIS — F323 Major depressive disorder, single episode, severe with psychotic features: Secondary | ICD-10-CM | POA: Diagnosis not present

## 2021-06-06 LAB — COMPREHENSIVE METABOLIC PANEL
ALT: 25 U/L (ref 0–44)
AST: 26 U/L (ref 15–41)
Albumin: 4.4 g/dL (ref 3.5–5.0)
Alkaline Phosphatase: 47 U/L (ref 38–126)
Anion gap: 8 (ref 5–15)
BUN: 8 mg/dL (ref 6–20)
CO2: 27 mmol/L (ref 22–32)
Calcium: 9.6 mg/dL (ref 8.9–10.3)
Chloride: 106 mmol/L (ref 98–111)
Creatinine, Ser: 0.75 mg/dL (ref 0.44–1.00)
GFR, Estimated: >60 mL/min (ref >60)
Glucose, Bld: 108 mg/dL — ABNORMAL HIGH (ref 70–99)
Potassium: 3.4 mmol/L — ABNORMAL LOW (ref 3.5–5.1)
Sodium: 141 mmol/L (ref 135–145)
Total Bilirubin: 0.6 mg/dL (ref 0.3–1.2)
Total Protein: 7.9 g/dL (ref 6.5–8.1)

## 2021-06-06 LAB — URINE DRUG SCREEN, QUALITATIVE (ARMC ONLY)
Amphetamines, Ur Screen: NOT DETECTED
Barbiturates, Ur Screen: NOT DETECTED
Benzodiazepine, Ur Scrn: NOT DETECTED
Cannabinoid 50 Ng, Ur ~~LOC~~: POSITIVE — AB
Cocaine Metabolite,Ur ~~LOC~~: NOT DETECTED
MDMA (Ecstasy)Ur Screen: NOT DETECTED
Methadone Scn, Ur: NOT DETECTED
Opiate, Ur Screen: NOT DETECTED
Phencyclidine (PCP) Ur S: NOT DETECTED
Tricyclic, Ur Screen: NOT DETECTED

## 2021-06-06 LAB — CBC
HCT: 36.8 % (ref 36.0–46.0)
Hemoglobin: 12.3 g/dL (ref 12.0–15.0)
MCH: 31.3 pg (ref 26.0–34.0)
MCHC: 33.4 g/dL (ref 30.0–36.0)
MCV: 93.6 fL (ref 80.0–100.0)
Platelets: 356 10*3/uL (ref 150–400)
RBC: 3.93 MIL/uL (ref 3.87–5.11)
RDW: 12.3 % (ref 11.5–15.5)
WBC: 5.4 10*3/uL (ref 4.0–10.5)
nRBC: 0 % (ref 0.0–0.2)

## 2021-06-06 LAB — PREGNANCY, URINE: Preg Test, Ur: NEGATIVE

## 2021-06-06 LAB — RESP PANEL BY RT-PCR (FLU A&B, COVID) ARPGX2
Influenza A by PCR: NEGATIVE
Influenza B by PCR: NEGATIVE
SARS Coronavirus 2 by RT PCR: NEGATIVE

## 2021-06-06 LAB — SALICYLATE LEVEL: Salicylate Lvl: <7 mg/dL — ABNORMAL LOW (ref 7.0–30.0)

## 2021-06-06 LAB — ETHANOL: Alcohol, Ethyl (B): <10 mg/dL (ref <10)

## 2021-06-06 LAB — ACETAMINOPHEN LEVEL: Acetaminophen (Tylenol), Serum: <10 ug/mL — ABNORMAL LOW (ref 10–30)

## 2021-06-06 MED ORDER — LORAZEPAM 1 MG PO TABS
1.0000 mg | ORAL_TABLET | ORAL | Status: DC | PRN
  Administered 2021-06-06 – 2021-06-07 (×2): 1 mg via ORAL
  Filled 2021-06-06 (×2): qty 1

## 2021-06-06 MED ORDER — HYDROXYZINE HCL 25 MG PO TABS: 25.0000 mg | ORAL_TABLET | Freq: Three times a day (TID) | ORAL | Status: DC | PRN

## 2021-06-06 MED ORDER — OLANZAPINE 5 MG PO TABS
5.0000 mg | ORAL_TABLET | Freq: Every day | ORAL | Status: DC
  Filled 2021-06-06: qty 1

## 2021-06-06 MED ORDER — LORAZEPAM 2 MG/ML IJ SOLN: 1.0000 mg | INTRAMUSCULAR | Status: DC | PRN

## 2021-06-06 MED ORDER — OLANZAPINE 5 MG PO TABS: 5.0000 mg | ORAL_TABLET | Freq: Two times a day (BID) | ORAL | Status: DC | PRN

## 2021-06-06 MED ORDER — OLANZAPINE 5 MG PO TABS
5.0000 mg | ORAL_TABLET | Freq: Two times a day (BID) | ORAL | Status: DC | PRN
  Administered 2021-06-06 – 2021-06-07 (×2): 5 mg via ORAL
  Filled 2021-06-06 (×2): qty 1

## 2021-06-06 NOTE — BH Assessment (Signed)
Patient is under review at [facility]. Possible admission on tomm 06/07/21.

## 2021-06-06 NOTE — ED Provider Notes (Signed)
[facility] Provider Note    Event Date/Time   First MD Initiated Contact with Patient 06/06/21 1215     (approximate)   History   Psychiatric Evaluation   HPI  Tonya Watkins is a 51 y.o. female who reports she is hearing voices.  She cannot stop them.  She has had this before but this is now getting quite bad she says.  She also says she is having trouble sleeping again.  She denies any nausea vomiting fever cough chills or any other complaints.  She denies any past medical history. Review of her old records show history of DVT and palpitations.     Physical Exam   Triage Vital Signs: ED Triage Vitals  Enc Vitals Group     BP 06/06/21 1132 (!) 136/97     Pulse Rate 06/06/21 1132 91     Resp 06/06/21 1132 16     Temp 06/06/21 1132 99.1 F (37.3 C)     Temp Source 06/06/21 1132 Oral     SpO2 06/06/21 1132 99 %     Weight 06/06/21 1132 150 lb (68 kg)     Height 06/06/21 1132 5\' 3"  (1.6 m)     Head Circumference --      Peak Flow --      Pain Score 06/06/21 1132 0     Pain Loc --      Pain Edu? --      Excl. in [place]? --     Most recent vital signs: Vitals:   06/06/21 1132  BP: (!) 136/97  Pulse: 91  Resp: 16  Temp: 99.1 F (37.3 C)  SpO2: 99%     General: Awake, alert, patient is talking to herself and when she talks to me she speaks in a very very low voice. CV:  Good peripheral perfusion.  Heart regular rate and rhythm no audible murmurs Resp:  Normal effort.  Lungs are clear Abd:  No distention.  Soft nontender no organomegaly Extremities: No edema    ED Results / Procedures / Treatments   Labs (all labs ordered are listed, but only abnormal results are displayed) Labs Reviewed  COMPREHENSIVE METABOLIC PANEL - Abnormal; Notable for the following components:      Result Value   Potassium 3.4 (*)    Glucose, Bld 108 (*)    All other components within normal limits  SALICYLATE LEVEL - Abnormal; Notable for the following  components:   Salicylate Lvl [id number] (*)    All other components within normal limits  ACETAMINOPHEN LEVEL - Abnormal; Notable for the following components:   Acetaminophen (Tylenol), Serum <10 (*)    All other components within normal limits  URINE DRUG SCREEN, QUALITATIVE ([facility] ONLY) - Abnormal; Notable for the following components:   Cannabinoid 50 Ng, Ur [place] POSITIVE (*)    All other components within normal limits  RESP PANEL BY RT-PCR (FLU A&B, COVID) ARPGX2  ETHANOL  CBC  PREGNANCY, URINE     EKG     RADIOLOGY    PROCEDURES:  Critical Care performed:   Procedures   MEDICATIONS ORDERED IN ED: Medications - No data to display   IMPRESSION / MDM / [place] / ED COURSE  I reviewed the triage vital signs and the nursing notes.  Patient says she is hearing voices and is responding to them.  She says that the voices are much louder and more consistent than before.  She is acting floridly psychotic.  She has been psychotic in the past. Patient was then seen by psychiatry and committed they will try to admit her downstairs.          FINAL CLINICAL IMPRESSION(S) / ED DIAGNOSES   Final diagnoses:  Psychosis, unspecified psychosis type ([place])     Rx / DC Orders   ED Discharge Orders     None        Note:  This document was prepared using Dragon voice recognition software and may include unintentional dictation errors.   Nena Polio, MD 06/06/21 [phone]

## 2021-06-06 NOTE — ED Notes (Addendum)
This rn spoke with the pts mother: this RN reported these notes from pt mother stating this is what happened the last week The pt got involved in some Internet group and read books about the universe is telling her something "mr universe is coming to see me and the and the wrong" pt is outside of the universe  Pt lived with fiance and he had different ideas of fathering and raising children and it got her upset.  Pt got into it 5 months ago and this ideations progresssed to these paranoid hallucinations.   Pt in bathtub "the portal will close if we all dont join in and well be stuck here forever"   Pt has not slept in a couple of weeks well either.

## 2021-06-06 NOTE — BH Assessment (Signed)
Comprehensive Clinical Assessment (CCA) Note  06/06/2021 Tonya Watkins [id number]  [place], 51 year old female who presents to [facility] ED voluntarily for treatment; however, IVC paperwork has been taken out due to her current status. Per triage note, Pt comes into the ED via POV c/o psychiatric evaluation.  Pt states "my thoughts are no longer my thoughts anymore".  Pt presents tearful in triage now. Pt also has been having a tough time with sleeping.  Pt in NAD with even and unlabored respirations. Pt presents slow movements and in responses during triage.  PT will randomly start crying, but unable to fully express what she is upset about. Son genuinely concerned in triage and responding appropriately to patient behavior.   During TTS assessment pt presents alert and oriented x 4, restless but cooperative, and mood-congruent with affect. The pt appears to be responding to internal stimuli. Pt is not presenting with any delusional thinking. Pt verified the information provided to triage RN.   Pt identifies her main complaint to be that she was not feeling like herself weird and asked her oldest son to bring her to the hospital. Patient was discharged from the inpatient psych unit yesterday and declined assistance with follow-up care. Patient reports she is not currently taking any medications and only slept for 2 hours last night. Patient minimizes her symptoms and is not forthcoming when asked questions about her mood. Patient did state that she was depressed, anxious and hearing voices; however, she feels better now and wants to be discharged. I just needed some rest. Patient denies using any illicit substances and alcohol.   Pt provided her son, Tonya Watkins as a collateral contact. Tonya Watkins states patient has been out of it for the past 4 months. He reports patient seems confused at times and will go in and out throughout the day. It happens in increments. She is ok and then a few minutes later she  talks about trying to save [place]. Tonya Watkins and his younger brother and sister all live in the home with patient. Patients son says patient has not been eating as much as she normally does and she rarely gets any sleep. He reports patient seeing and hearing things he does not. Tonya Watkins and his siblings are concerned about patient and wants to make sure she is safe. He reports that 4 out of 7 days, patient endorses these psychotic symptoms.       Per Tonya Share, NP, pt is recommended for inpatient psychiatric admission.    Chief Complaint:  Chief Complaint  Patient presents with   Psychiatric Evaluation   Visit Diagnosis: Acute psychosis    CCA Screening, Triage and Referral (STR)  Patient Reported Information How did you hear about [place]? Self  Referral name: No data recorded Referral phone number: No data recorded  Whom do you see for routine medical problems? No data recorded Practice/Facility Name: No data recorded Practice/Facility Phone Number: No data recorded Name of Contact: No data recorded Contact Number: No data recorded Contact Fax Number: No data recorded Prescriber Name: No data recorded Prescriber Address (if known): No data recorded  What Is the Reason for Your Visit/Call Today? Patient reports she came to the hospital voluntarily because "she was not feeling like herself."  How Long Has This Been Causing You Problems? 1-6 months  What Do You Feel Would Help You the Most Today? Medication(s)   Have You Recently Been in Any Inpatient Treatment (Hospital/Detox/Crisis Center/28-Day Program)? No data recorded Name/Location of Program/Hospital:No data recorded How  Long Were You There? No data recorded When Were You Discharged? No data recorded  Have You Ever Received Services From [facility] Before? No data recorded Who Do You See at [facility]? No data recorded  Have You Recently Had Any Thoughts About Hurting Yourself? No  Are You Planning to Commit Suicide/Harm  Yourself At This time? No   Have you Recently Had Thoughts About [place]? No  Explanation: No data recorded  Have You Used Any Alcohol or Drugs in the Past 24 Hours? No  How Long Ago Did You Use Drugs or Alcohol? No data recorded What Did You Use and How Much? No data recorded  Do You Currently Have a Therapist/Psychiatrist? No  Name of Therapist/Psychiatrist: No data recorded  Have You Been Recently Discharged From Any Office Practice or Programs? Yes  Explanation of Discharge From Practice/Program: [facility]     CCA Screening Triage Referral Assessment Type of Contact: Face-to-Face  Is this Initial or Reassessment? No data recorded Date Telepsych consult ordered in [facility]:  No data recorded Time Telepsych consult ordered in [facility]:  No data recorded  Patient Reported Information Reviewed? No data recorded Patient Left Without Being Seen? No data recorded Reason for Not Completing Assessment: No data recorded  Collateral Involvement: Oldest son- Tonya Watkins   Does Patient Have a Murphys? No data recorded Name and Contact of Legal Guardian: No data recorded If Minor and Not Living with Parent(s), Who has Custody? n/a  Is CPS involved or ever been involved? Never  Is APS involved or ever been involved? Never   Patient Determined To Be At Risk for Harm To Self or Others Based on Review of Patient Reported Information or Presenting Complaint? No  Method: No data recorded Availability of Means: No data recorded Intent: No data recorded Notification Required: No data recorded Additional Information for Danger to Others Potential: No data recorded Additional Comments for Danger to Others Potential: No data recorded Are There Guns or Other Weapons in Your Home? No data recorded Types of Guns/Weapons: No data recorded Are These Weapons Safely Secured?                            No data recorded Who Could Verify You Are Able To Have These  Secured: No data recorded Do You Have any Outstanding Charges, Pending Court Dates, Parole/Probation? No data recorded Contacted To Inform of Risk of Harm To Self or Others: No data recorded  Location of Assessment: [facility] ED   Does Patient Present under Involuntary Commitment? No  IVC Papers Initial File Date: No data recorded  [place] of Residence: [place]   Patient Currently Receiving the Following Services: Not Receiving Services   Determination of Need: Emergent (2 hours)   Options For Referral: Inpatient Hospitalization; ED Visit; Medication Management; Intensive Outpatient Therapy     CCA Biopsychosocial Intake/Chief Complaint:  No data recorded Current Symptoms/Problems: No data recorded  Patient Reported Schizophrenia/Schizoaffective Diagnosis in Past: No   Strengths: Patient is able to communicate and verbalize her needs.  Preferences: No data recorded Abilities: No data recorded  Type of Services Patient Feels are Needed: No data recorded  Initial Clinical Notes/Concerns: No data recorded  Mental Health Symptoms Depression:   Change in energy/activity; Fatigue; Sleep (too much or little)   Duration of Depressive symptoms:  Greater than two weeks   Mania:   None   Anxiety:    Sleep; Difficulty concentrating  Psychosis:   Hallucinations; Grossly disorganized or catatonic behavior   Duration of Psychotic symptoms:  Less than six months   Trauma:   None   Obsessions:   None   Compulsions:   Absent insight/delusional   Inattention:   Disorganized   Hyperactivity/Impulsivity:   None   Oppositional/Defiant Behaviors:   None   Emotional Irregularity:   Potentially harmful impulsivity   Other Mood/Personality Symptoms:  No data recorded   Mental Status Exam Appearance and self-care  Stature:   Average   Weight:   Average weight   Clothing:   Casual   Grooming:   Normal   Cosmetic use:   None   Posture/gait:    Normal   Motor activity:   Not Remarkable   Sensorium  Attention:   Confused; Inattentive   Concentration:   Preoccupied   Orientation:   X5   Recall/memory:   Normal   Affect and Mood  Affect:   Flat; Depressed; Anxious; Restricted   Mood:   Depressed; Anxious   Relating  Eye contact:   Staring   Facial expression:   Anxious; Depressed   Attitude toward examiner:   Guarded   Thought and Language  Speech flow:  Soft   Thought content:   Appropriate to Mood and Circumstances   Preoccupation:   None   Hallucinations:   Auditory   Organization:  No data recorded  [employer] of Knowledge:   Fair   Intelligence:   Average   Abstraction:   Normal   Judgement:   Fair   Programmer, systems   Insight:   Lacking   Decision Making:   Confused   Social Functioning  Social Maturity:   Responsible   Social Judgement:   Normal   Stress  Stressors:   Relationship   Coping Ability:   Contractor Deficits:   Communication   Supports:   Family     Religion:    Leisure/Recreation:    Exercise/Diet: Exercise/Diet Do You Have Any Trouble Sleeping?: Yes Explanation of Sleeping Difficulties: Patient reports she is not getting any rest.   CCA Employment/Education Employment/Work Situation:    Education:     CCA Family/Childhood History Family and Relationship History: Family history What types of issues is patient dealing with in the relationship?: Patient's fiance' broke up with her about a month ago. Does patient have children?: Yes How many children?: 3 How is patient's relationship with their children?: Patient has a good relationship with her children  Childhood History:     Child/Adolescent Assessment:     CCA Substance Use Alcohol/Drug Use: Alcohol / Drug Use Pain Medications: See MAR Prescriptions: See MAR Over the Counter: See MAR History of alcohol / drug use?: No  history of alcohol / drug abuse                         ASAM's:  Six Dimensions of Multidimensional Assessment  Dimension 1:  Acute Intoxication and/or Withdrawal Potential:      Dimension 2:  Biomedical Conditions and Complications:      Dimension 3:  Emotional, Behavioral, or Cognitive Conditions and Complications:     Dimension 4:  Readiness to Change:     Dimension 5:  Relapse, Continued use, or Continued Problem Potential:     Dimension 6:  Recovery/Living Environment:     ASAM Severity Score:    ASAM Recommended Level of Treatment:  Substance use Disorder (SUD)    Recommendations for Services/Supports/Treatments:    DSM5 Diagnoses: Patient Active Problem List   Diagnosis Date Noted   Acute psychosis ([place]) 06/02/2021   Well adult exam 06/30/2018   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   Tinnitus of right ear 08/15/2017   Middle ear effusion, right 08/15/2017   Fatigue due to depression 04/10/2016   CTS (carpal tunnel syndrome) 03/10/2013   Depression, reactive 03/10/2013   OSA (obstructive sleep apnea) 11/24/2012   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 [id number]   Obesity 10/02/2012   Eczema 03/09/2012   Axillary adenopathy 01/01/2012   Palpitations 01/01/2012   Panic attacks 11/20/2008   PANIC DISORDER 09/21/2008   DEPRESSION, SITUATIONAL, SEVERE 09/21/2008   SHOULDER PAIN 11/16/2007   SINUSITIS 10/13/2007   ALLERGIC RHINITIS 10/13/2007   FATIGUE 10/13/2007   ANEMIA, HX OF 10/13/2007    Patient Centered Plan: Patient is on the following Treatment Plan(s):  Anxiety and Depression   Referrals to Alternative Service(s): Referred to Alternative Service(s):   Place:   Date:   Time:    Referred to Alternative Service(s):   Place:   Date:   Time:    Referred to Alternative Service(s):   Place:   Date:   Time:    Referred to Alternative Service(s):   Place:   Date:   Time:     Robson Trickey Glennon Mac, Counselor, LCAS-A

## 2021-06-06 NOTE — ED Notes (Signed)
Pt's son, Stevanie Britts, can be reached at [phone].

## 2021-06-06 NOTE — ED Notes (Signed)
Food tray with drink was given. °
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
## 2021-06-06 NOTE — ED Notes (Addendum)
Personal Belongings:  Recruitment consultant Burgundy shoes Blue socks Black and green rimmed glasses (present with patient) Barista blue jacket Pink underwear

## 2021-06-06 NOTE — Consult Note (Addendum)
[facility] Face-to-Face Psychiatry Consult   Reason for Consult:  psychosis Referring Physician: Malinda Patient Identification: Annalya Watkins MRN:  [id number] Principal Diagnosis: Acute psychosis ([place]) Diagnosis:  Principal Problem:   Acute psychosis ([place])   Total Time spent with patient: 45 minutes  Subjective:  " I feel better now." Tonya Watkins is a 51 y.o. female patient admitted with psychosis.  HPI: Patient seen face to face and chart reviewed.  Patient was discharged from our inpatient facility yesterday.  She returned to the ED today out of concern for her disturbing thoughts.  Patient talks in a slow manner speech is slightly blocked.  Patient states that she was "not feeling myself at the time, but I do feel better now."  Patient continued to repeat that she "feels better now" with each question.  She is vague and appears to be minimizing her symptoms.  When asked why she came back to the hospital she just would reply "I feel better now."  Per chart review, patient was tearful in triage and stated that she was having thoughts that are not her thoughts.  Writer asked patient if she has done anything to harm herself, and patient would not reply to this.  Patient appears to be responding to internal stimuli.  She will admit only to some racing thoughts, but states it is because she did not sleep well last night.  Patient denies taking any medications, including phentermine. UDS positive for cannabinoids only.  She reports that her appetite was good last night.  Patient meets criteria for inpatient psychiatric hospitalization.   Collateral from the son Tonya Watkins, [phone]): He states that for about the past 4 months patient has been "out of it."  She will have periods where she will seem to be doing okay and then for a few hours she will be anxious and jittery, talking about spiritual things and souls.  Patient's son states that she is seeing beings and hearing things that he does not hear.  This  son, 2 years, lives with patient, along with 52 year old brother and 19 year old sister.  Patient had live-in boyfriend until about 1 month ago.  However, boyfriend and patient "broke up" before he moved out. [name] not sure if patient is taking any medication at this time, but he is very worried about his mother and her safety.  He has not known of her doing anything to harm herself but she will frequently say "I think I am going to die." He reports hat his mother has these psychotic symptoms 4 out of 7 days for the last 4 months.  Past Psychiatric History: hospitalized for psychosis Jan 21-24, 2023. Depression, treated with bupropion.   Risk to Self:   Risk to Others:   Prior Inpatient Therapy:   Prior Outpatient Therapy:    Past Medical History:  Past Medical History:  Diagnosis Date   DVT (deep venous thrombosis) (HCC)    left calf   Heart palpitations    Medical history non-contributory    History reviewed. No pertinent surgical history. Family History:  Family History  Problem Relation Age of Onset   Allergies Mother    Cancer Mother        thyroid   Family Psychiatric  History:  Social History:  Social History   Substance and Sexual Activity  Alcohol Use No     Social History   Substance and Sexual Activity  Drug Use No    Social History   Socioeconomic History   Marital status: Divorced  Spouse name: Not on file   Number of children: 3   Years of education: Not on file   Highest education level: Not on file  Occupational History   Occupation: RN  Tobacco Use   Smoking status: Never   Smokeless tobacco: Never  Vaping Use   Vaping Use: Unknown  Substance and Sexual Activity   Alcohol use: No   Drug use: No   Sexual activity: Yes  Other Topics Concern   Not on file  Social History Narrative   Not on file   Social Determinants of Health   Financial Resource Strain: Not on file  Food Insecurity: Not on file  Transportation Needs: Not on file   Physical Activity: Not on file  Stress: Not on file  Social Connections: Not on file   Additional Social History:    Allergies:  No Known Allergies  Labs:  Results for orders placed or performed during the hospital encounter of 06/06/21 (from the past 48 hour(s))  Comprehensive metabolic panel     Status: Abnormal   Collection Time: 06/06/21 11:50 AM  Result Value Ref Range   Sodium 141 135 - 145 mmol/L   Potassium 3.4 (L) 3.5 - 5.1 mmol/L   Chloride 106 98 - 111 mmol/L   CO2 27 22 - 32 mmol/L   Glucose, Bld 108 (H) 70 - 99 mg/dL    Comment: Glucose reference range applies only to samples taken after fasting for at least 8 hours.   BUN 8 6 - 20 mg/dL   Creatinine, Ser 0.75 0.44 - 1.00 mg/dL   Calcium 9.6 8.9 - 10.3 mg/dL   Total Protein 7.9 6.5 - 8.1 g/dL   Albumin 4.4 3.5 - 5.0 g/dL   AST 26 15 - 41 U/L   ALT 25 0 - 44 U/L   Alkaline Phosphatase 47 38 - 126 U/L   Total Bilirubin 0.6 0.3 - 1.2 mg/dL   GFR, Estimated >60 >60 mL/min    Comment: (NOTE) Calculated using the CKD-EPI Creatinine Equation (2021)    Anion gap 8 5 - 15    Comment: Performed at [facility], [address] [place], [place] [address]  Ethanol     Status: None   Collection Time: 06/06/21 11:50 AM  Result Value Ref Range   Alcohol, Ethyl (B) <10 <10 mg/dL    Comment: (NOTE) Lowest detectable limit for serum alcohol is 10 mg/dL.  For medical purposes only. Performed at [facility], [address] [place], [place] [id number]   Salicylate level     Status: Abnormal   Collection Time: 06/06/21 11:50 AM  Result Value Ref Range   Salicylate Lvl [id number] (L) 7.0 - 30.0 mg/dL    Comment: Performed at [facility], [address] [place], [place] [address]  Acetaminophen level     Status: Abnormal   Collection Time: 06/06/21 11:50 AM  Result Value Ref Range   Acetaminophen (Tylenol), Serum <10 (L) 10 - 30 ug/mL    Comment: (NOTE) Therapeutic concentrations vary  significantly. A range of 10-30 ug/mL  may be an effective concentration for many patients. However, some  are best treated at concentrations outside of this range. Acetaminophen concentrations >150 ug/mL at 4 hours after ingestion  and >50 ug/mL at 12 hours after ingestion are often associated with  toxic reactions.  Performed at [facility], [address], [place], [place] [address]   cbc     Status: None   Collection Time:  06/06/21 11:50 AM  Result Value Ref Range   WBC 5.4 4.0 - 10.5 K/uL   RBC 3.93 3.87 - 5.11 MIL/uL   Hemoglobin 12.3 12.0 - 15.0 g/dL   HCT 36.8 36.0 - 46.0 %   MCV 93.6 80.0 - 100.0 fL   MCH 31.3 26.0 - 34.0 pg   MCHC 33.4 30.0 - 36.0 g/dL   RDW 12.3 11.5 - 15.5 %   Platelets 356 150 - 400 K/uL   nRBC 0.0 0.0 - 0.2 %    Comment: Performed at [facility], [address], [place], [place] [address]  Urine Drug Screen, Qualitative     Status: Abnormal   Collection Time: 06/06/21 11:50 AM  Result Value Ref Range   Tricyclic, Ur Screen NONE DETECTED NONE DETECTED   Amphetamines, Ur Screen NONE DETECTED NONE DETECTED   MDMA (Ecstasy)Ur Screen NONE DETECTED NONE DETECTED   Cocaine Metabolite,Ur [place] NONE DETECTED NONE DETECTED   Opiate, Ur Screen NONE DETECTED NONE DETECTED   Phencyclidine (PCP) Ur S NONE DETECTED NONE DETECTED   Cannabinoid 50 Ng, Ur [place] POSITIVE (A) NONE DETECTED   Barbiturates, Ur Screen NONE DETECTED NONE DETECTED   Benzodiazepine, Ur Scrn NONE DETECTED NONE DETECTED   Methadone Scn, Ur NONE DETECTED NONE DETECTED    Comment: (NOTE) Tricyclics + metabolites, urine    Cutoff 1000 ng/mL Amphetamines + metabolites, urine  Cutoff 1000 ng/mL MDMA (Ecstasy), urine              Cutoff 500 ng/mL Cocaine Metabolite, urine          Cutoff 300 ng/mL Opiate + metabolites, urine        Cutoff 300 ng/mL Phencyclidine (PCP), urine         Cutoff 25 ng/mL Cannabinoid, urine                 Cutoff 50 ng/mL Barbiturates + metabolites,  urine  Cutoff 200 ng/mL Benzodiazepine, urine              Cutoff 200 ng/mL Methadone, urine                   Cutoff 300 ng/mL  The urine drug screen provides only a preliminary, unconfirmed analytical test result and should not be used for non-medical purposes. Clinical consideration and professional judgment should be applied to any positive drug screen result due to possible interfering substances. A more specific alternate chemical method must be used in order to obtain a confirmed analytical result. Gas chromatography / mass spectrometry (GC/MS) is the preferred confirm atory method. Performed at [facility], [address] [place],  [address]   Resp Panel by RT-PCR (Flu A&B, Covid) Nasopharyngeal Swab     Status: None   Collection Time: 06/06/21 11:50 AM   Specimen: Nasopharyngeal Swab; Nasopharyngeal(NP) swabs in vial transport medium  Result Value Ref Range   SARS Coronavirus 2 by RT PCR NEGATIVE NEGATIVE    Comment: (NOTE) SARS-CoV-2 target nucleic acids are NOT DETECTED.  The SARS-CoV-2 RNA is generally detectable in upper respiratory specimens during the acute phase of infection. The lowest concentration of SARS-CoV-2 viral copies this assay can detect is 138 copies/mL. A negative result does not preclude SARS-Cov-2 infection and should not be used as the sole basis for treatment or other patient management decisions. A negative result may occur with  improper specimen collection/handling, submission of specimen other than nasopharyngeal swab, presence of viral mutation(s) within the areas targeted by  this assay, and inadequate number of viral copies(<138 copies/mL). A negative result must be combined with clinical observations, patient history, and epidemiological information. The expected result is Negative.  Fact Sheet for Patients:  EntrepreneurPulse.com.au  Fact Sheet for Healthcare Providers:   IncredibleEmployment.be  This test is no t yet approved or cleared by the [place] FDA and  has been authorized for detection and/or diagnosis of SARS-CoV-2 by FDA under an Emergency Use Authorization (EUA). This EUA will remain  in effect (meaning this test can be used) for the duration of the COVID-19 declaration under Section 564(b)(1) of the Act, 21 U.S.C.section 360bbb-3(b)(1), unless the authorization is terminated  or revoked sooner.       Influenza A by PCR NEGATIVE NEGATIVE   Influenza B by PCR NEGATIVE NEGATIVE    Comment: (NOTE) The Xpert Xpress SARS-CoV-2/FLU/RSV plus assay is intended as an aid in the diagnosis of influenza from Nasopharyngeal swab specimens and should not be used as a sole basis for treatment. Nasal washings and aspirates are unacceptable for Xpert Xpress SARS-CoV-2/FLU/RSV testing.  Fact Sheet for Patients: EntrepreneurPulse.com.au  Fact Sheet for Healthcare Providers: IncredibleEmployment.be  This test is not yet approved or cleared by the [place] FDA and has been authorized for detection and/or diagnosis of SARS-CoV-2 by FDA under an Emergency Use Authorization (EUA). This EUA will remain in effect (meaning this test can be used) for the duration of the COVID-19 declaration under Section 564(b)(1) of the Act, 21 U.S.C. section 360bbb-3(b)(1), unless the authorization is terminated or revoked.  Performed at [facility], [address] [place], [place] [address]   Pregnancy, urine     Status: None   Collection Time: 06/06/21 11:58 AM  Result Value Ref Range   Preg Test, Ur NEGATIVE NEGATIVE    Comment: Performed at [facility], [address] [place], [place] [address]    No current facility-administered medications for this encounter.   Current Outpatient Medications  Medication Sig Dispense Refill   buPROPion (WELLBUTRIN XL) 300 MG 24 hr tablet  Take 1 tablet by mouth once daily 30 tablet 5    Musculoskeletal: Strength & Muscle Tone: within normal limits Gait & Station: normal Patient leans: N/A            Psychiatric Specialty Exam:  Presentation  General Appearance: Appropriate for Environment  Eye Contact:Fair  Speech:Blocked  Speech Volume:Normal  Handedness:No data recorded  Mood and Affect  Mood:Dysphoric  Affect:Blunt   Thought Process  Thought Processes:Disorganized  Descriptions of Associations:Loose  Orientation:Full (Time, Place and Person)  Thought Content:Illusions  History of Schizophrenia/Schizoaffective disorder:No  Duration of Psychotic Symptoms:Less than six months  Hallucinations:Hallucinations: Auditory; Visual  Ideas of Reference:-- (unable to assess)  Suicidal Thoughts:Suicidal Thoughts: -- (UTA)  Homicidal Thoughts:Homicidal Thoughts: No (UTA)   Sensorium  Memory:Immediate Poor  Judgment:Poor  Insight:Poor   Executive Functions  Concentration:Poor  Attention Span:Poor  [place]   Psychomotor Activity  Psychomotor Activity:Psychomotor Activity: Decreased  Assets  Assets:Desire for Improvement; Financial Resources/Insurance; Housing; Resilience; Social Support; Physical Health   Sleep  Sleep:Sleep: Poor  Physical Exam: Physical Exam Vitals and nursing note reviewed.  HENT:     Head: Normocephalic.     Nose: No congestion or rhinorrhea.  Eyes:     General:        Right eye: No discharge.        Left eye: No discharge.  Cardiovascular:     Rate and Rhythm:  Normal rate.  Pulmonary:     Effort: Pulmonary effort is normal.  Musculoskeletal:        General: Normal range of motion.     Cervical back: Normal range of motion.  Skin:    General: Skin is dry.  Neurological:     Mental Status: She is alert and oriented to person, place, and time.  Psychiatric:        Attention and Perception:  Attention normal.        Mood and Affect: Affect is blunt.        Speech: Speech is delayed.        Behavior: Behavior is withdrawn.     Comments: Psychosis   Review of Systems  Psychiatric/Behavioral:  Positive for hallucinations.   All other systems reviewed and are negative. Blood pressure (!) 136/97, pulse 91, temperature 99.1 F (37.3 C), temperature source Oral, resp. rate 16, height 5\' 3"  (1.6 m), weight 68 kg, SpO2 99 %. Body mass index is 26.57 kg/m.  Treatment Plan Summary: Plan Admit  for stabilization and treatment  Disposition: Recommend psychiatric Inpatient admission when medically cleared.  Sherlon Handing, NP 06/06/2021 3:33 PM

## 2021-06-06 NOTE — ED Notes (Addendum)
[phone], mother Illene Labrador

## 2021-06-06 NOTE — ED Triage Notes (Addendum)
Pt comes into the ED via POV c/o psychiatric evaluation.  Pt states "my thoughts are no longer my thoughts anymore".  Pt presents tearful in triage at this time.  Pt also has been having a difficult time with sleeping.  Pt in NAD at this time with even and unlabored respirations. Pt presents slow in movements and in responses during triage.  PT will randomly start crying,  but unable to fully express what she is upset about. Son very concerned in triage and responding appropriately to patient behavior.

## 2021-06-07 ENCOUNTER — Other Ambulatory Visit: Payer: Self-pay

## 2021-06-07 ENCOUNTER — Encounter: Payer: Self-pay | Admitting: Psychiatry

## 2021-06-07 ENCOUNTER — Inpatient Hospital Stay
Admission: AD | Admit: 2021-06-07 | Discharge: 2021-06-19 | DRG: 885 | Disposition: A | Discharge status: Home / Self Care | Payer: 59 | Source: Intra-hospital | Attending: Psychiatry | Admitting: Psychiatry

## 2021-06-07 DIAGNOSIS — F323 Major depressive disorder, single episode, severe with psychotic features: Secondary | ICD-10-CM | POA: Diagnosis present

## 2021-06-07 DIAGNOSIS — Z86718 Personal history of other venous thrombosis and embolism: Secondary | ICD-10-CM | POA: Diagnosis not present

## 2021-06-07 DIAGNOSIS — G4700 Insomnia, unspecified: Secondary | ICD-10-CM | POA: Diagnosis present

## 2021-06-07 DIAGNOSIS — K5900 Constipation, unspecified: Secondary | ICD-10-CM | POA: Diagnosis present

## 2021-06-07 DIAGNOSIS — I493 Ventricular premature depolarization: Secondary | ICD-10-CM | POA: Diagnosis present

## 2021-06-07 DIAGNOSIS — R079 Chest pain, unspecified: Secondary | ICD-10-CM | POA: Diagnosis present

## 2021-06-07 DIAGNOSIS — F23 Brief psychotic disorder: Secondary | ICD-10-CM | POA: Diagnosis not present

## 2021-06-07 DIAGNOSIS — R002 Palpitations: Secondary | ICD-10-CM | POA: Diagnosis present

## 2021-06-07 DIAGNOSIS — Z79899 Other long term (current) drug therapy: Secondary | ICD-10-CM | POA: Diagnosis not present

## 2021-06-07 DIAGNOSIS — Z20822 Contact with and (suspected) exposure to covid-19: Secondary | ICD-10-CM | POA: Diagnosis present

## 2021-06-07 MED ORDER — HYDROXYZINE HCL 50 MG PO TABS
50.0000 mg | ORAL_TABLET | Freq: Three times a day (TID) | ORAL | Status: DC | PRN
  Administered 2021-06-09 – 2021-06-13 (×3): 50 mg via ORAL
  Filled 2021-06-07 (×3): qty 1

## 2021-06-07 MED ORDER — BENZTROPINE MESYLATE 1 MG PO TABS
0.5000 mg | ORAL_TABLET | Freq: Every day | ORAL | Status: DC
  Administered 2021-06-13 – 2021-06-18 (×6): 0.5 mg via ORAL
  Filled 2021-06-07 (×8): qty 1

## 2021-06-07 MED ORDER — RISPERIDONE 1 MG PO TBDP
2.0000 mg | ORAL_TABLET | Freq: Every day | ORAL | Status: DC
  Administered 2021-06-07 – 2021-06-09 (×3): 2 mg via ORAL
  Filled 2021-06-07 (×5): qty 2

## 2021-06-07 MED ORDER — RISPERIDONE 1 MG PO TABS: 1.0000 mg | ORAL_TABLET | Freq: Every day | ORAL | Status: DC

## 2021-06-07 MED ORDER — MAGNESIUM HYDROXIDE 400 MG/5ML PO SUSP: 30.0000 mL | Freq: Every day | ORAL | Status: DC | PRN

## 2021-06-07 MED ORDER — ACETAMINOPHEN 325 MG PO TABS: 650.0000 mg | ORAL_TABLET | Freq: Four times a day (QID) | ORAL | Status: DC | PRN

## 2021-06-07 MED ORDER — ALUM & MAG HYDROXIDE-SIMETH 200-200-20 MG/5ML PO SUSP: 30.0000 mL | ORAL | Status: DC | PRN

## 2021-06-07 NOTE — Progress Notes (Signed)
Patient ID: Tonya Watkins, female   DOB: [birth year], 51 y.o.   MRN: [id number]  Admission note: Patient is a 51 year old female, presents IVC per report of brief reactive psychosis. Patient is cooperative during assessment questions but was contradicting herself with some answers.  Patient identifies insomnia related to work and children as stressors related to admission.  Patient motor activity is slow. Patient presents with moderate confusion. Patient currently denies SI/HI/AVH. Unit policies explained and verbalized understanding. Q15 minute checks maintained and will continue to monitor.

## 2021-06-07 NOTE — Group Note (Signed)
[facility] LCSW Group Therapy Note ° ° °Group Date: 06/07/2021 °Start Time: 1300 °End Time: 1400 ° ° °Type of Therapy/Topic:  Group Therapy:  Balance in Life ° °Participation Level:  Did Not Attend  ° °Description of Group:   ° This group will address the concept of balance and how it feels and looks when one is unbalanced. Patients will be encouraged to process areas in their lives that are out of balance, and identify reasons for remaining unbalanced. Facilitators will guide patients utilizing problem- solving interventions to address and correct the stressor making their life unbalanced. Understanding and applying boundaries will be explored and addressed for obtaining  and maintaining a balanced life. Patients will be encouraged to explore ways to assertively make their unbalanced needs known to significant others in their lives, using other group members and facilitator for support and feedback. ° °Therapeutic Goals: °Patient will identify two or more emotions or situations they have that consume much of in their lives. °Patient will identify signs/triggers that life has become out of balance:  °Patient will identify two ways to set boundaries in order to achieve balance in their lives:  °Patient will demonstrate ability to communicate their needs through discussion and/or role plays ° °Summary of Patient Progress: ° ° ° °X ° ° ° °Therapeutic Modalities:   °Cognitive Behavioral Therapy °Solution-Focused Therapy °Assertiveness Training ° ° °Vannesa Abair J Kynedi Profitt, LCSW °

## 2021-06-07 NOTE — [facility] Suicide Risk Assessment (Signed)
[facility] Admission Suicide Risk Assessment   Nursing information obtained from:  Patient Demographic factors:  NA Current Mental Status:  NA Loss Factors:  Loss of significant relationship Historical Factors:  NA Risk Reduction Factors:  Sense of responsibility to family, Employed, Positive social support  Total Time spent with patient: 1 hour Principal Problem: Brief reactive psychosis ([place]) Diagnosis:  Principal Problem:   Brief reactive psychosis ([place])  Subjective Data: Patient seen and chart reviewed.  51 year old woman who was just discharged from the hospital 2 days previously.  Patient came back to the hospital after a single night with complaints that "her thoughts were not her own".  No indication of self-harm.  Denies suicidal ideation.  Continued Clinical Symptoms:  Alcohol Use Disorder Identification Test Final Score (AUDIT): 0 The "Alcohol Use Disorders Identification Test", Guidelines for Use in Primary Care, Second Edition.  World Pharmacologist [facility]). Score between 0-7:  no or low risk or alcohol related problems. Score between 8-15:  moderate risk of alcohol related problems. Score between 16-19:  high risk of alcohol related problems. Score 20 or above:  warrants further diagnostic evaluation for alcohol dependence and treatment.   CLINICAL FACTORS:   Currently Psychotic   Musculoskeletal: Strength & Muscle Tone: within normal limits Gait & Station: normal Patient leans: N/A  Psychiatric Specialty Exam:  Presentation  General Appearance: Appropriate for Environment  Eye Contact:Fair  Speech:Blocked  Speech Volume:Normal  Handedness:No data recorded  Mood and Affect  Mood:Dysphoric  Affect:Blunt   Thought Process  Thought Processes:Disorganized  Descriptions of Associations:Loose  Orientation:Full (Time, Place and Person)  Thought Content:Illusions  History of Schizophrenia/Schizoaffective disorder:No  Duration of Psychotic  Symptoms:Less than six months  Hallucinations:Hallucinations: Auditory; Visual  Ideas of Reference:-- (unable to assess)  Suicidal Thoughts:Suicidal Thoughts: -- (UTA)  Homicidal Thoughts:Homicidal Thoughts: No (UTA)   Sensorium  Memory:Immediate Poor  Judgment:Poor  Insight:Poor   Executive Functions  Concentration:Poor  Attention Span:Poor  [place]   Psychomotor Activity  Psychomotor Activity:Psychomotor Activity: Decreased   Assets  Assets:Desire for Improvement; Financial Resources/Insurance; Housing; Resilience; Social Support; Physical Health   Sleep  Sleep:Sleep: Poor    Physical Exam: Physical Exam Constitutional:      Appearance: Normal appearance.  HENT:     Head: Normocephalic and atraumatic.     Mouth/Throat:     Pharynx: Oropharynx is clear.  Eyes:     Pupils: Pupils are equal, round, and reactive to light.  Cardiovascular:     Rate and Rhythm: Normal rate and regular rhythm.  Pulmonary:     Effort: Pulmonary effort is normal.     Breath sounds: Normal breath sounds.  Abdominal:     General: Abdomen is flat.     Palpations: Abdomen is soft.  Musculoskeletal:        General: Normal range of motion.  Skin:    General: Skin is warm and dry.  Neurological:     General: No focal deficit present.     Mental Status: She is alert. Mental status is at baseline.  Psychiatric:        Attention and Perception: She is inattentive.        Mood and Affect: Mood normal. Affect is blunt.        Speech: She is noncommunicative. Speech is delayed.        Behavior: Behavior is slowed.        Thought Content: Thought content is paranoid.  Cognition and Memory: Memory is impaired.        Judgment: Judgment is inappropriate.   Review of Systems  Constitutional: Negative.   HENT: Negative.    Eyes: Negative.   Respiratory: Negative.    Cardiovascular: Negative.   Gastrointestinal: Negative.    Musculoskeletal: Negative.   Skin: Negative.   Neurological: Negative.   Psychiatric/Behavioral:  The patient has insomnia.   Blood pressure 138/89, pulse 79, temperature 98.7 F (37.1 C), temperature source Oral, resp. rate 18, height 5\' 3"  (1.6 m), weight 68 kg, SpO2 100 %. Body mass index is 26.56 kg/m.   COGNITIVE FEATURES THAT CONTRIBUTE TO RISK:  Thought constriction (tunnel vision)    SUICIDE RISK:   Minimal: No identifiable suicidal ideation.  Patients presenting with no risk factors but with morbid ruminations; may be classified as minimal risk based on the severity of the depressive symptoms  PLAN OF CARE: Patient appears to be having a psychotic episode and will be treated with medication as appropriate.  Continue 15-minute checks.  Engage in individual and group therapy and treatment team meeting.  Monitor dangerousness prior to discharge  I certify that inpatient services furnished can reasonably be expected to improve the patient's condition.   Alethia Berthold, MD 06/07/2021, 5:52 PM

## 2021-06-07 NOTE — ED Notes (Signed)
Report to Kaelyn, RN °

## 2021-06-07 NOTE — H&P (Signed)
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  51 year old woman with a history of past depression who was just discharged from the hospital 2 days previously.  She came back to the hospital after a single night with a complaint of "my thoughts are not my own".  Notes from the emergency room indicate patient appeared to be confused and disorganized in her thinking and that family was interviewed and reported patient had been having hallucinations confusion and strange behavior and poor self-care for several months.  On interview today the patient refuses to acknowledge any of the information in the history.  Refuses to acknowledge having had hallucinations despite the testimony of her son.  Refuses to acknowledge the chief complaint she presented with.  Patient will only say that she thinks the problem is that she is not sleeping well.  Very blunt and withdrawn. Associated Signs/Symptoms: Depression Symptoms:  insomnia, psychomotor retardation, fatigue, difficulty concentrating, Duration of Depression Symptoms: Greater than two weeks  (Hypo) Manic Symptoms:   None known Anxiety Symptoms:  Excessive Worry, Psychotic Symptoms:  Hallucinations: Auditory Paranoia, PTSD Symptoms: Unknown.  Recently had a major break-up in her life but as far as trauma history no details Total Time spent with patient: 1 hour  Past Psychiatric History: Patient has been treated for depression with bupropion by outpatient physician for years.  Has had no previous hospitalizations no history of mania or psychosis.  Recently broke up with her long-term boyfriend and ever since then has decompensated.  Only  hospitalization was the 1 that occurred last week.  No history of suicide attempts.  Is the patient at risk to self? Yes.    Has the patient been a risk to self in the past 6 months? Yes.    Has the patient been a risk to self within the distant past? No.  Is the patient a risk to others? No.  Has the patient been a risk to others in the past 6 months? No.  Has the patient been a risk to others within the distant past? No.   Prior Inpatient Therapy:   Prior Outpatient Therapy:    Alcohol Screening: Patient refused Alcohol Screening Tool: Yes 1. How often do you have a drink containing alcohol?: Never 2. How many drinks containing alcohol do you have on a typical day when you are drinking?: 1 or 2 3. How often do you have six or more drinks on one occasion?: Never AUDIT-C Score: 0 4. How often during the last year have you found that you were not able to stop drinking once you had started?: Never 5. How often during the last year have you failed to do what was normally expected from you because of drinking?: Never 6. How often during the last year have you needed a first drink in the morning to get yourself going after a heavy drinking session?: Never 7. How often during the last year have you had a feeling of guilt of remorse after drinking?: Never 8. How often during the last year have you been unable to remember what happened the night before because you had been drinking?: Never 9. Have you or someone else been injured as a result of your drinking?: No 10. Has a relative or friend  or a doctor or another health worker been concerned about your drinking or suggested you cut down?: No Alcohol Use Disorder Identification Test Final Score (AUDIT): 0 Substance Abuse History in the last 12 months:  No. Consequences of Substance Abuse: Negative Previous Psychotropic Medications: Yes  Psychological Evaluations: Yes  Past Medical History:  Past Medical History:  Diagnosis Date   DVT (deep  venous thrombosis) (HCC)    left calf   Heart palpitations    Medical history non-contributory    History reviewed. No pertinent surgical history. Family History:  Family History  Problem Relation Age of Onset   Allergies Mother    Cancer Mother        thyroid   Family Psychiatric  History: None reported Tobacco Screening:   Social History:  Social History   Substance and Sexual Activity  Alcohol Use No     Social History   Substance and Sexual Activity  Drug Use No    Additional Social History:                           Allergies:  No Known Allergies Lab Results:  Results for orders placed or performed during the hospital encounter of 06/06/21 (from the past 48 hour(s))  Comprehensive metabolic panel     Status: Abnormal   Collection Time: 06/06/21 11:50 AM  Result Value Ref Range   Sodium 141 135 - 145 mmol/L   Potassium 3.4 (L) 3.5 - 5.1 mmol/L   Chloride 106 98 - 111 mmol/L   CO2 27 22 - 32 mmol/L   Glucose, Bld 108 (H) 70 - 99 mg/dL    Comment: Glucose reference range applies only to samples taken after fasting for at least 8 hours.   BUN 8 6 - 20 mg/dL   Creatinine, Ser 0.75 0.44 - 1.00 mg/dL   Calcium 9.6 8.9 - 10.3 mg/dL   Total Protein 7.9 6.5 - 8.1 g/dL   Albumin 4.4 3.5 - 5.0 g/dL   AST 26 15 - 41 U/L   ALT 25 0 - 44 U/L   Alkaline Phosphatase 47 38 - 126 U/L   Total Bilirubin 0.6 0.3 - 1.2 mg/dL   GFR, Estimated >60 >60 mL/min    Comment: (NOTE) Calculated using the CKD-EPI Creatinine Equation (2021)    Anion gap 8 5 - 15    Comment: Performed at [facility], [address] [place], [place] [address]  Ethanol     Status: None   Collection Time: 06/06/21 11:50 AM  Result Value Ref Range   Alcohol, Ethyl (B) <10 <10 mg/dL    Comment: (NOTE) Lowest detectable limit for serum alcohol is 10 mg/dL.  For medical purposes only. Performed at [facility], [address] [place], [place] [id number]   Salicylate level      Status: Abnormal   Collection Time: 06/06/21 11:50 AM  Result Value Ref Range   Salicylate Lvl [id number] (L) 7.0 - 30.0 mg/dL    Comment: Performed at [facility], [address] [place], [place] [address]  Acetaminophen level     Status: Abnormal   Collection Time: 06/06/21 11:50 AM  Result Value Ref Range   Acetaminophen (Tylenol), Serum <10 (L) 10 - 30 ug/mL    Comment: (NOTE) Therapeutic concentrations vary significantly. A range of 10-30 ug/mL  may be an effective concentration for many patients. However, some  are best treated at concentrations outside of this range.  Acetaminophen concentrations >150 ug/mL at 4 hours after ingestion  and >50 ug/mL at 12 hours after ingestion are often associated with  toxic reactions.  Performed at [facility], [address] [place], [place] [address]   cbc     Status: None   Collection Time: 06/06/21 11:50 AM  Result Value Ref Range   WBC 5.4 4.0 - 10.5 K/uL   RBC 3.93 3.87 - 5.11 MIL/uL   Hemoglobin 12.3 12.0 - 15.0 g/dL   HCT 36.8 36.0 - 46.0 %   MCV 93.6 80.0 - 100.0 fL   MCH 31.3 26.0 - 34.0 pg   MCHC 33.4 30.0 - 36.0 g/dL   RDW 12.3 11.5 - 15.5 %   Platelets 356 150 - 400 K/uL   nRBC 0.0 0.0 - 0.2 %    Comment: Performed at [facility], [address], [place], [place] [address]  Urine Drug Screen, Qualitative     Status: Abnormal   Collection Time: 06/06/21 11:50 AM  Result Value Ref Range   Tricyclic, Ur Screen NONE DETECTED NONE DETECTED   Amphetamines, Ur Screen NONE DETECTED NONE DETECTED   MDMA (Ecstasy)Ur Screen NONE DETECTED NONE DETECTED   Cocaine Metabolite,Ur [place] NONE DETECTED NONE DETECTED   Opiate, Ur Screen NONE DETECTED NONE DETECTED   Phencyclidine (PCP) Ur S NONE DETECTED NONE DETECTED   Cannabinoid 50 Ng, Ur  POSITIVE (A) NONE DETECTED   Barbiturates, Ur Screen NONE DETECTED NONE DETECTED   Benzodiazepine, Ur Scrn NONE DETECTED NONE DETECTED   Methadone Scn, Ur NONE  DETECTED NONE DETECTED    Comment: (NOTE) Tricyclics + metabolites, urine    Cutoff 1000 ng/mL Amphetamines + metabolites, urine  Cutoff 1000 ng/mL MDMA (Ecstasy), urine              Cutoff 500 ng/mL Cocaine Metabolite, urine          Cutoff 300 ng/mL Opiate + metabolites, urine        Cutoff 300 ng/mL Phencyclidine (PCP), urine         Cutoff 25 ng/mL Cannabinoid, urine                 Cutoff 50 ng/mL Barbiturates + metabolites, urine  Cutoff 200 ng/mL Benzodiazepine, urine              Cutoff 200 ng/mL Methadone, urine                   Cutoff 300 ng/mL  The urine drug screen provides only a preliminary, unconfirmed analytical test result and should not be used for non-medical purposes. Clinical consideration and professional judgment should be applied to any positive drug screen result due to possible interfering substances. A more specific alternate chemical method must be used in order to obtain a confirmed analytical result. Gas chromatography / mass spectrometry (GC/MS) is the preferred confirm atory method. Performed at [facility], [address] [place], [place] [address]   Resp Panel by RT-PCR (Flu A&B, Covid) Nasopharyngeal Swab     Status: None   Collection Time: 06/06/21 11:50 AM   Specimen: Nasopharyngeal Swab; Nasopharyngeal(NP) swabs in vial transport medium  Result Value Ref Range   SARS Coronavirus 2 by RT PCR NEGATIVE NEGATIVE    Comment: (NOTE) SARS-CoV-2 target nucleic acids are NOT DETECTED.  The SARS-CoV-2 RNA is generally detectable in upper respiratory specimens during the acute phase of infection. The lowest concentration of SARS-CoV-2 viral copies this assay can detect is 138  copies/mL. A negative result does not preclude SARS-Cov-2 infection and should not be used as the sole basis for treatment or other patient management decisions. A negative result may occur with  improper specimen collection/handling, submission of specimen  other than nasopharyngeal swab, presence of viral mutation(s) within the areas targeted by this assay, and inadequate number of viral copies(<138 copies/mL). A negative result must be combined with clinical observations, patient history, and epidemiological information. The expected result is Negative.  Fact Sheet for Patients:  EntrepreneurPulse.com.au  Fact Sheet for Healthcare Providers:  IncredibleEmployment.be  This test is no t yet approved or cleared by the [place] FDA and  has been authorized for detection and/or diagnosis of SARS-CoV-2 by FDA under an Emergency Use Authorization (EUA). This EUA will remain  in effect (meaning this test can be used) for the duration of the COVID-19 declaration under Section 564(b)(1) of the Act, 21 U.S.C.section 360bbb-3(b)(1), unless the authorization is terminated  or revoked sooner.       Influenza A by PCR NEGATIVE NEGATIVE   Influenza B by PCR NEGATIVE NEGATIVE    Comment: (NOTE) The Xpert Xpress SARS-CoV-2/FLU/RSV plus assay is intended as an aid in the diagnosis of influenza from Nasopharyngeal swab specimens and should not be used as a sole basis for treatment. Nasal washings and aspirates are unacceptable for Xpert Xpress SARS-CoV-2/FLU/RSV testing.  Fact Sheet for Patients: EntrepreneurPulse.com.au  Fact Sheet for Healthcare Providers: IncredibleEmployment.be  This test is not yet approved or cleared by the [place] FDA and has been authorized for detection and/or diagnosis of SARS-CoV-2 by FDA under an Emergency Use Authorization (EUA). This EUA will remain in effect (meaning this test can be used) for the duration of the COVID-19 declaration under Section 564(b)(1) of the Act, 21 U.S.C. section 360bbb-3(b)(1), unless the authorization is terminated or revoked.  Performed at [facility], [address] [place], [place]  [address]   Pregnancy, urine     Status: None   Collection Time: 06/06/21 11:58 AM  Result Value Ref Range   Preg Test, Ur NEGATIVE NEGATIVE    Comment: Performed at [facility], [address] [place], [place] [address]    Blood Alcohol level:  Lab Results  Component Value Date   [facility] <10 [id number]    Metabolic Disorder Labs:  Lab Results  Component Value Date   HGBA1C 5.5 06/04/2021   MPG 111 06/04/2021   No results found for: PROLACTIN Lab Results  Component Value Date   CHOL 148 06/04/2021   TRIG 31 06/04/2021   HDL 71 06/04/2021   CHOLHDL 2.1 06/04/2021   VLDL 6 06/04/2021   LDLCALC 71 06/04/2021   LDLCALC 76 05/24/2021    Current Medications: Current Facility-Administered Medications  Medication Dose Route Frequency Provider Last Rate Last Admin   acetaminophen (TYLENOL) tablet 650 mg  650 mg Oral Q6H PRN Van Seymore, Madie Reno, MD       alum & mag hydroxide-simeth (MAALOX/MYLANTA) 200-200-20 MG/5ML suspension 30 mL  30 mL Oral Q4H PRN Alquan Morrish T, MD       benztropine (COGENTIN) tablet 0.5 mg  0.5 mg Oral QHS Brittany Amirault T, MD       hydrOXYzine (ATARAX) tablet 50 mg  50 mg Oral TID PRN Rever Pichette, Madie Reno, MD       magnesium hydroxide (MILK OF MAGNESIA) suspension 30 mL  30 mL Oral Daily PRN Radhika Dershem, Madie Reno, MD       risperiDONE (RISPERDAL M-TABS) disintegrating tablet  2 mg  2 mg Oral QHS Quita Mcgrory T, MD       PTA Medications: Medications Prior to Admission  Medication Sig Dispense Refill Last Dose   buPROPion (WELLBUTRIN XL) 300 MG 24 hr tablet Take 1 tablet by mouth once daily 30 tablet 5     Musculoskeletal: Strength & Muscle Tone: within normal limits Gait & Station: normal Patient leans: N/A            Psychiatric Specialty Exam:  Presentation  General Appearance: Appropriate for Environment  Eye Contact:Fair  Speech:Blocked  Speech Volume:Normal  Handedness:No data recorded  Mood and Affect   Mood:Dysphoric  Affect:Blunt   Thought Process  Thought Processes:Disorganized  Duration of Psychotic Symptoms: Less than six months  Past Diagnosis of Schizophrenia or Psychoactive disorder: No  Descriptions of Associations:Loose  Orientation:Full (Time, Place and Person)  Thought Content:Illusions  Hallucinations:Hallucinations: Auditory; Visual  Ideas of Reference:-- (unable to assess)  Suicidal Thoughts:Suicidal Thoughts: -- (UTA)  Homicidal Thoughts:Homicidal Thoughts: No (UTA)   Sensorium  Memory:Immediate Poor  Judgment:Poor  Insight:Poor   Executive Functions  Concentration:Poor  Attention Span:Poor  [place]   Psychomotor Activity  Psychomotor Activity:Psychomotor Activity: Decreased   Assets  Assets:Desire for Improvement; Financial Resources/Insurance; Housing; Resilience; Social Support; Physical Health   Sleep  Sleep:Sleep: Poor    Physical Exam: Physical Exam Vitals and nursing note reviewed.  Constitutional:      Appearance: Normal appearance.  HENT:     Head: Normocephalic and atraumatic.     Mouth/Throat:     Pharynx: Oropharynx is clear.  Eyes:     Pupils: Pupils are equal, round, and reactive to light.  Cardiovascular:     Rate and Rhythm: Normal rate and regular rhythm.  Pulmonary:     Effort: Pulmonary effort is normal.     Breath sounds: Normal breath sounds.  Abdominal:     General: Abdomen is flat.     Palpations: Abdomen is soft.  Musculoskeletal:        General: Normal range of motion.  Skin:    General: Skin is warm and dry.  Neurological:     General: No focal deficit present.     Mental Status: She is alert. Mental status is at baseline.  Psychiatric:        Attention and Perception: She is inattentive.        Mood and Affect: Mood normal. Affect is blunt.        Speech: Speech is delayed.        Behavior: Behavior is slowed.        Thought Content:  Thought content is paranoid.        Cognition and Memory: Cognition is impaired.        Judgment: Judgment is inappropriate.   Review of Systems  Constitutional: Negative.   HENT: Negative.    Eyes: Negative.   Respiratory: Negative.    Cardiovascular: Negative.   Gastrointestinal: Negative.   Musculoskeletal: Negative.   Skin: Negative.   Neurological: Negative.   Psychiatric/Behavioral:  The patient has insomnia.   Blood pressure 138/89, pulse 79, temperature 98.7 F (37.1 C), temperature source Oral, resp. rate 18, height 5\' 3"  (1.6 m), weight 68 kg, SpO2 100 %. Body mass index is 26.56 kg/m.  Treatment Plan Summary: Medication management and Plan 51 year old woman who is currently minimizing obvious symptom burden.  Her behavior on the ward has been withdrawn and peculiar at times.  Spends long  times pacing in her room or in her bathroom not engaged in any particular activity.  On interview makes only passing eye contact sits perfectly still answers questions in single words and denies symptoms.  Clearly continues to function well after most recent discharge.  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  Explained to patient that the treatment will include Risperdal 2 mg at night along with 1/2 mg of Cogentin.  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[place]) Long Term Goal(s): Improvement in symptoms so as ready for discharge  Short Term Goals: Ability to verbalize feelings will improve and Ability to identify and develop effective coping behaviors will improve  Physician Treatment Plan for Secondary Diagnosis: Principal Problem:   Brief  reactive psychosis ([place])  Long Term Goal(s): Improvement in symptoms so as ready for discharge  Short Term Goals: Compliance with prescribed medications will improve and Ability to identify triggers associated with substance abuse/mental health issues will improve  I certify that inpatient services furnished can reasonably be expected to improve the patient's condition.    Alethia Berthold, MD 1/26/20235:54 PM

## 2021-06-07 NOTE — ED Notes (Signed)
Hospital meal provided.  100% consumed, pt tolerated w/o complaints.  Waste discarded appropriately.   

## 2021-06-07 NOTE — Tx Team (Signed)
Initial Treatment Plan 06/07/2021 2:24 PM Tonya Watkins [id number]    PATIENT STRESSORS: Marital or family conflict     PATIENT STRENGTHS: Supportive family/friends    PATIENT IDENTIFIED PROBLEMS: insomnia                     DISCHARGE CRITERIA:  Improved stabilization in mood, thinking, and/or behavior  PRELIMINARY DISCHARGE PLAN: Return to previous living arrangement  PATIENT/FAMILY INVOLVEMENT: This treatment plan has been presented to and reviewed with the patient, Tonya Watkins.The patient and family have been given the opportunity to ask questions and make suggestions.  Donette Larry, RN 06/07/2021, 2:24 PM

## 2021-06-07 NOTE — ED Notes (Signed)
Yelling out from room, states she is hearing voices and having bad dreams. Appears very anxious

## 2021-06-08 NOTE — Progress Notes (Signed)
Recreation Therapy Notes  INPATIENT RECREATION THERAPY ASSESSMENT  Patient Details Name: Tonya Watkins MRN: [id number] DOB: 06/20/1970 Today's Date: 06/08/2021       Information Obtained From: Patient  Able to Participate in Assessment/Interview: Yes  Patient Presentation: Responsive  Reason for Admission (Per Patient): Active Symptoms  Patient Stressors:    Coping Skills:   Talk, Prayer  Leisure Interests (2+):  Individual - Reading, Social - Family, Social - Friends Holiday representative, biking)  Frequency of Recreation/Participation: Chief Executive Officer of Community Resources:  Yes  Community Resources:  [employer]  Current Use: Yes  If no, Barriers?:    Expressed Interest in [employer] Information: Yes  [place]  Patient Main Form of Transportation: Set designer  Patient Strengths:  Cooking  Patient Identified Areas of Improvement:  Getting better  Patient Goal for Hospitalization:  Feeling better  Current SI (including self-harm):  No  Current HI:  No  Current AVH: No  Staff Intervention Plan: Group Attendance, Collaborate with Interdisciplinary Treatment Team  Consent to Intern Participation: N/A  Romey Mathieson 06/08/2021, 3:53 PM

## 2021-06-08 NOTE — Progress Notes (Signed)
D: Pt alert and oriented. Pt denies experiencing any anxiety/depression at this time. Pt denies experiencing any pain at this time. Pt denies experiencing any SI/HI, or AVH at this time.   Pt has been calm and cooperative. Pt denies all however appears to be responding to internal stimuli at times.   A: Scheduled medications administered to pt, per MD orders. Support and encouragement provided. Frequent verbal contact made. Routine safety checks conducted q15 minutes.   R: No adverse drug reactions noted. Pt verbally contracts for safety at this time. Pt complaint with medications and treatment plan. Pt interacts minimally with others on the unit. Pt remains safe at this time. Will continue to monitor.

## 2021-06-08 NOTE — Plan of Care (Signed)
°  Problem: Education: Goal: Knowledge of [place] [facility] Education information/materials will improve Outcome: Not Progressing Goal: Emotional status will improve Outcome: Not Progressing Goal: Mental status will improve Outcome: Not Progressing Goal: Verbalization of understanding the information provided will improve Outcome: Not Progressing   Problem: Activity: Goal: Interest or engagement in activities will improve Outcome: Not Progressing Goal: Sleeping patterns will improve Outcome: Not Progressing   Problem: Coping: Goal: Ability to verbalize frustrations and anger appropriately will improve Outcome: Not Progressing Goal: Ability to demonstrate self-control will improve Outcome: Not Progressing   

## 2021-06-08 NOTE — Progress Notes (Signed)
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  Prior to that only known to have had depression treated as an outpatient  Past Medical History:  Past Medical History:  Diagnosis Date   DVT (deep venous thrombosis) (HCC)    left calf   Heart palpitations    Medical history non-contributory    History reviewed. No pertinent surgical history. Family History:  Family History  Problem Relation Age of Onset   Allergies Mother    Cancer Mother        thyroid   Family Psychiatric  History: None reported Social History:  Social History   Substance and Sexual Activity  Alcohol Use No     Social History   Substance and Sexual Activity  Drug Use No    Social History   Socioeconomic History   Marital status: Divorced    Spouse name: Not on file   Number of children: 3   Years of education: Not on file   Highest education level: Not on file  Occupational History   Occupation: Therapist, sports  Tobacco Use   Smoking status: Never   Smokeless tobacco: Never  Vaping Use   Vaping Use: Unknown  Substance and Sexual Activity   Alcohol use: No   Drug use: No   Sexual activity: Yes  Other Topics Concern   Not on file  Social History Narrative   Not on file   Social Determinants of Health   Financial Resource Strain: Not on file  Food Insecurity: Not on file  Transportation Needs: Not on file  Physical Activity: Not on file  Stress: Not on file  Social Connections: Not on file   Additional Social History:                         Sleep: Fair  Appetite:  Fair  Current Medications: Current Facility-Administered Medications  Medication Dose Route Frequency Provider  Last Rate Last Admin   acetaminophen (TYLENOL) tablet 650 mg  650 mg Oral Q6H PRN Miquel Lamson T, MD       alum & mag hydroxide-simeth (MAALOX/MYLANTA) 200-200-20 MG/5ML suspension 30 mL  30 mL Oral Q4H PRN Casimira Sutphin T, MD       benztropine (COGENTIN) tablet 0.5 mg  0.5 mg Oral QHS Jocelyne Reinertsen T, MD       hydrOXYzine (ATARAX) tablet 50 mg  50 mg Oral TID PRN Lamaria Hildebrandt, Madie Reno, MD       magnesium hydroxide (MILK OF MAGNESIA) suspension 30 mL  30 mL Oral Daily PRN Sadaf Przybysz T, MD       risperiDONE (RISPERDAL M-TABS) disintegrating tablet 2 mg  2 mg Oral QHS Davidlee Jeanbaptiste, Madie Reno, MD   2 mg at 06/07/21 2122    Lab Results: No results found for this or any previous visit (from the past 48 hour(s)).  Blood Alcohol level:  Lab Results  Component Value Date   ETH <10 68/04/7516    Metabolic Disorder Labs: Lab Results  Component Value Date   HGBA1C 5.5 06/04/2021   MPG 111 06/04/2021   No results found for: PROLACTIN Lab Results  Component Value Date   CHOL 148 06/04/2021   TRIG 31 06/04/2021  HDL 71 06/04/2021   CHOLHDL 2.1 06/04/2021   VLDL 6 06/04/2021   LDLCALC 71 06/04/2021   LDLCALC 76 05/24/2021    Physical Findings: AIMS:  , ,  ,  ,    CIWA:    COWS:     Musculoskeletal: Strength & Muscle Tone: within normal limits Gait & Station: normal Patient leans: N/A  Psychiatric Specialty Exam:  Presentation  General Appearance: Appropriate for Environment  Eye Contact:Fair  Speech:Blocked  Speech Volume:Normal  Handedness:No data recorded  Mood and Affect  Mood:Dysphoric  Affect:Blunt   Thought Process  Thought Processes:Disorganized  Descriptions of Associations:Loose  Orientation:Full (Time, Place and Person)  Thought Content:Illusions  History of Schizophrenia/Schizoaffective disorder:No  Duration of Psychotic Symptoms:Less than six months  Hallucinations:No data recorded Ideas of Reference:-- (unable to assess)  Suicidal Thoughts:No data  recorded Homicidal Thoughts:No data recorded  Sensorium  Memory:Immediate Poor  Judgment:Poor  Insight:Poor   Executive Functions  Concentration:Poor  Attention Span:Poor  [place]   Psychomotor Activity  Psychomotor Activity:No data recorded  Assets  Assets:Desire for Improvement; Financial Resources/Insurance; Housing; Resilience; Social Support; Physical Health   Sleep  Sleep:No data recorded   Physical Exam: Physical Exam Vitals and nursing note reviewed.  Constitutional:      Appearance: Normal appearance.  HENT:     Head: Normocephalic and atraumatic.     Mouth/Throat:     Pharynx: Oropharynx is clear.  Eyes:     Pupils: Pupils are equal, round, and reactive to light.  Cardiovascular:     Rate and Rhythm: Normal rate and regular rhythm.  Pulmonary:     Effort: Pulmonary effort is normal.     Breath sounds: Normal breath sounds.  Abdominal:     General: Abdomen is flat.     Palpations: Abdomen is soft.  Musculoskeletal:        General: Normal range of motion.  Skin:    General: Skin is warm and dry.  Neurological:     General: No focal deficit present.     Mental Status: She is alert. Mental status is at baseline.  Psychiatric:        Attention and Perception: She is inattentive.        Mood and Affect: Mood normal. Affect is blunt.        Speech: She is noncommunicative. Speech is delayed.        Behavior: Behavior is slowed.        Thought Content: Thought content normal.        Cognition and Memory: Cognition is impaired.   Review of Systems  Constitutional: Negative.   HENT: Negative.    Eyes: Negative.   Respiratory: Negative.    Cardiovascular: Negative.   Gastrointestinal: Negative.   Musculoskeletal: Negative.   Skin: Negative.   Neurological: Negative.   Psychiatric/Behavioral:  The patient has insomnia.   Blood pressure 118/81, pulse (!) 110, temperature 98.3 F (36.8 C), resp.  rate 16, height $RemoveBe'5\' 3"'gtIXHPEyw$  (1.6 m), weight 68 kg, SpO2 100 %. Body mass index is 26.56 kg/m.   Treatment Plan Summary: Medication management and follow-up for this 51 year old woman with complaints of insomnia who also clearly was reporting what sounded like psychotic symptoms and confusion and mood negativity when she came back to the emergency room.  Patient has been very reserved and unwilling to be forthcoming about symptoms since coming downstairs.  Stays in her room most of the time.  Insists there is nothing wrong.  Affect blunted seems reserved and paranoid.  Met with treatment team today.  Patient is on some antipsychotic medication at night.  Encouraged her to be more forthcoming and to talk with staff and to come out of her room and participate in groups.  Alethia Berthold, MD 06/08/2021, 3:47 PM

## 2021-06-08 NOTE — Progress Notes (Signed)
Patient has been isolative to her room. Was apprehensive about taking her medication, but finally agreed to take the Risperdal. She declined her Cogentin stating that she did not have any symptoms that warranted the need for her to take. She denies si hi avh depression and anxiety at this encounter. Will continue to monitor with q15 minute safety rounds.     Cleo Butler-Nicholson, LPN

## 2021-06-08 NOTE — BH IP Treatment Plan (Signed)
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[place])   Current Medications:  Current Facility-Administered Medications  Medication Dose Route Frequency Provider Last Rate Last Admin   acetaminophen (TYLENOL) tablet 650 mg  650 mg Oral Q6H PRN Clapacs, Madie Reno, MD       alum & mag hydroxide-simeth (MAALOX/MYLANTA) 200-200-20 MG/5ML suspension 30 mL  30 mL Oral Q4H PRN Clapacs, John T, MD       benztropine (COGENTIN) tablet 0.5 mg  0.5 mg Oral QHS Clapacs, Madie Reno, MD       hydrOXYzine (ATARAX) tablet 50 mg  50 mg Oral TID PRN Clapacs, Madie Reno, MD       magnesium hydroxide (MILK OF MAGNESIA) suspension 30 mL  30 mL Oral Daily PRN Clapacs, John T, MD       risperiDONE (RISPERDAL M-TABS) disintegrating tablet 2 mg  2 mg Oral QHS Clapacs, John T, MD   2 mg at 06/07/21 2122   PTA Medications: Medications Prior to Admission  Medication Sig Dispense Refill Last Dose   buPROPion (WELLBUTRIN XL) 300 MG 24 hr tablet Take 1 tablet by mouth once daily 30 tablet 5     Patient Stressors: Marital or family conflict    Patient Strengths: Supportive family/friends   Treatment Modalities: Medication Management, Group therapy, Case management,  1 to 1 session with clinician, Psychoeducation, Recreational therapy.   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  Therapeutic Interventions: 1 to 1 sessions, Unit Group sessions and Medication administration.  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  Therapeutic Interventions: 1 to 1 sessions, Unit Group sessions and Medication administration.  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[place]) Long Term Goal(s): Knowledge of disease and therapeutic regimen to maintain health will improve  Short Term Goals: Ability to demonstrate self-control, Ability to participate in decision making will improve, Ability to verbalize feelings will improve, Ability to disclose and discuss suicidal ideas, Ability to identify and develop effective coping behaviors will improve, and Compliance with prescribed medications will improve  Medication Management: RN will administer medications as ordered by provider, will assess and evaluate patient's response and provide education to patient for prescribed medication. RN will report any adverse and/or side effects to prescribing provider.  Therapeutic Interventions: 1 on 1 counseling sessions, Psychoeducation, Medication administration, Evaluate responses to treatment, Monitor vital signs and CBGs as ordered, Perform/monitor CIWA, COWS, AIMS and Fall Risk screenings as ordered, Perform wound care  treatments as ordered.  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  Short Term Goals: Engage patient in aftercare planning with referrals and resources, Increase social support, Increase ability to appropriately verbalize feelings, Increase emotional regulation, Facilitate acceptance of mental health diagnosis and concerns, Facilitate patient progression through stages of change regarding substance use diagnoses and concerns, Identify triggers associated with mental health/substance abuse issues, and Increase skills for wellness and recovery  Therapeutic Interventions: Assess for all discharge needs, 1 to 1 time with Social worker, Explore available resources and support systems, Assess for adequacy in community support network, Educate family and significant other(s) on suicide prevention, Complete Psychosocial Assessment, Interpersonal group therapy.  Evaluation of Outcomes: Not Met   Progress in Treatment: Attending groups: No. Participating in groups: No. Taking medication as prescribed: Yes. Toleration medication: Yes. Family/Significant other contact made: No, will contact:  pt declined collateral contact at this time.  Patient understands diagnosis: No. Discussing patient identified problems/goals with staff: Yes. Medical problems stabilized or resolved: Yes. Denies suicidal/homicidal ideation: Yes. Issues/concerns per patient self-inventory: No. Other: none  New problem(s) identified: No, Describe:  none  New Short Term/Long Term Goal(s): elimination of symptoms of psychosis, medication management for mood stabilization; elimination of SI thoughts; development of comprehensive mental wellness/sobriety plan.   Patient Goals:  "just get better"  Discharge Plan or Barriers: CSW will assist patient in  developing appropriate discharge plans. Pt reports that she will return home and is open to a referral for aftercare plans.   Reason for Continuation of Hospitalization: Anxiety Depression Hallucinations Medication stabilization  Estimated Length of Stay:  1-7 days   Scribe for Treatment Team: Rozann Lesches, Marlinda Mike 06/08/2021 2:40 PM

## 2021-06-08 NOTE — Group Note (Signed)
[facility] LCSW Group Therapy Note   Group Date: 06/08/2021 Start Time: 1300 End Time: 1400  Type of Therapy and Topic:  Group Therapy:  Feelings around Relapse and Recovery  Participation Level:  Minimal   Mood:  Description of Group:    Patients in this group will discuss emotions they experience before and after a relapse. They will process how experiencing these feelings, or avoidance of experiencing them, relates to having a relapse. Facilitator will guide patients to explore emotions they have related to recovery. Patients will be encouraged to process which emotions are more powerful. They will be guided to discuss the emotional reaction significant others in their lives may have to patients relapse or recovery. Patients will be assisted in exploring ways to respond to the emotions of others without this contributing to a relapse.  Therapeutic Goals: Patient will identify two or more emotions that lead to relapse for them:  Patient will identify two emotions that result when they relapse:  Patient will identify two emotions related to recovery:  Patient will demonstrate ability to communicate their needs through discussion and/or role plays.   Summary of Patient Progress: Patient presented to group roughly 15 minutes prior to end. States she has difficulty moderating food intake, snacking often. Patient shared only when questions are directed to her.    Therapeutic Modalities:   Cognitive Behavioral Therapy Solution-Focused Therapy Assertiveness Training Relapse Prevention Therapy   Corky Crafts, [place]

## 2021-06-08 NOTE — [facility] Suicide Risk Assessment (Signed)
[facility] INPATIENT:  Family/Significant Other Suicide Prevention Education  Suicide Prevention Education:  Patient Refusal for Family/Significant Other Suicide Prevention Education: The patient Tonya Watkins has refused to provide written consent for family/significant other to be provided Family/Significant Other Suicide Prevention Education during admission and/or prior to discharge.  Physician notified.  SPE completed with pt, as pt refused to consent to family contact. SPI pamphlet provided to pt and pt was encouraged to share information with support network, ask questions, and talk about any concerns relating to SPE. Pt denies access to guns/firearms and verbalized understanding of information provided. Mobile Crisis information also provided to pt.    Harden Mo 06/08/2021, 2:37 PM

## 2021-06-08 NOTE — Progress Notes (Signed)
Recreation Therapy Notes  INPATIENT RECREATION TR PLAN  Patient Details Name: Tonya Watkins MRN: [id number] DOB: 11/03/1970 Today's Date: 06/08/2021  Rec Therapy Plan Is patient appropriate for Therapeutic Recreation?: Yes Treatment times per week: at least 3 Estimated Length of Stay: 5-7 days TR Treatment/Interventions: Group participation (Comment)  Discharge Criteria Pt will be discharged from therapy if:: Discharged Treatment plan/goals/alternatives discussed and agreed upon by:: Patient/family  Discharge Summary     Amma Crear 06/08/2021, 3:54 PM

## 2021-06-08 NOTE — [facility] Counselor (Signed)
Adult Comprehensive Assessment  Patient ID: Tonya Watkins, female   DOB: 09-Aug-1970, 51 y.o.   MRN: [id number]  Information Source:    Current Stressors:  Patient states their primary concerns and needs for treatment are:: just exhaustion but I feel so much better Patient states their goals for this hospitilization and ongoing recovery are:: just trying to get better.  Im ready to go home. Educational / Learning stressors: none reported Employment / Job issues: none reported Family Relationships: Patient states the primary reason for insomnia are "things are going on with the kids . . . helping them navigate life and becoming men" Patient has two adult sons, though does not provide details about the issues they are experiencing. Financial / Lack of resources (include bankruptcy): none reported Housing / Lack of housing: none reported Physical health (include injuries & life threatening diseases): none reported Social relationships: non reported Substance abuse: none reported Bereavement / Loss: Patient states, "nothing recently"   Living/Environment/Situation:  Living Arrangements: Children Living conditions (as described by patient or guardian): Patient states living conditions are WNL. 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 What is atmosphere in current home: Comfortable, Supportive   Family History:  Marital status: Divorced (Patient she is current exiting a 7-year relationship post-divorce.) Divorced, when?: 2013 What types of issues is patient dealing with in the relationship?: no problems Does patient have children?: Yes How many children?: 3 How is patient's relationship with their children?: Patient has a good relationship with her children   Childhood History:  By whom was/is the patient raised?: Mother, Father Additional childhood history information: Parents divorced and shared custody. My  dad was not in the home, I was raised by my mother Description of patient's relationship with caregiver when they were a child: Patient states, "I had a good relationship with my parents" Patient's description of current relationship with people who raised him/her: Patient continues to have a good relationship with parents. How were you disciplined when you got in trouble as a child/adolescent?: take things away, loss of privileges Does patient have siblings?: Yes Number of Siblings: 2 Description of patient's current relationship with siblings: Patient states she has a good relationship with brother who lives nearby and maintains communication with sister who lives in [place]. Did patient suffer any verbal/emotional/physical/sexual abuse as a child?: No Did patient suffer from severe childhood neglect?: No Has patient ever been sexually abused/assaulted/raped as an adolescent or adult?: No Was the patient ever a victim of a crime or a disaster?: No Witnessed domestic violence?: No   Education:  Highest grade of school patient has completed: associates in nursing Currently a student?: No Learning disability?: No  Employment/Work Situation:   Employment Situation: Employed Where is Patient Currently Employed?: [employer] Long has Patient Been Employed?: unknown Are You Satisfied With Your Job?: Yes Do You Work More Than One Job?: No Patient's Job has Been Impacted by Current Illness: No What is the Longest Time Patient has Held a Job?: "8 years" Where was the Patient Employed at that Time?: "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?: No  Financial Resources:   Financial resources: Income from employment, Private insurance Does patient have a representative payee or guardian?: No   Alcohol/Substance Abuse:   What has been your use of drugs/alcohol within the last 12 months?: Patient denies substance use, however,  UDS was positive for THC If attempted suicide, did drugs/alcohol play a role in this?: No Alcohol/Substance Abuse Treatment Hx: Denies past history Has alcohol/substance abuse ever caused legal problems?: No   Social Support System:   Patient's Community Support System: Good Describe Community Support System: Patient lists multiple people as supportive of her mental health and general wellbeing to include "mother, family, friends" Type of faith/religion: Cristian How does patient's faith help to cope with current illness?: I pray daily    Leisure/Recreation:   Do You Have Hobbies?: Yes Leisure and Hobbies: Patient able to list multiple hobbies to include poetry, cooking, reading, biking, and walking her toy poodle.  Strengths/Needs:   What is the patient's perception of their strengths?: "I believe I am a good cook.  I'm good with my children." Patient states these barriers may affect/interfere with their treatment: Pt needed. Patient states these barriers may affect their return to the community: Pt needed.  Discharge Plan:   Currently receiving community mental health services: No Patient states concerns and preferences for aftercare planning are: Patient reports that she would like a referral for aftercare providers at discharge. Patient states they will know when they are safe and ready for discharge when: "i'm ready now, because I feel much better today and I am ready to go home." Does patient have access to transportation?: Yes Does patient have financial barriers related to discharge medications?: Yes Will patient be returning to same living situation after discharge?: Yes  Summary/Recommendations:   Summary and Recommendations (to be completed by the evaluator): Patient is 51 year old [place] [facility] [place] [place] [facility]  She presents to the hospital under IVC.  Patients chief complaint was my thoughts are no longer my thoughts anymore. Additional triggers have been  a recent break-up from a 7 year relationship and lack of adequate sleep.  Patient has been tearful and unable to explain why she has become upset.  Patient also reported auditory hallucinations, depression, anxiety, however remains preoccupied with being discharged.  Recommendations for pt include: crisis stabilization, therapeutic milieu, encourage group attendance and participation, medication management for mood stabilization, and development for comprehensive mental wellness plan. CSW assessing for appropriate referrals.  Harden Mo. 06/08/2021

## 2021-06-08 NOTE — Progress Notes (Signed)
Recreation Therapy Notes  Date: 06/08/2021  Time: 10:40 am   Location: Craft room      Behavioral response: N/A   Intervention Topic: Self-care  Discussion/Intervention: Patient refused to attend group.   Clinical Observations/Feedback:  Patient refused to attend group.    Stan Cantave LRT/CTRS        Jonte Shiller 06/08/2021 12:47 PM

## 2021-06-09 ENCOUNTER — Inpatient Hospital Stay: Payer: 59

## 2021-06-09 ENCOUNTER — Encounter: Payer: Self-pay | Admitting: Psychiatry

## 2021-06-09 DIAGNOSIS — R002 Palpitations: Secondary | ICD-10-CM

## 2021-06-09 DIAGNOSIS — R079 Chest pain, unspecified: Secondary | ICD-10-CM | POA: Insufficient documentation

## 2021-06-09 DIAGNOSIS — I493 Ventricular premature depolarization: Secondary | ICD-10-CM | POA: Diagnosis present

## 2021-06-09 LAB — TROPONIN I (HIGH SENSITIVITY)
Troponin I (High Sensitivity): 3 ng/L (ref <18)
Troponin I (High Sensitivity): 4 ng/L (ref <18)

## 2021-06-09 LAB — BRAIN NATRIURETIC PEPTIDE: B Natriuretic Peptide: 6.5 pg/mL (ref 0.0–100.0)

## 2021-06-09 LAB — D-DIMER, QUANTITATIVE: D-Dimer, Quant: 0.32 ug/mL-FEU (ref 0.00–0.50)

## 2021-06-09 MED ORDER — POLYETHYLENE GLYCOL 3350 17 G PO PACK: 17.0000 g | PACK | Freq: Every day | ORAL | Status: DC | PRN

## 2021-06-09 MED ORDER — POLYETHYLENE GLYCOL 3350 17 G PO PACK: 17.0000 g | PACK | Freq: Every day | ORAL | Status: DC

## 2021-06-09 NOTE — Progress Notes (Signed)
Patient was up late, complaining of being hungry and was provided with an extra snack. Denies SI, HI and AVH. She refused her cogentin because she said she did not need it, although she knew what it was for. Reluctantly took her Risperdal after asking if she could only take half of it, and was instructed that she needed to take the full dose.

## 2021-06-09 NOTE — Assessment & Plan Note (Addendum)
-   Patient denies any chest pain - I suspect the heart palpitation/chest discomfort is secondary to acute stress/anxiety given that patient endorses having a difficult conversation with family and receiving difficult/unhappy news. - EKG was ordered by attending, Dr. Reita May.  I personally reviewed the EKG and showed sinus rhythm with a rate of 72, QTc 445, PVCs were present. - Two high sensitive troponin, BNP, D-dimer ordered stat - Portable chest x-ray ordered and on my review of the film, was negative for acute cardiopulmonary process. - High sensitive troponins were negative x2 - Hospitalist/medicine will sign off at this time, discussed with Dr. Reita May

## 2021-06-09 NOTE — Plan of Care (Signed)
  Problem: Education: Goal: Knowledge of [facility] Education information/materials will improve Outcome: Progressing Goal: Emotional status will improve Outcome: Progressing Goal: Mental status will improve Outcome: Progressing Goal: Verbalization of understanding the information provided will improve Outcome: Progressing   Problem: Activity: Goal: Interest or engagement in activities will improve Outcome: Progressing Goal: Sleeping patterns will improve Outcome: Progressing   Problem: Coping: Goal: Ability to verbalize frustrations and anger appropriately will improve Outcome: Progressing Goal: Ability to demonstrate self-control will improve Outcome: Progressing   Problem: Health Behavior/Discharge Planning: Goal: Identification of resources available to assist in meeting health care needs will improve Outcome: Progressing Goal: Compliance with treatment plan for underlying cause of condition will improve Outcome: Progressing   Problem: Physical Regulation: Goal: Ability to maintain clinical measurements within normal limits will improve Outcome: Progressing   Problem: Safety: Goal: Periods of time without injury will increase Outcome: Progressing   

## 2021-06-09 NOTE — Hospital Course (Addendum)
Ms. Tonya Watkins is a 51 year old female with history of depression, history of left lower extremity DVT, insomnia, history of heart palpitations, who was admitted to behavioral health unit for brief reactive psychosis on 06/07/2021.  Initial vitals as documented H NP showed temperature of 98.3, respiration rate of 16, heart rate of 110, blood pressure 118/81, SPO2 of 100% on room air.  Patient was started on Risperdal 2 mg nightly and 1/2 mg of Cogentin.

## 2021-06-09 NOTE — Consult Note (Signed)
Consult Note  Tonya Watkins [id number] DOB: March 24, 1971 DOA: 06/07/2021  PCP: Cassandria Anger, MD   I have personally briefly reviewed patient's old medical records in [place].  Reason for hospitalist consult: chest pain Consulting provider: Dr. Daneil Dolin  HPI: Ms. Tonya Watkins is a 51 year old female with history of depression, history of left lower extremity DVT, insomnia, history of heart palpitations, who was admitted to behavioral health unit for brief reactive psychosis on 06/07/2021.  Initial vitals as documented H NP showed temperature of 98.3, respiration rate of 16, heart rate of 110, blood pressure 118/81, SPO2 of 100% on room air.  Patient was started on Risperdal 2 mg nightly and 1/2 mg of Cogentin.  At bedside Ms. Tonya Watkins and with able to tell me her full name, her age, and the current calendar year.  She provided the full HPI.  Throughout the entire interview, she yawned several times.  She was able to sit up and stand up to use the restroom without difficulty or without any visual appearance of weakness or fatigue.  She appeared sleepy and tired.  She reports that today she was talking on the phone with her family when she received some unexpected/unhappy news.  She felt chest discomfort.  She describes it similar to a irregular heart rate.  She denies any chest pain.  Even after prompting her further, she denies feeling pain including chest pressure, sharp pain, dull, stabbing in her chest.  She denies shortness of breath.  She reports this lasted several moments until she was able to collect herself.    She reports the episode was similar to her prior episodes when she had panic attacks whenever she feels anxious and/or under stress.  She reports that in the past the feelings were sporadic similar to today.  She denies any active palpitations during my evaluation.  Pertinent review of system were negative for shortness of breath, fever, sore throat, new cough,  nausea, vomiting, diarrhea, syncope, loss of consciousness, weakness, muscle aches.  Patient's RN, Pamala Hurry, was at bedside to provide chaperone.  Social history: She lives at home with her mother and children.  She denies history of tobacco use including smoking, vaping, chewing.  She reports no recent EtOH use, last drink was July 2022 and it was a glass of wine.  She denies any recreational drug use.  She formally worked as a Marine scientist.  ROS: Constitutional: no weight change, no fever ENT/Mouth: no sore throat Cardiovascular: no chest pain, no dyspnea,  no edema, + palpitations Respiratory: no cough, no sputum, no wheezing Gastrointestinal: no nausea, no vomiting, no diarrhea Genitourinary: no dysuria, no hematuria Musculoskeletal: no arthralgias, no myalgias Neuro: no weakness, no loss of consciousness, no syncope Psych: no anxiety, no depression, no decrease appetite Heme/Lymph: no bruising, no bleeding  Assessment/Plan  Principal Problem:   Palpitations Active Problems:   Brief reactive psychosis (HCC)   PVC's (premature ventricular contractions)    Other Brief reactive psychosis (HCC) Assessment & Plan - Management per primary team  * Palpitations Assessment & Plan - Patient denies any chest pain - I suspect the heart palpitation/chest discomfort is secondary to acute stress/anxiety given that patient endorses having a difficult conversation with family and receiving difficult/unhappy news. - EKG was ordered by attending, Dr. Daneil Dolin.  I personally reviewed the EKG and showed sinus rhythm with a rate of 72, QTc 445, PVCs were present. - Two high sensitive troponin, BNP, D-dimer ordered stat - Portable chest x-ray ordered and on my  review of the film, was negative for acute cardiopulmonary process. - High sensitive troponins were negative x2 - Hospitalist/medicine will sign off at this time, discussed with Dr. Daneil Dolin  Thank you for involving [facility] in the care of  Ms. [name].  [facility] hospital[facility] will sign off at this time.  I have discussed the lab findings with Dr. Daneil Dolin and states that if troponins come back negative x2, we will sign off.  Dr. Daneil Dolin has agreed.  Please reconsult [facility] hospitalist service if needed.  Chart reviewed.   06/02/2021-06/05/2021: Patient was admitted to behavioral health unit for acute psychosis.  Patient was treated with antipsychotic medication and attempts to get her to get a good night sleep.  Antipsychotics were discontinued during this hospitalization as well and patient slept well.  It was noted that patient continued to have slight degree of invasiveness about her and gets just a little feeling of being paranoid although nothing absolutely specific.  No indication of any dangerousness to justify forced hospitalization.  It was noted that patient clonazepam 0.25 mg, phentermine, and valacyclovir 500 mg were discontinued on discharge.  Patient was prescribed bupropion 300 mg daily for major depressive disorder on discharge.  Diet: per primary team  Past Medical History:  Diagnosis Date   DVT (deep venous thrombosis) (HCC)    left calf   Heart palpitations    Medical history non-contributory    History reviewed. No pertinent surgical history.  Social History:  reports that she has never smoked. She has never used smokeless tobacco. She reports that she does not currently use alcohol. She reports that she does not currently use drugs.  No Known Allergies Family History  Problem Relation Age of Onset   Allergies Mother    Cancer Mother        thyroid   Heart block Maternal Grandmother    Family history: Family history reviewed and not pertinent.  Prior to Admission medications   Medication Sig Start Date End Date Taking? Authorizing Provider  buPROPion (WELLBUTRIN XL) 300 MG 24 hr tablet Take 1 tablet by mouth once daily 11/19/20   Plotnikov, Evie Lacks, MD   Physical Exam: Vitals:   06/07/21 1331 06/08/21 0651  06/09/21 0648 06/09/21 1855  BP: 138/89 118/81 125/65 121/73  Pulse: 79 (!) 110 84 85  Resp: 18 16 18    Temp: 98.7 F (37.1 C) 98.3 F (36.8 C) 98.4 F (36.9 C) 98.8 F (37.1 C)  TempSrc: Oral  Oral Oral  SpO2: 100% 100% 100% 100%  Weight: 68 kg     Height: 5\' 3"  (1.6 m)      Constitutional: appears age-appropriate, NAD, calm, comfortable Eyes: PERRL, lids and conjunctivae normal ENMT: Mucous membranes are moist. Posterior pharynx clear of any exudate or lesions. Age-appropriate dentition. Hearing appropriate Neck: normal, supple, no masses, no thyromegaly Respiratory: clear to auscultation bilaterally, no wheezing, no crackles. Normal respiratory effort. No accessory muscle use.  Cardiovascular: Regular rate and rhythm, no murmurs / rubs / gallops. No extremity edema. 2+ pedal pulses. No carotid bruits.  Abdomen: no tenderness, no masses palpated, no hepatosplenomegaly. Bowel sounds positive.  Musculoskeletal: no clubbing / cyanosis. No joint deformity upper and lower extremities. Good ROM, no contractures, no atrophy. Normal muscle tone.  Skin: no rashes, lesions, ulcers. No induration Neurologic: Sensation intact. Strength 5/5 in all 4.  Psychiatric: Normal judgment and insight. Alert and oriented x 3.  Flat affect.  Presence of thought blocking.  EKG: independently reviewed, showing sinus rhythm with  rate of 72, QTc 445, PVCs present.  EKG on 06/01/2021 uploaded on 06/04/2021 is personally reviewed by myself and shows sinus rhythm with rate of 83, QTc 444  Chest x-ray on Admission: I personally reviewed and I agree with radiologist reading as below.  DG Chest Port 1 View  Result Date: 06/09/2021 CLINICAL DATA:  Chest pain EXAM: PORTABLE CHEST 1 VIEW COMPARISON:  06/01/2021 FINDINGS: The heart size and mediastinal contours are within normal limits. Both lungs are clear. The visualized skeletal structures are unremarkable. IMPRESSION: No active disease. Electronically Signed   By:  Inez Catalina M.D.   On: 06/09/2021 20:23    Labs on Admission: I have personally reviewed following labs  CBC: Recent Labs  Lab 06/06/21 1150  WBC 5.4  HGB 12.3  HCT 36.8  MCV 93.6  PLT [id number]   Basic Metabolic Panel: Recent Labs  Lab 06/06/21 1150  NA 141  K 3.4*  CL 106  CO2 27  GLUCOSE 108*  BUN 8  CREATININE 0.75  CALCIUM 9.6   GFR: Estimated Creatinine Clearance: 78.7 mL/min (by C-G formula based on SCr of 0.75 mg/dL).  Liver Function Tests: Recent Labs  Lab 06/06/21 1150  AST 26  ALT 25  ALKPHOS 47  BILITOT 0.6  PROT 7.9  ALBUMIN 4.4   Urine analysis:    Component Value Date/Time   COLORURINE YELLOW (A) 06/01/2021 2007   APPEARANCEUR HAZY (A) 06/01/2021 2007   LABSPEC 1.018 06/01/2021 2007   PHURINE 6.0 06/01/2021 2007   GLUCOSEU NEGATIVE 06/01/2021 2007   GLUCOSEU NEGATIVE 05/24/2021 1306   HGBUR MODERATE (A) 06/01/2021 2007   BILIRUBINUR NEGATIVE 06/01/2021 2007   KETONESUR 5 (A) 06/01/2021 2007   PROTEINUR NEGATIVE 06/01/2021 2007   UROBILINOGEN 0.2 05/24/2021 1306   NITRITE NEGATIVE 06/01/2021 2007   LEUKOCYTESUR TRACE (A) 06/01/2021 2007   Dr. Tobie Poet [facility]  If 7PM-7AM, please contact overnight-coverage provider If 7AM-7PM, please contact day coverage provider [email or web address]  06/09/2021, 11:11 PM

## 2021-06-09 NOTE — Progress Notes (Signed)
Patient approached RN and requested nurse check heart. Stated, "Can you check my heart. I just don't feel right. Feels like it is racing.  RN assessed heart rated. Noted apical pulse to be irregular rate and rhythm. RN observed patient clutching her chest and grimacing. Patient began to minimize pain stating, It's not that bad.  RN's conducted assessment to discover possible causes of chest pain.   Patient says, "I think its' just anxiety. I've had anxiety attacks in the past.   Vital signs and EKG completed. Dr. Reita May called and new orders received. Night nurses notified. Cont Q15 minute check for safety.

## 2021-06-09 NOTE — Assessment & Plan Note (Signed)
Management per primary team. °

## 2021-06-09 NOTE — Progress Notes (Addendum)
[facility] MD Progress Note  06/09/2021 8:36 AM Tonya Watkins  MRN:  [id number] Subjective: Follow-up for the Principal Problem: Brief reactive psychosis ([place]) Diagnosis: Principal Problem:   Brief reactive psychosis ([place])  Total Time spent with patient: 30 minutes  Past Psychiatric History:  Patient is being seen for the first time today.  "I feel okay, been resting well."  Talks about having negative thoughts, which is the reason that she came, per patient.  Then reveals that she has been experiencing depression.  Indicating that she has been feeling a lot of stress recently, especially about her appearance.  Does not elaborate further.  Denies anxiety denies delusions or hallucinations.  Tolerating her medication all right.Depressions a 4-5 out of 10 with 10 being high. Stares off in space and will have to repeat questions at time. Reports mild constipation.  Past Medical History:  Past Medical History:  Diagnosis Date   DVT (deep venous thrombosis) (HCC)    left calf   Heart palpitations    Medical history non-contributory    History reviewed. No pertinent surgical history. Family History:  Family History  Problem Relation Age of Onset   Allergies Mother    Cancer Mother        thyroid   Family Psychiatric  History: None reported Social History:  Social History   Substance and Sexual Activity  Alcohol Use No     Social History   Substance and Sexual Activity  Drug Use No    Social History   Socioeconomic History   Marital status: Divorced    Spouse name: Not on file   Number of children: 3   Years of education: Not on file   Highest education level: Not on file  Occupational History   Occupation: RN  Tobacco Use   Smoking status: Never   Smokeless tobacco: Never  Vaping Use   Vaping Use: Unknown  Substance and Sexual Activity   Alcohol use: No   Drug use: No   Sexual activity: Yes  Other Topics Concern   Not on file  Social History Narrative   Not on file    Social Determinants of Health   Financial Resource Strain: Not on file  Food Insecurity: Not on file  Transportation Needs: Not on file  Physical Activity: Not on file  Stress: Not on file  Social Connections: Not on file   Additional Social History:                         Sleep: Fair  Appetite:  Fair  Current Medications: Current Facility-Administered Medications  Medication Dose Route Frequency Provider Last Rate Last Admin   acetaminophen (TYLENOL) tablet 650 mg  650 mg Oral Q6H PRN Clapacs, John T, MD       alum & mag hydroxide-simeth (MAALOX/MYLANTA) 200-200-20 MG/5ML suspension 30 mL  30 mL Oral Q4H PRN Clapacs, John T, MD       benztropine (COGENTIN) tablet 0.5 mg  0.5 mg Oral QHS Clapacs, John T, MD       hydrOXYzine (ATARAX) tablet 50 mg  50 mg Oral TID PRN Clapacs, Madie Reno, MD       magnesium hydroxide (MILK OF MAGNESIA) suspension 30 mL  30 mL Oral Daily PRN Clapacs, John T, MD       risperiDONE (RISPERDAL M-TABS) disintegrating tablet 2 mg  2 mg Oral QHS Clapacs, Madie Reno, MD   2 mg at 06/08/21 2109    Lab Results:  No results found for this or any previous visit (from the past 48 hour(s)).  Blood Alcohol level:  Lab Results  Component Value Date   ETH <10 27/61/8485    Metabolic Disorder Labs: Lab Results  Component Value Date   HGBA1C 5.5 06/04/2021   MPG 111 06/04/2021   No results found for: PROLACTIN Lab Results  Component Value Date   CHOL 148 06/04/2021   TRIG 31 06/04/2021   HDL 71 06/04/2021   CHOLHDL 2.1 06/04/2021   VLDL 6 06/04/2021   LDLCALC 71 06/04/2021   LDLCALC 76 05/24/2021    Physical Findings: AIMS:  , ,  ,  ,    CIWA:    COWS:     Musculoskeletal: Strength & Muscle Tone: within normal limits Gait & Station: normal Patient leans: N/A  Psychiatric Specialty Exam:  Presentation  General Appearance: Appropriate for Environment  Eye Contact:Fair  Speech:Blocked  Speech Volume:Normal  Handedness:No data  recorded  Mood and Affect  Mood:better Affect: restricted  Thought Process  Thought Processes:Disorganized  Descriptions of Associations:Loose  Orientation:Full (Time, Place and Person)  Thought Content:Illusions  History of Schizophrenia/Schizoaffective disorder:No  Duration of Psychotic Symptoms:Less than six months  Hallucinations:No data recorded Ideas of Reference:-- (unable to assess)  Suicidal Thoughts:No data recorded Homicidal Thoughts:No data recorded  Sensorium  Memory:Immediate Poor  Judgment:Poor  Insight:Poor   Executive Functions  Concentration:Poor  Attention Span:Poor  [place]   Psychomotor Activity  Psychomotor Activity:No data recorded  Assets  Assets:Desire for Improvement; Financial Resources/Insurance; Housing; Resilience; Social Support; Physical Health   Sleep  Sleep:No data recorded  Review of systems: Please see HPI, other review systems negative  Blood pressure 125/65, pulse 84, temperature 98.4 F (36.9 C), temperature source Oral, resp. rate 18, height _0  (1.6 m), weight 68 kg, SpO2 100 %. Body mass index is 26.56 kg/m.   Treatment Plan Summary: Medication management and follow-up for this 51 year old woman with complaints of insomnia who also clearly was reporting what sounded like psychotic symptoms and confusion and mood negativity when she came back to the emergency room.  Patient has been very reserved and unwilling to be forthcoming about symptoms since coming downstairs.  Stays in her room most of the time.  Insists there is nothing wrong.  Affect blunted seems reserved and paranoid.  Met with treatment team today.  Patient is on some antipsychotic medication at night.  Encouraged her to be more forthcoming and to talk with staff and to come out of her room and participate in groups.  1/28 MiraLAX 17 g p.o. daily as needed constipation  Rulon Sera, MD 06/09/2021, 8:36  AM

## 2021-06-09 NOTE — Progress Notes (Signed)
D: Pt alert and oriented. Pt rates depression 0/10, hopelessness 0/10, and anxiety 0/10. Pt goal: "I feel much better today. My thoughts are clear and I feel like I'm ready to do home. Pt reports energy level as normal and concentration as being good. Pt reports sleep last night as being good. Pt did not receive medications for sleep. Pt denies experiencing any pain at this time. Pt denies experiencing any SI/HI, or AVH at this time.   Pt reports experiencing depression this afternoon when she thinks about the breakup and being alone. This Clinical research associate and the patient discussed coping skills, attending group, and the importance of healthy relationships. Pt also reports that she does hear voices at times but has not yet today. Pt reassures this Clinical research associate she with let this Clinical research associate know if she does begin to hear voices.  A: Support and encouragement provided. Frequent verbal contact made. Routine safety checks conducted q15 minutes.   R: No adverse drug reactions noted. Pt verbally contracts for safety at this time. Pt interacts well with others on the unit. Pt remains safe at this time. Will continue to monitor.

## 2021-06-10 NOTE — Progress Notes (Signed)
D: Pt alert and oriented. Pt denies experiencing any anxiety/depression at this time. Pt denies experiencing any pain at this time. Pt denies experiencing any SI/HI, or AVH at this time.   A: Scheduled medications administered to pt, per MD orders. Support and encouragement provided. Frequent verbal contact made. Routine safety checks conducted q15 minutes.   R: No adverse drug reactions noted. Pt verbally contracts for safety at this time. Pt complaint with medications and treatment plan. Pt interacts minimally with others on the unit. Pt remains safe at this time. Will continue to monitor.

## 2021-06-10 NOTE — Group Note (Addendum)
LCSW Group Therapy Note  Group Date: 06/10/2021 Start Time: [id number] End Time: 1400   Type of Therapy and Topic:  Group Therapy - How To Cope with Nervousness about Discharge   Participation Level:  None   Description of Group This process group involved identification of patients' feelings about discharge. Some of them are scheduled to be discharged soon, while others are new admissions, but each of them was asked to share thoughts and feelings surrounding discharge from the hospital. One common theme was that they are excited at the prospect of going home, while another was that many of them are apprehensive about sharing why they were hospitalized. Patients were given the opportunity to discuss these feelings with their peers in preparation for discharge.  Therapeutic Goals  Patient will identify their overall feelings about pending discharge. Patient will think about how they might proactively address issues that they believe will once again arise once they get home (i.e. with parents). Patients will participate in discussion about having hope for change.   Summary of Patient Progress: Patient presented to group 5 minutes after the start of the session. Patient was invited to participate; however, stated that she was "just listening and observing". Patient was observed reading the supplemental handout. Patient left group early.    Therapeutic Modalities Cognitive Behavioral Therapy   Berniece Salines, LCSWA 06/10/2021  2:10 PM

## 2021-06-10 NOTE — Progress Notes (Signed)
[facility] MD Progress Note  06/10/2021 8:00 AM Tonya Watkins  MRN:  [id number] Subjective: Follow-up for the Principal Problem: Palpitations Diagnosis: Principal Problem:   Palpitations Active Problems:   Brief reactive psychosis (HCC)   PVC's (premature ventricular contractions)  Total Time spent with patient: 33 minutes.  Past Psychiatric History:  1/27 Patient experienced an acute onset of nonradiating chest pain last evening.  I was able to contact Dr. Tobie Poet who evaluated the patient.  Organic etiology was ruled out with EKG, serial troponins, D-dimer and BNP, and chest x-ray.  Today, patient tells me that she is feeling much better.  States it did has some difficulty falling asleep but rested well after she did.  Tells me that she was very anxious after a phone call with her mother who had told her that her grandmother was having heart problems and something dealing with her son which she is vague about.  "You know every day life."  She emphasizes several times how much better she feels.  She hesitantly acknowledges that she was hearing voices prior to admission, negative self self talk."  Then indicates that she was having paranoia but then quickly denies this.  "I feel good."  Denies depression or anxiety.  No side effects on the medication.  No new medical problems.  1/28 Patient is being seen for the first time today.  "I feel okay, been resting well."  Talks about having negative thoughts, which is the reason that she came, per patient.  Then reveals that she has been experiencing depression.  Indicating that she has been feeling a lot of stress recently, especially about her appearance.  Does not elaborate further.  Denies anxiety denies delusions or hallucinations.  Tolerating her medication all right.Depressions a 4-5 out of 10 with 10 being high. Stares off in space and will have to repeat questions at time. Reports mild constipation.  Past Medical History:  Past Medical History:   Diagnosis Date   DVT (deep venous thrombosis) (HCC)    left calf   Heart palpitations    Medical history non-contributory    History reviewed. No pertinent surgical history. Family History:  Family History  Problem Relation Age of Onset   Allergies Mother    Cancer Mother        thyroid   Heart block Maternal Grandmother    Family Psychiatric  History: None reported Social History:  Social History   Substance and Sexual Activity  Alcohol Use Not Currently     Social History   Substance and Sexual Activity  Drug Use Not Currently    Social History   Socioeconomic History   Marital status: Divorced    Spouse name: Not on file   Number of children: 3   Years of education: Not on file   Highest education level: Not on file  Occupational History   Occupation: RN  Tobacco Use   Smoking status: Never   Smokeless tobacco: Never  Vaping Use   Vaping Use: Unknown  Substance and Sexual Activity   Alcohol use: Not Currently   Drug use: Not Currently   Sexual activity: Yes  Other Topics Concern   Not on file  Social History Narrative   Not on file   Social Determinants of Health   Financial Resource Strain: Not on file  Food Insecurity: Not on file  Transportation Needs: Not on file  Physical Activity: Not on file  Stress: Not on file  Social Connections: Not on file   Additional  Social History:                         Sleep: Fair  Appetite:  Fair  Current Medications: Current Facility-Administered Medications  Medication Dose Route Frequency Provider Last Rate Last Admin   acetaminophen (TYLENOL) tablet 650 mg  650 mg Oral Q6H PRN Clapacs, Madie Reno, MD       alum & mag hydroxide-simeth (MAALOX/MYLANTA) 200-200-20 MG/5ML suspension 30 mL  30 mL Oral Q4H PRN Clapacs, Madie Reno, MD       benztropine (COGENTIN) tablet 0.5 mg  0.5 mg Oral QHS Clapacs, Madie Reno, MD       hydrOXYzine (ATARAX) tablet 50 mg  50 mg Oral TID PRN Clapacs, Madie Reno, MD   50 mg at  06/09/21 1905   magnesium hydroxide (MILK OF MAGNESIA) suspension 30 mL  30 mL Oral Daily PRN Clapacs, Madie Reno, MD       polyethylene glycol (MIRALAX / GLYCOLAX) packet 17 g  17 g Oral Daily PRN Rulon Sera, MD       risperiDONE (RISPERDAL M-TABS) disintegrating tablet 2 mg  2 mg Oral QHS Clapacs, John T, MD   2 mg at 06/09/21 2117    Lab Results:  Results for orders placed or performed during the hospital encounter of 06/07/21 (from the past 48 hour(s))  Troponin I (High Sensitivity)     Status: None   Collection Time: 06/09/21  8:08 PM  Result Value Ref Range   Troponin I (High Sensitivity) 3 <18 ng/L    Comment: (NOTE) Elevated high sensitivity troponin I (hsTnI) values and significant  changes across serial measurements may suggest ACS but many other  chronic and acute conditions are known to elevate hsTnI results.  Refer to the "Links" section for chest pain algorithms and additional  guidance. Performed at [facility], [address] [place], [place] [address]   Brain natriuretic peptide     Status: None   Collection Time: 06/09/21  8:08 PM  Result Value Ref Range   B Natriuretic Peptide 6.5 0.0 - 100.0 pg/mL    Comment: Performed at [facility], [address] [place], [place] [address]  D-dimer, quantitative     Status: None   Collection Time: 06/09/21  8:33 PM  Result Value Ref Range   D-Dimer, Quant 0.32 0.00 - 0.50 ug/mL-FEU    Comment: (NOTE) At the manufacturer cut-off value of 0.5 g/mL FEU, this assay has a negative predictive value of 95-100%.This assay is intended for use in conjunction with a clinical pretest probability (PTP) assessment model to exclude pulmonary embolism (PE) and deep venous thrombosis (DVT) in outpatients suspected of PE or DVT. Results should be correlated with clinical presentation. Performed at [facility], [place], [place] [address]   Troponin I (High Sensitivity)     Status: None    Collection Time: 06/09/21 10:04 PM  Result Value Ref Range   Troponin I (High Sensitivity) 4 <18 ng/L    Comment: (NOTE) Elevated high sensitivity troponin I (hsTnI) values and significant  changes across serial measurements may suggest ACS but many other  chronic and acute conditions are known to elevate hsTnI results.  Refer to the "Links" section for chest pain algorithms and additional  guidance. Performed at [facility], [address] [place], [place] [address]     Blood Alcohol level:  Lab Results  Component Value Date   [facility] <10 06/06/2021  Metabolic Disorder Labs: Lab Results  Component Value Date   HGBA1C 5.5 06/04/2021   MPG 111 06/04/2021   No results found for: PROLACTIN Lab Results  Component Value Date   CHOL 148 06/04/2021   TRIG 31 06/04/2021   HDL 71 06/04/2021   CHOLHDL 2.1 06/04/2021   VLDL 6 06/04/2021   LDLCALC 71 06/04/2021   LDLCALC 76 05/24/2021    Physical Findings: AIMS:  , ,  ,  ,    CIWA:    COWS:     Musculoskeletal: Strength & Muscle Tone: within normal limits Gait & Station: normal Patient leans: N/A  Psychiatric Specialty Exam:  Presentation  General Appearance: Appropriate for Environment  Eye Contact: Good Speech: Within normal limits Speech Volume:Normal  Handedness:No data recorded  Mood and Affect  Mood: Good Affect: Brighter  Thought Process  Thought Processes:Disorganized  Descriptions of Associations:Loose  Orientation:Full (Time, Place and Person)  Thought Content:Illusions  History of Schizophrenia/Schizoaffective disorder:No  Duration of Psychotic Symptoms:Less than six months  Hallucinations:No data recorded Ideas of Reference:-- (unable to assess)  Suicidal Thoughts:No data recorded Homicidal Thoughts:No data recorded  Sensorium  Memory:Immediate Poor  Judgment: Fair Insight:Poor   Executive Functions  Concentration: Good Attention Span:Poor  Recall: [place]   Psychomotor Activity  Psychomotor Activity:No data recorded  Assets  Assets:Desire for Improvement; Financial Resources/Insurance; Housing; Resilience; Social Support; Physical Health   Sleep  Sleep:No data recorded  Review of systems: Please see HPI, other review systems negative  Blood pressure 119/78, pulse 84, temperature (!) 97.5 F (36.4 C), temperature source Oral, resp. rate 18, height $RemoveBe'5\' 3"'fMmcRovuS$  (1.6 m), weight 68 kg, SpO2 100 %. Body mass index is 26.56 kg/m.   Treatment Plan Summary: Medication management and follow-up for this 51 year old woman with complaints of insomnia who also clearly was reporting what sounded like psychotic symptoms and confusion and mood negativity when she came back to the emergency room.  Patient has been very reserved and unwilling to be forthcoming about symptoms since coming downstairs.  Stays in her room most of the time.  Insists there is nothing wrong.  Affect blunted seems reserved and paranoid.  Met with treatment team today.  Patient is on some antipsychotic medication at night.  Encouraged her to be more forthcoming and to talk with staff and to come out of her room and participate in groups.  1/28 MiraLAX 17 g p.o. daily as needed constipation  1/29 Appreciate Dr. Tobie Poet evaluating the patient last night. No changes  Rulon Sera, MD 06/10/2021, 8:00 AM

## 2021-06-10 NOTE — Plan of Care (Signed)
  Problem: Education: Goal: Knowledge of [facility] Education information/materials will improve Outcome: Progressing Goal: Emotional status will improve Outcome: Progressing Goal: Mental status will improve Outcome: Progressing Goal: Verbalization of understanding the information provided will improve Outcome: Progressing   Problem: Activity: Goal: Interest or engagement in activities will improve Outcome: Progressing Goal: Sleeping patterns will improve Outcome: Progressing   Problem: Coping: Goal: Ability to verbalize frustrations and anger appropriately will improve Outcome: Progressing Goal: Ability to demonstrate self-control will improve Outcome: Progressing   Problem: Health Behavior/Discharge Planning: Goal: Identification of resources available to assist in meeting health care needs will improve Outcome: Progressing Goal: Compliance with treatment plan for underlying cause of condition will improve Outcome: Progressing   Problem: Physical Regulation: Goal: Ability to maintain clinical measurements within normal limits will improve Outcome: Progressing   Problem: Safety: Goal: Periods of time without injury will increase Outcome: Progressing   

## 2021-06-10 NOTE — Progress Notes (Signed)
Patient complained of chest pain right before shift change. She was seen by the hospitalist and had serial troponins, D-dimer, and BNP along with chest xray and EKG which were indicative of anxiety secondary to stress. Denies SI, HI and AVH but said she had gotten some news from her family that was upsetting prior to having the chest pain but did not go into detail. No further complaints during the night

## 2021-06-11 MED ORDER — MIRTAZAPINE 15 MG PO TABS
15.0000 mg | ORAL_TABLET | Freq: Every day | ORAL | Status: DC
  Administered 2021-06-13 – 2021-06-14 (×2): 15 mg via ORAL
  Filled 2021-06-11 (×2): qty 1

## 2021-06-11 NOTE — Progress Notes (Signed)
D: Pt alert and oriented. Pt rates depression 0/10, hopelessness 0/10, and anxiety 0/10. Pt goal: "My goal is to be discharged today to go home with my mom to my family and to be sure to take my medications as ordered by the physician and follow physician's orders as stated on my discharge paperwork. Pt reports energy level as normal and concentration as being good. Pt reports sleep last night as being good. Pt did not receive medications for sleep and did not find them helpful. Pt denies experiencing any pain at this time. Pt denies experiencing any SI/HI, or AVH at this time.   Pt's mother called this morning informing this writer that her daughter had called twice this morning telling her she was being discharged and needed her to come and pick her up.Pt's mother asked if this was true and this Clinical research associate informed her that to my knowledge she was not being discharged today, the physician had not told this writer anything at this time about this pt discharging today. This Clinical research associate assured the mother that if anything changed this Clinical research associate would contact the mother and let her know.   After the conversation with the pt's mother the pt had pushed her call button. This writer went to check on the pt asking her what was wrong and seeing if she was in need of anything. This pt was visibly anxious and tearful. The pt said that she did not feel safe in her body. When asked what she meant by this she replied never mind followed by I feel like someone here is going to hurt when. This Clinical research associate asked who she thought was going to hurt her and she informed this Clinical research associate the other patient. This had no particular reason why another patient was going to hurt her other than her feels/thought. Pt was assured that the staff would not allow anyone to hurt her. Pt then asked for something for her anxiety and was offered prn vistaril. At first the pt was accepting of taking the vistaril and wanted it followed by not wanting it and stating it  makes her feel bad, and didn't like the way it made her feel. Pt asked if there was something else she could take. This Clinical research associate explained it was the only prn medication for her anxiety in her MAR and reminded pt of how she wasn't taking her scheduled night medications and that taking them would help in her mood.The pt stated I know, but can someone come sit with me all the time. This Clinical research associate suggested she come sit in the community room where the MHT's could see her and then she wouldn't be alone. The pt did not like that suggestion and decided she would rather read a book in her room after she hesitantly took the prn vistaril. This Clinical research associate and the MHT's have been closely monitoring this pt throughout the day.  A: Scheduled medications administered to pt, per MD orders. Support and encouragement provided. Frequent verbal contact made. Routine safety checks conducted q15 minutes.   R: No adverse drug reactions noted. Pt verbally contracts for safety at this time. Pt complaint with medications. Pt interacts minimally with others on the unit. Pt remains safe at this time. Will continue to monitor.

## 2021-06-11 NOTE — BHH Counselor (Signed)
CSW received message from mental health tech that patient's mother would like a phone call.  CSW explained that at this time the patient has declined but CSW would follow up with the patient.   CSW did meet with patient and explained the situation.  CSW had to explain several times for patient to understand.  CSW explained that pt's mother had called requesting to speak to CSW, however, without permission CSW can not return call.  Pt thought CSW was saying she needed to call her mother and CSW had to explain several times.   CSW asked for permission to speak with mother and pt again declined.  Assunta Curtis, MSW, LCSW 06/11/2021 12:09 PM

## 2021-06-11 NOTE — Progress Notes (Signed)
[facility] MD Progress Note  06/11/2021 4:57 PM Tonya Watkins  MRN:  [id number] Subjective: Follow-up for this patient with somewhat peculiar presentation.  Today sought her in her room staring straight ahead crying.  Patient finally admitted to me today her believe that she had opened her "chakras" and now was receiving too much information and this was causing her thoughts to be "not my own".  Thoughts seem to be disorganized.  Tearful also.  Very negative at times.  Slowed down.  Seeming more like a psychotic depression. Principal Problem: Palpitations Diagnosis: Principal Problem:   Palpitations Active Problems:   Brief reactive psychosis (HCC)   PVC's (premature ventricular contractions)  Total Time spent with patient: 30 minutes  Past Psychiatric History: Past history of depression and more recently not cooperative with treatment  Past Medical History:  Past Medical History:  Diagnosis Date   DVT (deep venous thrombosis) (HCC)    left calf   Heart palpitations    Medical history non-contributory    History reviewed. No pertinent surgical history. Family History:  Family History  Problem Relation Age of Onset   Allergies Mother    Cancer Mother        thyroid   Heart block Maternal Grandmother    Family Psychiatric  History: See previous Social History:  Social History   Substance and Sexual Activity  Alcohol Use Not Currently     Social History   Substance and Sexual Activity  Drug Use Not Currently    Social History   Socioeconomic History   Marital status: Divorced    Spouse name: Not on file   Number of children: 3   Years of education: Not on file   Highest education level: Not on file  Occupational History   Occupation: RN  Tobacco Use   Smoking status: Never   Smokeless tobacco: Never  Vaping Use   Vaping Use: Unknown  Substance and Sexual Activity   Alcohol use: Not Currently   Drug use: Not Currently   Sexual activity: Yes  Other Topics Concern    Not on file  Social History Narrative   Not on file   Social Determinants of Health   Financial Resource Strain: Not on file  Food Insecurity: Not on file  Transportation Needs: Not on file  Physical Activity: Not on file  Stress: Not on file  Social Connections: Not on file   Additional Social History:                         Sleep: Fair  Appetite:  Fair  Current Medications: Current Facility-Administered Medications  Medication Dose Route Frequency Provider Last Rate Last Admin   acetaminophen (TYLENOL) tablet 650 mg  650 mg Oral Q6H PRN Mafalda Mcginniss T, MD       alum & mag hydroxide-simeth (MAALOX/MYLANTA) 200-200-20 MG/5ML suspension 30 mL  30 mL Oral Q4H PRN Zyshonne Malecha T, MD       benztropine (COGENTIN) tablet 0.5 mg  0.5 mg Oral QHS Gisell Buehrle T, MD       hydrOXYzine (ATARAX) tablet 50 mg  50 mg Oral TID PRN Ireoluwa Grant T, MD   50 mg at 06/11/21 1140   magnesium hydroxide (MILK OF MAGNESIA) suspension 30 mL  30 mL Oral Daily PRN Devanny Palecek, Madie Reno, MD       polyethylene glycol (MIRALAX / GLYCOLAX) packet 17 g  17 g Oral Daily PRN Rulon Sera, MD  risperiDONE (RISPERDAL M-TABS) disintegrating tablet 2 mg  2 mg Oral QHS Chevon Laufer, Madie Reno, MD   2 mg at 06/09/21 2117    Lab Results:  Results for orders placed or performed during the hospital encounter of 06/07/21 (from the past 48 hour(s))  Troponin I (High Sensitivity)     Status: None   Collection Time: 06/09/21  8:08 PM  Result Value Ref Range   Troponin I (High Sensitivity) 3 <18 ng/L    Comment: (NOTE) Elevated high sensitivity troponin I (hsTnI) values and significant  changes across serial measurements may suggest ACS but many other  chronic and acute conditions are known to elevate hsTnI results.  Refer to the "Links" section for chest pain algorithms and additional  guidance. Performed at [facility], [address] [place], [place] [address]   Brain natriuretic peptide      Status: None   Collection Time: 06/09/21  8:08 PM  Result Value Ref Range   B Natriuretic Peptide 6.5 0.0 - 100.0 pg/mL    Comment: Performed at [facility], [address] [place], [place] [address]  D-dimer, quantitative     Status: None   Collection Time: 06/09/21  8:33 PM  Result Value Ref Range   D-Dimer, Quant 0.32 0.00 - 0.50 ug/mL-FEU    Comment: (NOTE) At the manufacturer cut-off value of 0.5 g/mL FEU, this assay has a negative predictive value of 95-100%.This assay is intended for use in conjunction with a clinical pretest probability (PTP) assessment model to exclude pulmonary embolism (PE) and deep venous thrombosis (DVT) in outpatients suspected of PE or DVT. Results should be correlated with clinical presentation. Performed at [facility], [address], [place], [place] [address]   Troponin I (High Sensitivity)     Status: None   Collection Time: 06/09/21 10:04 PM  Result Value Ref Range   Troponin I (High Sensitivity) 4 <18 ng/L    Comment: (NOTE) Elevated high sensitivity troponin I (hsTnI) values and significant  changes across serial measurements may suggest ACS but many other  chronic and acute conditions are known to elevate hsTnI results.  Refer to the "Links" section for chest pain algorithms and additional  guidance. Performed at [facility], [address] [place], [place] [address]     Blood Alcohol level:  Lab Results  Component Value Date   [facility] <10 [id number]    Metabolic Disorder Labs: Lab Results  Component Value Date   HGBA1C 5.5 06/04/2021   MPG 111 06/04/2021   No results found for: PROLACTIN Lab Results  Component Value Date   CHOL 148 06/04/2021   TRIG 31 06/04/2021   HDL 71 06/04/2021   CHOLHDL 2.1 06/04/2021   VLDL 6 06/04/2021   LDLCALC 71 06/04/2021   LDLCALC 76 05/24/2021    Physical Findings: AIMS:  , ,  ,  ,    CIWA:    COWS:     Musculoskeletal: Strength & Muscle Tone:  within normal limits Gait & Station: normal Patient leans: N/A  Psychiatric Specialty Exam:  Presentation  General Appearance: Appropriate for Environment  Eye Contact:Fair  Speech:Blocked  Speech Volume:Normal  Handedness:No data recorded  Mood and Affect  Mood:Dysphoric  Affect:Blunt   Thought Process  Thought Processes:Disorganized  Descriptions of Associations:Loose  Orientation:Full (Time, Place and Person)  Thought Content:Illusions  History of Schizophrenia/Schizoaffective disorder:No  Duration of Psychotic Symptoms:Less than six months  Hallucinations:No data recorded Ideas of Reference:-- (unable to assess)  Suicidal Thoughts:No  data recorded Homicidal Thoughts:No data recorded  Sensorium  Memory:Immediate Poor  Judgment:Poor  Insight:Poor   Executive Functions  Concentration:Poor  Attention Span:Poor  [place]   Psychomotor Activity  Psychomotor Activity:No data recorded  Assets  Assets:Desire for Improvement; Financial Resources/Insurance; Housing; Resilience; Social Support; Physical Health   Sleep  Sleep:No data recorded   Physical Exam: Physical Exam Vitals and nursing note reviewed.  Constitutional:      Appearance: Normal appearance.  HENT:     Head: Normocephalic and atraumatic.     Mouth/Throat:     Pharynx: Oropharynx is clear.  Eyes:     Pupils: Pupils are equal, round, and reactive to light.  Cardiovascular:     Rate and Rhythm: Normal rate and regular rhythm.  Pulmonary:     Effort: Pulmonary effort is normal.     Breath sounds: Normal breath sounds.  Abdominal:     General: Abdomen is flat.     Palpations: Abdomen is soft.  Musculoskeletal:        General: Normal range of motion.  Skin:    General: Skin is warm and dry.  Neurological:     General: No focal deficit present.     Mental Status: She is alert. Mental status is at baseline.  Psychiatric:         Attention and Perception: She is inattentive.        Mood and Affect: Mood normal. Affect is inappropriate.        Speech: Speech is tangential.        Behavior: Behavior is withdrawn.        Thought Content: Thought content is delusional.   Review of Systems  Constitutional: Negative.   HENT: Negative.    Eyes: Negative.   Respiratory: Negative.    Cardiovascular: Negative.   Gastrointestinal: Negative.   Musculoskeletal: Negative.   Skin: Negative.   Neurological: Negative.   Psychiatric/Behavioral:  Negative for depression, substance abuse and suicidal ideas. The patient is nervous/anxious and has insomnia.   Blood pressure 128/69, pulse 80, temperature 98.1 F (36.7 C), temperature source Oral, resp. rate 18, height 5\' 3"  (1.6 m), weight 68 kg, SpO2 100 %. Body mass index is 26.56 kg/m.   Treatment Plan Summary: Medication management and Plan psychoeducation with the patient.  She is already on Risperdal.  I am going to add mirtazapine explaining to her that the previous Wellbutrin did not seem to be having the effect it needed to be having.  Encourage group attendance.  Alethia Berthold, MD 06/11/2021, 4:57 PM

## 2021-06-11 NOTE — Group Note (Signed)
[facility] LCSW Group Therapy Note ° ° ° °Group Date: 06/11/2021 °Start Time: 1300 °End Time: 1400 ° °Type of Therapy and Topic:  Group Therapy:  Overcoming Obstacles ° °Participation Level:  [facility] PARTICIPATION LEVEL: Did Not Attend ° °Mood: ° °Description of Group:   °In this group patients will be encouraged to explore what they see as obstacles to their own wellness and recovery. They will be guided to discuss their thoughts, feelings, and behaviors related to these obstacles. The group will process together ways to cope with barriers, with attention given to specific choices patients can make. Each patient will be challenged to identify changes they are motivated to make in order to overcome their obstacles. This group will be process-oriented, with patients participating in exploration of their own experiences as well as giving and receiving support and challenge from other group members. ° °Therapeutic Goals: °1. Patient will identify personal and current obstacles as they relate to admission. °2. Patient will identify barriers that currently interfere with their wellness or overcoming obstacles.  °3. Patient will identify feelings, thought process and behaviors related to these barriers. °4. Patient will identify two changes they are willing to make to overcome these obstacles:  ° ° °Summary of Patient Progress ° ° °X ° ° °Therapeutic Modalities:   °Cognitive Behavioral Therapy °Solution Focused Therapy °Motivational Interviewing °Relapse Prevention Therapy ° ° °Wilhelmina Hark J Jalaina Salyers, LCSW °

## 2021-06-11 NOTE — Plan of Care (Signed)
?  Problem: Education: ?Goal: Knowledge of [place] [facility] Education information/materials will improve ?Outcome: Not Progressing ?Goal: Emotional status will improve ?Outcome: Not Progressing ?Goal: Mental status will improve ?Outcome: Not Progressing ?Goal: Verbalization of understanding the information provided will improve ?Outcome: Not Progressing ?  ?Problem: Activity: ?Goal: Interest or engagement in activities will improve ?Outcome: Not Progressing ?Goal: Sleeping patterns will improve ?Outcome: Not Progressing ?  ?Problem: Coping: ?Goal: Ability to verbalize frustrations and anger appropriately will improve ?Outcome: Not Progressing ?Goal: Ability to demonstrate self-control will improve ?Outcome: Not Progressing ?  ?Problem: Health Behavior/Discharge Planning: ?Goal: Identification of resources available to assist in meeting health care needs will improve ?Outcome: Not Progressing ?Goal: Compliance with treatment plan for underlying cause of condition will improve ?Outcome: Not Progressing ?  ?Problem: Physical Regulation: ?Goal: Ability to maintain clinical measurements within normal limits will improve ?Outcome: Not Progressing ?  ?Problem: Safety: ?Goal: Periods of time without injury will increase ?Outcome: Not Progressing ?  ?

## 2021-06-11 NOTE — Plan of Care (Signed)
?  Problem: Education: ?Goal: Knowledge of [place] [facility] Education information/materials will improve ?Outcome: Not Progressing ?Goal: Emotional status will improve ?Outcome: Not Progressing ?Goal: Mental status will improve ?Outcome: Not Progressing ?Goal: Verbalization of understanding the information provided will improve ?Outcome: Not Progressing ?  ?Problem: Activity: ?Goal: Interest or engagement in activities will improve ?Outcome: Not Progressing ?Goal: Sleeping patterns will improve ?Outcome: Not Progressing ?  ?Problem: Coping: ?Goal: Ability to verbalize frustrations and anger appropriately will improve ?Outcome: Not Progressing ?Goal: Ability to demonstrate self-control will improve ?Outcome: Not Progressing ?  ?Problem: Health Behavior/Discharge Planning: ?Goal: Identification of resources available to assist in meeting health care needs will improve ?Outcome: Not Progressing ?Goal: Compliance with treatment plan for underlying cause of condition will improve ?Outcome: Not Progressing ?  ?Problem: Physical Regulation: ?Goal: Ability to maintain clinical measurements within normal limits will improve ?Outcome: Not Progressing ?  ?Problem: Safety: ?Goal: Periods of time without injury will increase ?Outcome: Not Progressing ?  ?

## 2021-06-11 NOTE — Progress Notes (Signed)
Patient refused her hs meds. She said she did not want to take the Risperdal because she said it keeps her up all night. Earlier in the shift during visiting hours, she asked if "they found the guy with a weapon" and she seemed to be under the impression that there was someone loose in the hospital with a weapon but there was not. She appeared very fearful and anxious and said she thought she heard someone say that. Denies SI

## 2021-06-11 NOTE — Progress Notes (Signed)
Recreation Therapy Notes  Date: 06/11/2021  Time: 10:30 am   Location: Craft room    Behavioral response: Appropriate   Intervention Topic: Time-management    Discussion/Intervention:  Group content today was focused on time management. The group defined time management and identified healthy ways to manage time. Individuals expressed how much of the 24 hours they use in a day. Patients expressed how much time they use just for themselves personally. The group expressed how they have managed their time in the past. Individuals participated in the intervention Managing Life where they had a chance to see how much of the 24 hours they use and where it goes.  Clinical Observations/Feedback: Patient came to group and was focused on the topic at hand. Participant was social with peers and staff while participating in the intervention.  Tonya Watkins LRT/CTRS           Tonya Watkins 06/11/2021 11:43 AM

## 2021-06-12 MED ORDER — HALOPERIDOL LACTATE 5 MG/ML IJ SOLN: 5.0000 mg | Freq: Once | INTRAMUSCULAR | Status: DC

## 2021-06-12 MED ORDER — DIPHENHYDRAMINE HCL 50 MG/ML IJ SOLN: 50.0000 mg | Freq: Once | INTRAMUSCULAR | Status: DC

## 2021-06-12 NOTE — Plan of Care (Signed)
Pt remains paranoid and suspicious of staff intentions. Pt continues to decline medications. Emotional support given.  Problem: Education: Goal: Mental status will improve Outcome: Not Progressing   Problem: Activity: Pt does not interact with staff or peers unless approached.  Goal: Interest or engagement in activities will improve Outcome: Not Progressing

## 2021-06-12 NOTE — Progress Notes (Signed)
Pt presents preoccupied and paranoid. Pt refused medications tonight. Pt given education, still refused. Pt isolative to her room tonight. Pt being monitored Q 15 minutes for safety per unit protocol. Pt remains safe on the unit.

## 2021-06-12 NOTE — Plan of Care (Signed)
Patient has been pacing. Restless and anxious but remains denial. Frequently asking to be discharged. Patient appears to be preoccupied. Denying SI/HI. Continues to refuse medications. Guarded and resisting to care. Safety precautions reinforced.

## 2021-06-12 NOTE — Progress Notes (Addendum)
Recreation Therapy Notes ° °Date: 06/12/2021 ° °Time: 10:00am °  °Location: Courtyard   ° °Behavioral response: N/A °  °Intervention Topic: Leisure   ° °Discussion/Intervention: °Patient refused to attend group. °  °Clinical Observations/Feedback:  °Patient refused to attend group. °  °Sachin Ferencz LRT/CTRS ° ° ° ° ° ° ° ° ° °Theresea Trautmann °06/12/2021 11:53 AM °

## 2021-06-12 NOTE — Progress Notes (Signed)
Pt refused all her hs meds. Pt out in the milieu intermittently during early hours of the shift. Pt denies SI/HI/AVH. Pt suspicious when writer went to her room. Pt stated "I thought my door was locked, I saw the maintenance lady messing with the door". Interaction with patient minimal, as patient is suspicious and remains guarded. Q15 min safety checks maintained. Will continue to monitor.

## 2021-06-12 NOTE — Progress Notes (Signed)
[facility] MD Progress Note  06/12/2021 4:29 PM [place]  MRN:  [id number] Subjective: Follow-up for this 51 year old 1 with psychosis.  This afternoon patient became acutely agitated.  Screaming and crying.  Clearly delusional and hallucinating.  Tonya Watkins that she believed that someone had shot an arrow through her head.  Referred to one of the nurse technicians by a name that was incorrect and strange.  Patient was crying and shaking all over.  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 No pertinent surgical history. Family History:  Family History  Problem Relation Age of Onset   Allergies Mother    Cancer Mother        thyroid   Heart block Maternal Grandmother    Family Psychiatric  History: See previous Social History:  Social History   Substance and Sexual Activity  Alcohol Use Not Currently     Social History   Substance and Sexual Activity  Drug Use Not Currently    Social History   Socioeconomic History   Marital status: Divorced    Spouse name: Not on file   Number of children: 3   Years of education: Not on file   Highest education level: Not on file  Occupational History   Occupation: RN  Tobacco Use   Smoking status: Never   Smokeless tobacco: Never  Vaping Use   Vaping Use: Unknown  Substance and Sexual Activity   Alcohol use: Not Currently   Drug use: Not Currently   Sexual activity: Yes  Other Topics Concern    Not on file  Social History Narrative   Not on file   Social Determinants of Health   Financial Resource Strain: Not on file  Food Insecurity: Not on file  Transportation Needs: Not on file  Physical Activity: Not on file  Stress: Not on file  Social Connections: Not on file   Additional Social History:                         Sleep: Fair  Appetite:  Fair  Current Medications: Current Facility-Administered Medications  Medication Dose Route Frequency Provider Last Rate Last Admin   acetaminophen (TYLENOL) tablet 650 mg  650 mg Oral Q6H PRN Ardyn Forge T, MD       alum & mag hydroxide-simeth (MAALOX/MYLANTA) 200-200-20 MG/5ML suspension 30 mL  30 mL Oral Q4H PRN Eleyna Brugh T, MD       benztropine (COGENTIN) tablet 0.5 mg  0.5 mg Oral QHS Erianna Jolly T, MD       diphenhydrAMINE (BENADRYL) injection 50 mg  50 mg Intramuscular Once Qadir Folks T, MD       haloperidol lactate (HALDOL) injection 5 mg  5 mg Intramuscular Once Salim Forero, Madie Reno, MD       hydrOXYzine (ATARAX) tablet 50 mg  50 mg Oral TID PRN Alpheus Stiff, Madie Reno, MD   50 mg at 06/11/21 1140   magnesium hydroxide (MILK OF MAGNESIA)  suspension 30 mL  30 mL Oral Daily PRN Dacota Devall T, MD       mirtazapine (REMERON) tablet 15 mg  15 mg Oral QHS Taeler Winning T, MD       polyethylene glycol (MIRALAX / GLYCOLAX) packet 17 g  17 g Oral Daily PRN Rulon Sera, MD       risperiDONE (RISPERDAL M-TABS) disintegrating tablet 2 mg  2 mg Oral QHS Simpson Paulos, Madie Reno, MD   2 mg at 06/09/21 2117    Lab Results: No results found for this or any previous visit (from the past 48 hour(s)).  Blood Alcohol level:  Lab Results  Component Value Date   ETH <10 [id number]    Metabolic Disorder Labs: Lab Results  Component Value Date   HGBA1C 5.5 06/04/2021   MPG 111 06/04/2021   No results found for: PROLACTIN Lab Results  Component Value Date   CHOL 148 06/04/2021   TRIG 31 06/04/2021   HDL 71 06/04/2021   CHOLHDL  2.1 06/04/2021   VLDL 6 06/04/2021   LDLCALC 71 06/04/2021   LDLCALC 76 05/24/2021    Physical Findings: AIMS:  , ,  ,  ,    CIWA:    COWS:     Musculoskeletal: Strength & Muscle Tone: within normal limits Gait & Station: normal Patient leans: N/A  Psychiatric Specialty Exam:  Presentation  General Appearance: Appropriate for Environment  Eye Contact:Fair  Speech:Blocked  Speech Volume:Normal  Handedness:No data recorded  Mood and Affect  Mood:Dysphoric  Affect:Blunt   Thought Process  Thought Processes:Disorganized  Descriptions of Associations:Loose  Orientation:Full (Time, Place and Person)  Thought Content:Illusions  History of Schizophrenia/Schizoaffective disorder:No  Duration of Psychotic Symptoms:Less than six months  Hallucinations:No data recorded Ideas of Reference:-- (unable to assess)  Suicidal Thoughts:No data recorded Homicidal Thoughts:No data recorded  Sensorium  Memory:Immediate Poor  Judgment:Poor  Insight:Poor   Executive Functions  Concentration:Poor  Attention Span:Poor  [place]   Psychomotor Activity  Psychomotor Activity:No data recorded  Assets  Assets:Desire for Improvement; Financial Resources/Insurance; Housing; Resilience; Social Support; Physical Health   Sleep  Sleep:No data recorded   Physical Exam: Physical Exam Constitutional:      Appearance: Normal appearance.  HENT:     Head: Normocephalic and atraumatic.     Mouth/Throat:     Pharynx: Oropharynx is clear.  Eyes:     Pupils: Pupils are equal, round, and reactive to light.  Cardiovascular:     Rate and Rhythm: Normal rate and regular rhythm.  Pulmonary:     Effort: Pulmonary effort is normal.     Breath sounds: Normal breath sounds.  Abdominal:     General: Abdomen is flat.     Palpations: Abdomen is soft.  Musculoskeletal:        General: Normal range of motion.  Skin:    General:  Skin is warm and dry.  Neurological:     General: No focal deficit present.     Mental Status: She is alert. Mental status is at baseline.  Psychiatric:        Attention and Perception: She is inattentive.        Mood and Affect: Mood is anxious. Affect is labile.        Speech: She is noncommunicative. Speech is tangential.        Behavior: Behavior is agitated. Behavior is not aggressive.        Thought Content: Thought content is delusional.  Cognition and Memory: Cognition is impaired.   Review of Systems  Constitutional: Negative.   HENT: Negative.    Eyes: Negative.   Respiratory: Negative.    Cardiovascular: Negative.   Gastrointestinal: Negative.   Musculoskeletal: Negative.   Skin: Negative.   Neurological: Negative.   Psychiatric/Behavioral:  Positive for hallucinations. Negative for depression, substance abuse and suicidal ideas. The patient is nervous/anxious. The patient does not have insomnia.   Blood pressure 121/66, pulse 78, temperature 98.2 F (36.8 C), temperature source Oral, resp. rate 17, height 5\' 3"  (1.6 m), weight 68 kg, SpO2 100 %. Body mass index is 26.56 kg/m.   Treatment Plan Summary: Medication management and Plan patient was clearly in distress from psychosis today.  Unable to cooperate with appropriate treatment.  Nevertheless refuse medications and is consistently refusing oral medicine.  If she continues to refuse nighttime medicine tonight we will look into initiating forced medicine plan for tomorrow  Alethia Berthold, MD 06/12/2021, 4:29 PM

## 2021-06-12 NOTE — Progress Notes (Signed)
Patient continues to refuse medications but has been calm and feel asleep.

## 2021-06-12 NOTE — Group Note (Signed)
[facility] LCSW Group Therapy Note ° ° °Group Date: 06/12/2021 °Start Time: 1330 °End Time: 1430 ° °Type of Therapy/Topic:  Group Therapy:  Feelings about Diagnosis ° °Participation Level:  Did Not Attend  ° ° °Description of Group:   ° This group will allow patients to explore their thoughts and feelings about diagnoses they have received. Patients will be guided to explore their level of understanding and acceptance of these diagnoses. Facilitator will encourage patients to process their thoughts and feelings about the reactions of others to their diagnosis, and will guide patients in identifying ways to discuss their diagnosis with significant others in their lives. This group will be process-oriented, with patients participating in exploration of their own experiences as well as giving and receiving support and challenge from other group members. ° ° °Therapeutic Goals: °1. Patient will demonstrate understanding of diagnosis as evidence by identifying two or more symptoms of the disorder:  °2. Patient will be able to express two feelings regarding the diagnosis °3. Patient will demonstrate ability to communicate their needs through discussion and/or role plays ° °Summary of Patient Progress: ° ° ° °X ° ° ° °Therapeutic Modalities:   °Cognitive Behavioral Therapy °Brief Therapy °Feelings Identification  ° ° °Shellye Zandi J Ej Pinson, LCSW °

## 2021-06-13 DIAGNOSIS — F323 Major depressive disorder, single episode, severe with psychotic features: Secondary | ICD-10-CM | POA: Diagnosis present

## 2021-06-13 MED ORDER — LORAZEPAM 2 MG/ML IJ SOLN: 1.0000 mg | INTRAMUSCULAR | Status: DC | PRN

## 2021-06-13 MED ORDER — OLANZAPINE 5 MG PO TABS: 5.0000 mg | ORAL_TABLET | Freq: Two times a day (BID) | ORAL | Status: DC | PRN

## 2021-06-13 MED ORDER — RISPERIDONE 1 MG PO TBDP
1.0000 mg | ORAL_TABLET | Freq: Two times a day (BID) | ORAL | Status: DC
  Administered 2021-06-13 – 2021-06-19 (×12): 1 mg via ORAL
  Filled 2021-06-13 (×12): qty 1

## 2021-06-13 MED ORDER — LORAZEPAM 1 MG PO TABS: 1.0000 mg | ORAL_TABLET | ORAL | Status: DC | PRN

## 2021-06-13 MED ORDER — OLANZAPINE 5 MG PO TABS: 5.0000 mg | ORAL_TABLET | Freq: Every day | ORAL | Status: DC

## 2021-06-13 MED ORDER — VALACYCLOVIR HCL 500 MG PO TABS: 500.0000 mg | ORAL_TABLET | Freq: Every day | ORAL | Status: DC

## 2021-06-13 MED ORDER — HALOPERIDOL LACTATE 5 MG/ML IJ SOLN: 2.0000 mg | Freq: Two times a day (BID) | INTRAMUSCULAR | Status: DC | PRN

## 2021-06-13 MED ORDER — HYDROXYZINE HCL 25 MG PO TABS: 25.0000 mg | ORAL_TABLET | Freq: Three times a day (TID) | ORAL | Status: DC | PRN

## 2021-06-13 NOTE — [facility] Counselor (Signed)
Patient approached the nurses station asking for CSW to "get the security guard to walk me out. I am ready to go outside".   CSW explained that it's raining at the time so unlikely to go outside.  CSW explained that an announcement would be made over system loudspeaker announcing if patient's would be allowed outside.    CSW explained that at this time the patient is not being discharged from the unit as patient appeared confused at statement that she would not be allowed outside at this time.   Penni Homans, MSW, LCSW 06/13/2021 9:36 AM

## 2021-06-13 NOTE — Group Note (Signed)
[facility] LCSW Group Therapy Note   Group Date: 06/13/2021 Start Time: 1300 End Time: 1400   Type of Therapy/Topic:  Group Therapy:  Emotion Regulation  Participation Level:  Did Not Attend   Mood:  Description of Group:    The purpose of this group is to assist patients in learning to regulate negative emotions and experience positive emotions. Patients will be guided to discuss ways in which they have been vulnerable to their negative emotions. These vulnerabilities will be juxtaposed with experiences of positive emotions or situations, and patients challenged to use positive emotions to combat negative ones. Special emphasis will be placed on coping with negative emotions in conflict situations, and patients will process healthy conflict resolution skills.  Therapeutic Goals: Patient will identify two positive emotions or experiences to reflect on in order to balance out negative emotions:  Patient will label two or more emotions that they find the most difficult to experience:  Patient will be able to demonstrate positive conflict resolution skills through discussion or role plays:   Summary of Patient Progress:   X    Therapeutic Modalities:   Cognitive Behavioral Therapy Feelings Identification Dialectical Behavioral Therapy   Rozann Lesches, LCSW

## 2021-06-13 NOTE — BH IP Treatment Plan (Signed)
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[place]) Active Problems:   Palpitations   PVC's (premature ventricular contractions)   Current Medications:  Current Facility-Administered Medications  Medication Dose Route Frequency Provider Last Rate Last Admin   acetaminophen (TYLENOL) tablet 650 mg  650 mg Oral Q6H PRN Clapacs, John T, MD       alum & mag hydroxide-simeth (MAALOX/MYLANTA) 200-200-20 MG/5ML suspension 30 mL  30 mL Oral Q4H PRN Clapacs, John T, MD       benztropine (COGENTIN) tablet 0.5 mg  0.5 mg Oral QHS Clapacs, John T, MD       diphenhydrAMINE (BENADRYL) injection 50 mg  50 mg Intramuscular Once Clapacs, Madie Reno, MD       haloperidol lactate (HALDOL) injection 5 mg  5 mg Intramuscular Once Clapacs, Madie Reno, MD       hydrOXYzine (ATARAX) tablet 50 mg  50 mg Oral TID PRN Clapacs, Madie Reno, MD   50 mg at 06/11/21 1140   magnesium hydroxide (MILK OF MAGNESIA) suspension 30 mL  30 mL Oral Daily PRN Clapacs, John T, MD       mirtazapine (REMERON) tablet 15 mg  15 mg Oral QHS Clapacs, John T, MD       polyethylene glycol (MIRALAX / GLYCOLAX) packet 17 g  17 g Oral Daily PRN Rulon Sera, MD       risperiDONE (RISPERDAL M-TABS) disintegrating tablet 2 mg  2 mg Oral QHS Clapacs, John T, MD   2 mg at 06/09/21 2117   PTA Medications: Medications Prior to Admission  Medication Sig Dispense Refill Last Dose   buPROPion (WELLBUTRIN XL) 300 MG 24 hr tablet Take 1 tablet by mouth once daily 30 tablet 5     Patient Stressors: Marital or family conflict    Patient Strengths: Supportive family/friends   Treatment Modalities: Medication Management, Group therapy, Case management,  1 to 1 session with clinician, Psychoeducation, Recreational therapy.   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  Therapeutic Interventions: 1 to 1 sessions, Unit Group sessions and Medication administration.  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  Therapeutic Interventions: 1 to 1 sessions, Unit Group sessions and Medication administration.  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[place]) Long Term Goal(s): Knowledge of disease and therapeutic regimen to maintain health will improve  Short Term Goals: Ability to demonstrate self-control,  Ability to participate in decision making will improve, Ability to verbalize feelings will improve, Ability to disclose and discuss suicidal ideas, Ability to identify and develop effective coping behaviors will improve, and Compliance with prescribed medications will  improve  Medication Management: RN will administer medications as ordered by provider, will assess and evaluate patient's response and provide education to patient for prescribed medication. RN will report any adverse and/or side effects to prescribing provider.  Therapeutic Interventions: 1 on 1 counseling sessions, Psychoeducation, Medication administration, Evaluate responses to treatment, Monitor vital signs and CBGs as ordered, Perform/monitor CIWA, COWS, AIMS and Fall Risk screenings as ordered, Perform wound care treatments as ordered.  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  Short Term Goals: Engage patient in aftercare planning with referrals and resources, Increase social support, Increase ability to appropriately verbalize feelings, Increase emotional regulation, Facilitate acceptance of mental health diagnosis and concerns, and Increase skills for wellness and recovery  Therapeutic Interventions: Assess for all discharge needs, 1 to 1 time with Social worker, Explore available resources and support systems, Assess for adequacy in community support network, Educate family and significant other(s) on suicide prevention, Complete Psychosocial Assessment, Interpersonal group therapy.  Evaluation of Outcomes: Not Progressing   Progress in Treatment: Attending groups: No. Participating in groups: No. Taking medication as prescribed: No. Toleration medication: No. Family/Significant other contact made: No, will contact:  once permission is given Patient understands diagnosis: No. Discussing patient identified problems/goals with staff: Yes. Medical problems stabilized or resolved: Yes. Denies suicidal/homicidal ideation: Yes. Issues/concerns per patient  self-inventory: No. Other: none  New problem(s) identified: No, Describe:  none  New Short Term/Long Term Goal(s): elimination of symptoms of psychosis, medication management for mood stabilization; elimination of SI thoughts; development of comprehensive mental wellness/sobriety plan. Update 06/13/2021:  No changes at this time.    Patient Goals:  "just get better" Update 06/13/2021:  No changes at this time.    Discharge Plan or Barriers: CSW will assist patient in developing appropriate discharge plans. Pt reports that she will return home and is open to a referral for aftercare plans. Update 06/13/2021:  Patient remains psychotic at this time.  She has declined medications and has decompensated while on the unit. Patient will likely need to have forced medications to increase possibility of increasing wellness.    Reason for Continuation of Hospitalization: Anxiety Depression Hallucinations Medication stabilization   Estimated Length of Stay:  1-7 days Update 06/13/2021:  TBD      Scribe for Treatment Team: Rozann Lesches, LCSW 06/13/2021 11:49 AM

## 2021-06-13 NOTE — Progress Notes (Signed)
[facility] Second Physician Opinion Progress Note for Medication Administration to Non-consenting Patients (For Involuntarily Committed Patients)  Patient: Tonya Watkins Date of Birth: [id number] MRN: [id number]  Reason for the Medication: The patient, without the benefit of the specific treatment measure, is incapable of participating in any available treatment plan that will give the patient a realistic opportunity of improving the patient's condition. There is, without the benefit of the specific treatment measure, a significant possibility that the patient will harm self or others before improvement of the patient's condition is realized.  Consideration of Side Effects: Consideration of the side effects related to the medication plan has been given.  Rationale for Medication Administration: Patient has very little insight into the severity of her illness and will not get better without medication.    [place], DO 06/13/21  10:49 AM   This documentation is good for (7) seven days from the date of the MD signature. New documentation must be completed every seven (7) days with detailed justification in the medical record if the patient requires continued non-emergent administration of psychotropic medications.

## 2021-06-13 NOTE — Progress Notes (Signed)
[facility] MD Progress Note  06/13/2021 5:01 PM Tonya Watkins  MRN:  [id number] Subjective: Follow-up for this patient who is presenting with psychotic symptoms and poor insight and poor cooperation.  Patient refused ordered medication both acutely and the standard once last night.  Found her today once again sitting in her room flat affect staring straight ahead speaking very slowly.  Minimizing symptoms.  At the same time talking about how her thoughts are very confusing to her.  No self-harm attempts but very poor self-care. Principal Problem: Brief reactive psychosis (HCC) Diagnosis: Principal Problem:   Brief reactive psychosis (HCC) Active Problems:   Palpitations   PVC's (premature ventricular contractions)   Major depressive disorder, single episode, severe with psychotic features (HCC)  Total Time spent with patient: 30 minutes  Past Psychiatric History: See previous note  Past Medical History:  Past Medical History:  Diagnosis Date   DVT (deep venous thrombosis) (HCC)    left calf   Heart palpitations    Medical history non-contributory    History reviewed. No pertinent surgical history. Family History:  Family History  Problem Relation Age of Onset   Allergies Mother    Cancer Mother        thyroid   Heart block Maternal Grandmother    Family Psychiatric  History: See previous Social History:  Social History   Substance and Sexual Activity  Alcohol Use Not Currently     Social History   Substance and Sexual Activity  Drug Use Not Currently    Social History   Socioeconomic History   Marital status: Divorced    Spouse name: Not on file   Number of children: 3   Years of education: Not on file   Highest education level: Not on file  Occupational History   Occupation: RN  Tobacco Use   Smoking status: Never   Smokeless tobacco: Never  Vaping Use   Vaping Use: Unknown  Substance and Sexual Activity   Alcohol use: Not Currently   Drug use: Not Currently    Sexual activity: Yes  Other Topics Concern   Not on file  Social History Narrative   Not on file   Social Determinants of Health   Financial Resource Strain: Not on file  Food Insecurity: Not on file  Transportation Needs: Not on file  Physical Activity: Not on file  Stress: Not on file  Social Connections: Not on file   Additional Social History:                         Sleep: Poor  Appetite:  Poor  Current Medications: Current Facility-Administered Medications  Medication Dose Route Frequency Provider Last Rate Last Admin   acetaminophen (TYLENOL) tablet 650 mg  650 mg Oral Q6H PRN Louis Gaw T, MD       alum & mag hydroxide-simeth (MAALOX/MYLANTA) 200-200-20 MG/5ML suspension 30 mL  30 mL Oral Q4H PRN Garrin Kirwan T, MD       benztropine (COGENTIN) tablet 0.5 mg  0.5 mg Oral QHS Toriann Spadoni T, MD       diphenhydrAMINE (BENADRYL) injection 50 mg  50 mg Intramuscular Once Sumeet Geter T, MD       haloperidol lactate (HALDOL) injection 2 mg  2 mg Intramuscular BID PRN Donica Derouin T, MD       haloperidol lactate (HALDOL) injection 5 mg  5 mg Intramuscular Once Mairyn Lenahan, Jackquline Denmark, MD       hydrOXYzine (ATARAX)  tablet 25 mg  25 mg Oral TID PRN Sherlon Handing, NP       hydrOXYzine (ATARAX) tablet 50 mg  50 mg Oral TID PRN Fischer Halley, Madie Reno, MD   50 mg at 06/11/21 1140   LORazepam (ATIVAN) tablet 1 mg  1 mg Oral Q4H PRN Sherlon Handing, NP       Or   LORazepam (ATIVAN) injection 1 mg  1 mg Intramuscular Q4H PRN Sherlon Handing, NP       magnesium hydroxide (MILK OF MAGNESIA) suspension 30 mL  30 mL Oral Daily PRN Kenadie Royce, Madie Reno, MD       mirtazapine (REMERON) tablet 15 mg  15 mg Oral QHS Ayane Delancey T, MD       OLANZapine (ZYPREXA) tablet 5 mg  5 mg Oral BID PRN Waldon Merl F, NP       polyethylene glycol (MIRALAX / GLYCOLAX) packet 17 g  17 g Oral Daily PRN Rulon Sera, MD       risperiDONE (RISPERDAL M-TABS) disintegrating tablet 1 mg  1 mg Oral  BID Lilyanah Celestin T, MD        Lab Results: No results found for this or any previous visit (from the past 48 hour(s)).  Blood Alcohol level:  Lab Results  Component Value Date   ETH <10 [id number]    Metabolic Disorder Labs: Lab Results  Component Value Date   HGBA1C 5.5 06/04/2021   MPG 111 06/04/2021   No results found for: PROLACTIN Lab Results  Component Value Date   CHOL 148 06/04/2021   TRIG 31 06/04/2021   HDL 71 06/04/2021   CHOLHDL 2.1 06/04/2021   VLDL 6 06/04/2021   LDLCALC 71 06/04/2021   LDLCALC 76 05/24/2021    Physical Findings: AIMS:  , ,  ,  ,    CIWA:    COWS:     Musculoskeletal: Strength & Muscle Tone: within normal limits Gait & Station: normal Patient leans: N/A  Psychiatric Specialty Exam:  Presentation  General Appearance: Appropriate for Environment  Eye Contact:Fair  Speech:Blocked  Speech Volume:Normal  Handedness:No data recorded  Mood and Affect  Mood:Dysphoric  Affect:Blunt   Thought Process  Thought Processes:Disorganized  Descriptions of Associations:Loose  Orientation:Full (Time, Place and Person)  Thought Content:Illusions  History of Schizophrenia/Schizoaffective disorder:No  Duration of Psychotic Symptoms:Less than six months  Hallucinations:No data recorded Ideas of Reference:-- (unable to assess)  Suicidal Thoughts:No data recorded Homicidal Thoughts:No data recorded  Sensorium  Memory:Immediate Poor  Judgment:Poor  Insight:Poor   Executive Functions  Concentration:Poor  Attention Span:Poor  [place]   Psychomotor Activity  Psychomotor Activity:No data recorded  Assets  Assets:Desire for Improvement; Financial Resources/Insurance; Housing; Resilience; Social Support; Physical Health   Sleep  Sleep:No data recorded   Physical Exam: Physical Exam Vitals and nursing note reviewed.  Constitutional:      Appearance: Normal  appearance.  HENT:     Head: Normocephalic and atraumatic.     Mouth/Throat:     Pharynx: Oropharynx is clear.  Eyes:     Pupils: Pupils are equal, round, and reactive to light.  Cardiovascular:     Rate and Rhythm: Normal rate and regular rhythm.  Pulmonary:     Effort: Pulmonary effort is normal.     Breath sounds: Normal breath sounds.  Abdominal:     General: Abdomen is flat.     Palpations: Abdomen is soft.  Musculoskeletal:  General: Normal range of motion.  Skin:    General: Skin is warm and dry.  Neurological:     General: No focal deficit present.     Mental Status: She is alert. Mental status is at baseline.  Psychiatric:        Attention and Perception: She is inattentive.        Mood and Affect: Mood normal. Affect is blunt.        Speech: Speech is delayed.        Behavior: Behavior is slowed.        Thought Content: Thought content is paranoid.        Cognition and Memory: Cognition is impaired.   Review of Systems  Constitutional: Negative.   HENT: Negative.    Eyes: Negative.   Respiratory: Negative.    Cardiovascular: Negative.   Gastrointestinal: Negative.   Musculoskeletal: Negative.   Skin: Negative.   Neurological: Negative.   Psychiatric/Behavioral:  The patient is nervous/anxious.   Blood pressure 131/73, pulse 73, temperature 98.4 F (36.9 C), temperature source Oral, resp. rate 18, height 5\' 3"  (1.6 m), weight 68 kg, SpO2 100 %. Body mass index is 26.56 kg/m.   Treatment Plan Summary: Medication management and Plan patient advised that because of her repeated refusals to cooperate with medicine and escalating symptomatology that forced medicines were indicated.  Explained that she would need to take her oral Risperdal or haloperidol would be administered IM.  Explained that this is being done because her illness is not improving and her insight is poor.  Alethia Berthold, MD 06/13/2021, 5:01 PM

## 2021-06-13 NOTE — Progress Notes (Signed)
Recreation Therapy Notes  Date: 06/13/2021  Time: 10:35 am   Location: Craft room    Behavioral response: N/A   Intervention Topic: Stress Management    Discussion/Intervention: Patient refused to attend group.   Clinical Observations/Feedback:  Patient refused to attend group.   Luchiano Viscomi LRT/CTRS        Earlyne Feeser 06/13/2021 12:51 PM

## 2021-06-13 NOTE — BHH Counselor (Signed)
CSW received call from the patient's mother.  Mother wanted an update on patient's care.   CSW explained that though mother has a code for the patient it does not allow CSW to speak with mother and provide updates at this time.  CSW explained that the code allows patient's mother to call and speak to patient, however, verbal and or written permission must be obtained from the patient to allow CSW to speak with mother.  At this time the patient continues to declined communication with family or collaterals.  Mother reports that she will pursue legal action.  Mother reports that she is caring for the patients children and patient will be released to her and she needs to know what is going on with the patient.   CSW again reiterated policy and apologized fr the frustration.   CSW reported that she will pass the message along to psychiatrist so he is aware.  CSW also reported that she would again speak with the patient for possible permission to speak with mother.   Tonya Watkins, MSW, LCSW 06/13/2021 9:34 AM

## 2021-06-13 NOTE — Progress Notes (Signed)
°   06/13/21 1600  Clinical Encounter Type  Visited With Patient  Visit Type Initial  Referral From Chaplain;Nurse  Consult/Referral To Chaplain   Chaplain Addyson Traub responded to nurse consult. Patient was teary and sad. Chaplain provided reflective listening and compassionate presence. Chaplain also provided a prayer book and positive affirmation sheets as patient requested. Patient appreciated Chaplain visit.

## 2021-06-13 NOTE — Plan of Care (Signed)
D: Pt alert and oriented. Pt denies experiencing any anxiety/depression at this time. Pt denies experiencing any pain at this time. Pt denies experiencing any SI/HI, or AVH at this time.   Pt observed as anxious. Pt has approached the nurse's station multiple times asking about discharge and where her discharge paperwork was. This evening the pt stated she needed to go downstairs to the nurse's station to her nurse that, that was where her discharge papers were. It was explained that she was already downstairs and she was at the nurse's station. This Clinical research associate then reintroduced herself as her nurse and explained once more the MD has not said anything about her discharging today. This Clinical research associate then asked the patient to come to the medication room and educated her on her medication that needed to be administered and the importance of taking it in regards to her health.   A: Scheduled medications administered to pt, per MD orders. Support and encouragement provided. Frequent verbal contact made. Routine safety checks conducted q15 minutes.   R: No adverse drug reactions noted. Pt verbally contracts for safety at this time. Pt complaint with medications. Pt interacts minimally with others on the unit. Pt remains safe at this time. Will continue to monitor.   Problem: Education: Goal: Emotional status will improve Outcome: Not Progressing Goal: Mental status will improve Outcome: Not Progressing

## 2021-06-14 NOTE — Plan of Care (Signed)
°  Problem: Education: Pt will continue to remain alert and oriented to situation at all times.  Goal: Mental status will improve Outcome: Progressing

## 2021-06-14 NOTE — Progress Notes (Signed)
[facility] MD Progress Note  06/14/2021 2:28 PM Tonya Watkins  MRN:  [id number] Subjective: Follow-up for 51 year old woman presented with idiosyncratic thought disorder and anxiety and depression.  Patient seen and chart reviewed.  Patient is now taking her medicine after forced medicines were initiated.  No sign of any side effects from it.  Still talking about feeling like she is "receiving information" and having trouble controlling her thoughts.  Seems to be a little bit more interactive.  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[place]) Diagnosis: Principal Problem:   Brief reactive psychosis (HCC) Active Problems:   Palpitations   PVC's (premature ventricular contractions)   Major depressive disorder, single episode, severe with psychotic features ([place])  Total Time spent with patient: 30 minutes  Past Psychiatric History: Past history of a hospitalization here just recently but prior to that no past history from years ago  Past Medical History:  Past Medical History:  Diagnosis Date   DVT (deep venous thrombosis) (HCC)    left calf   Heart palpitations    Medical history non-contributory    History reviewed. No pertinent surgical history. Family History:  Family History  Problem Relation Age of Onset   Allergies Mother    Cancer Mother        thyroid   Heart block Maternal Grandmother    Family Psychiatric  History: None known Social History:  Social History   Substance and Sexual Activity  Alcohol Use Not Currently     Social History   Substance and Sexual Activity  Drug Use Not Currently    Social History   Socioeconomic History   Marital status: Divorced    Spouse name: Not on file   Number of children: 3   Years of education: Not on file   Highest education level: Not on file  Occupational History   Occupation: RN  Tobacco Use   Smoking status: Never   Smokeless tobacco: Never  Vaping Use   Vaping Use: Unknown  Substance and Sexual  Activity   Alcohol use: Not Currently   Drug use: Not Currently   Sexual activity: Yes  Other Topics Concern   Not on file  Social History Narrative   Not on file   Social Determinants of Health   Financial Resource Strain: Not on file  Food Insecurity: Not on file  Transportation Needs: Not on file  Physical Activity: Not on file  Stress: Not on file  Social Connections: Not on file   Additional Social History:                         Sleep: Fair  Appetite:  Fair  Current Medications: Current Facility-Administered Medications  Medication Dose Route Frequency Provider Last Rate Last Admin   acetaminophen (TYLENOL) tablet 650 mg  650 mg Oral Q6H PRN Enjoli Tidd, Madie Reno, MD       alum & mag hydroxide-simeth (MAALOX/MYLANTA) 200-200-20 MG/5ML suspension 30 mL  30 mL Oral Q4H PRN Markail Diekman T, MD       benztropine (COGENTIN) tablet 0.5 mg  0.5 mg Oral QHS Vue Pavon T, MD   0.5 mg at 06/13/21 2126   diphenhydrAMINE (BENADRYL) injection 50 mg  50 mg Intramuscular Once Frutoso Dimare T, MD       haloperidol lactate (HALDOL) injection 2 mg  2 mg Intramuscular BID PRN Adaliah Hiegel T, MD       haloperidol lactate (HALDOL) injection 5 mg  5 mg  Intramuscular Once Chlora Mcbain, Madie Reno, MD       hydrOXYzine (ATARAX) tablet 25 mg  25 mg Oral TID PRN Sherlon Handing, NP       hydrOXYzine (ATARAX) tablet 50 mg  50 mg Oral TID PRN Laurent Cargile, Madie Reno, MD   50 mg at 06/13/21 1714   LORazepam (ATIVAN) tablet 1 mg  1 mg Oral Q4H PRN Sherlon Handing, NP       Or   LORazepam (ATIVAN) injection 1 mg  1 mg Intramuscular Q4H PRN Waldon Merl F, NP       magnesium hydroxide (MILK OF MAGNESIA) suspension 30 mL  30 mL Oral Daily PRN Andretta Ergle T, MD       mirtazapine (REMERON) tablet 15 mg  15 mg Oral QHS Kellsie Grindle T, MD   15 mg at 06/13/21 2126   OLANZapine (ZYPREXA) tablet 5 mg  5 mg Oral BID PRN Waldon Merl F, NP       polyethylene glycol (MIRALAX / GLYCOLAX) packet 17 g  17  g Oral Daily PRN Rulon Sera, MD       risperiDONE (RISPERDAL M-TABS) disintegrating tablet 1 mg  1 mg Oral BID Maliq Pilley, Madie Reno, MD   1 mg at 06/14/21 [id number]    Lab Results: No results found for this or any previous visit (from the past 48 hour(s)).  Blood Alcohol level:  Lab Results  Component Value Date   ETH <10 123456    Metabolic Disorder Labs: Lab Results  Component Value Date   HGBA1C 5.5 06/04/2021   MPG 111 06/04/2021   No results found for: PROLACTIN Lab Results  Component Value Date   CHOL 148 06/04/2021   TRIG 31 06/04/2021   HDL 71 06/04/2021   CHOLHDL 2.1 06/04/2021   VLDL 6 06/04/2021   LDLCALC 71 06/04/2021   LDLCALC 76 05/24/2021    Physical Findings: AIMS:  , ,  ,  ,    CIWA:    COWS:     Musculoskeletal: Strength & Muscle Tone: within normal limits Gait & Station: normal Patient leans: N/A  Psychiatric Specialty Exam:  Presentation  General Appearance: Appropriate for Environment  Eye Contact:Fair  Speech:Blocked  Speech Volume:Normal  Handedness:No data recorded  Mood and Affect  Mood:Dysphoric  Affect:Blunt   Thought Process  Thought Processes:Disorganized  Descriptions of Associations:Loose  Orientation:Full (Time, Place and Person)  Thought Content:Illusions  History of Schizophrenia/Schizoaffective disorder:No  Duration of Psychotic Symptoms:Less than six months  Hallucinations:No data recorded Ideas of Reference:-- (unable to assess)  Suicidal Thoughts:No data recorded Homicidal Thoughts:No data recorded  Sensorium  Memory:Immediate Poor  Judgment:Poor  Insight:Poor   Executive Functions  Concentration:Poor  Attention Span:Poor  [place]   Psychomotor Activity  Psychomotor Activity:No data recorded  Assets  Assets:Desire for Improvement; Financial Resources/Insurance; Housing; Resilience; Social Support; Physical Health   Sleep  Sleep:No data  recorded   Physical Exam: Physical Exam Vitals and nursing note reviewed.  Constitutional:      Appearance: Normal appearance.  HENT:     Head: Normocephalic and atraumatic.     Mouth/Throat:     Pharynx: Oropharynx is clear.  Eyes:     Pupils: Pupils are equal, round, and reactive to light.  Cardiovascular:     Rate and Rhythm: Normal rate and regular rhythm.  Pulmonary:     Effort: Pulmonary effort is normal.     Breath sounds: Normal breath sounds.  Abdominal:  General: Abdomen is flat.     Palpations: Abdomen is soft.  Musculoskeletal:        General: Normal range of motion.  Skin:    General: Skin is warm and dry.  Neurological:     General: No focal deficit present.     Mental Status: She is alert. Mental status is at baseline.  Psychiatric:        Attention and Perception: She is inattentive.        Mood and Affect: Mood normal. Affect is blunt.        Speech: Speech is delayed.        Behavior: Behavior is slowed.        Thought Content: Thought content is paranoid.        Cognition and Memory: Cognition is impaired. Memory is impaired.   Review of Systems  Constitutional: Negative.   HENT: Negative.    Eyes: Negative.   Respiratory: Negative.    Cardiovascular: Negative.   Gastrointestinal: Negative.   Musculoskeletal: Negative.   Skin: Negative.   Neurological: Negative.   Psychiatric/Behavioral:  The patient is nervous/anxious.   Blood pressure 131/76, pulse (!) 59, temperature 98.4 F (36.9 C), temperature source Oral, resp. rate 17, height 5\' 3"  (1.6 m), weight 68 kg, SpO2 100 %. Body mass index is 26.56 kg/m.   Treatment Plan Summary: Medication management and Plan supportive counseling to the patient.  Trying to build rapport.  Encouraging her to talk with staff about the specific things on her mind that are worrying her.  Continue current medication.  Probably too early to see significant change from antipsychotic.  Alethia Berthold,  MD 06/14/2021, 2:28 PM

## 2021-06-14 NOTE — [facility] Suicide Risk Assessment (Signed)
[facility] INPATIENT:  Family/Significant Other Suicide Prevention Education  Suicide Prevention Education:  Education Completed; Prentice Docker, mother, [phone] has been identified by the patient as the family member/significant other with whom the patient will be residing, and identified as the person(s) who will aid the patient in the event of a mental health crisis (suicidal ideations/suicide attempt).  With written consent from the patient, the family member/significant other has been provided the following suicide prevention education, prior to the and/or following the discharge of the patient.  The suicide prevention education provided includes the following: Suicide risk factors Suicide prevention and interventions National Suicide Hotline telephone number [facility] assessment telephone number [facility] Emergency Assistance [place] and/or Residential Mobile Crisis Unit telephone number  Request made of family/significant other to: Remove weapons (e.g., guns, rifles, knives), all items previously/currently identified as safety concern.   Remove drugs/medications (over-the-counter, prescriptions, illicit drugs), all items previously/currently identified as a safety concern.  The family member/significant other verbalizes understanding of the suicide prevention education information provided.  The family member/significant other agrees to remove the items of safety concern listed above.  Mother reports that she is looking into becoming the patient's Power of Attorney to address the patient's bills and care for the patient's children. Mother reports that the patient "had been reading this book about chakra, and a portal and listening to this voice all night long".  Mother reports that the patient has been disorganized since then.   Rozann Lesches 06/14/2021, 1:46 PM

## 2021-06-14 NOTE — Progress Notes (Signed)
Patient was restless throughout the shift. She fell asleep toward dinner time and requested to reschedule her Risperidone for bedtime. She continues to report that "too much going on in my mind". Currently in bed sleeping. Safety precautions maintained.

## 2021-06-14 NOTE — Plan of Care (Signed)
Continues to isolate in room reporting that "there is a lot coming to my mind... I need to be alone and think...". Patient is now taking medications. She appears to be preoccupied, guarded and irritable. Pt ate breakfast and returned to her room. Was encouraged to express her concerns as needed.

## 2021-06-14 NOTE — Progress Notes (Signed)
Pt visible in the milieu at the beginning of the shift. Pt had a pensive look on her face, and came up to me asking " is my mom here tonight? is the paperwork at the court signed?Then she added "ok, ok, I understand, and she walked away.  Affect and mood is depressed with a worried look on her face. The patient paced around a few times and returned to her room.  The patient was cooperative during med pass and took all her scheduled hs meds. Patient denies SI/HI/AVH.  Patient reported that she feels "very tired", but does not know why. Emotional support given. The patient went to bed early. Q15 min safety checks maintained.

## 2021-06-14 NOTE — Group Note (Signed)
LCSW Group Therapy Note  Group Date: 06/14/2021 Start Time: 1300 End Time: 1400   Type of Therapy and Topic:  Group Therapy - How To Cope with Nervousness about Discharge   Participation Level:  Did Not Attend   Description of Group This process group involved identification of patients' feelings about discharge. Some of them are scheduled to be discharged soon, while others are new admissions, but each of them was asked to share thoughts and feelings surrounding discharge from the hospital. One common theme was that they are excited at the prospect of going home, while another was that many of them are apprehensive about sharing why they were hospitalized. Patients were given the opportunity to discuss these feelings with their peers in preparation for discharge.  Therapeutic Goals  Patient will identify their overall feelings about pending discharge. Patient will think about how they might proactively address issues that they believe will once again arise once they get home (i.e. with parents). Patients will participate in discussion about having hope for change.   Summary of Patient Progress: Patient did not attend group despite encouraged participation.     Therapeutic Modalities Cognitive Behavioral Therapy   Almedia Balls 06/14/2021  1:38 PM

## 2021-06-14 NOTE — Progress Notes (Signed)
Recreation Therapy Notes  Date: 06/14/2021  Time: 10:35 am   Location: Craft room    Behavioral response: Appropriate  Intervention Topic: Goals    Discussion/Intervention:  Group content on today was focused on goals. Patients described what goals are and how they define goals. Individuals expressed how they go about setting goals and reaching them. The group identified how important goals are and if they make short term goals to reach long term goals. Patients described how many goals they work on at a time and what affects them not reaching their goal. Individuals described how much time they put into planning and obtaining their goals. The group participated in the intervention My Goal Board and made personal goal boards to help them achieve their goal. Clinical Observations/Feedback: Patient came to group late due to unknow reasons. Individual was social with peers and staff while participating in the intervention.  Chance Karam LRT/CTRS         Levana Minetti 06/14/2021 1:03 PM

## 2021-06-15 MED ORDER — MIRTAZAPINE 15 MG PO TABS
30.0000 mg | ORAL_TABLET | Freq: Every day | ORAL | Status: DC
  Administered 2021-06-15 – 2021-06-18 (×4): 30 mg via ORAL
  Filled 2021-06-15 (×4): qty 2

## 2021-06-15 NOTE — Group Note (Signed)
[facility] LCSW Group Therapy Note ° ° °Group Date: 06/15/2021 °Start Time: 1300 °End Time: 1433 ° °Type of Therapy and Topic:  Group Therapy:  Feelings around Relapse and Recovery ° °Participation Level:  Did Not Attend  ° °Mood: ° °Description of Group:   ° Patients in this group will discuss emotions they experience before and after a relapse. They will process how experiencing these feelings, or avoidance of experiencing them, relates to having a relapse. Facilitator will guide patients to explore emotions they have related to recovery. Patients will be encouraged to process which emotions are more powerful. They will be guided to discuss the emotional reaction significant others in their lives may have to patients’ relapse or recovery. Patients will be assisted in exploring ways to respond to the emotions of others without this contributing to a relapse. ° °Therapeutic Goals: °Patient will identify two or more emotions that lead to relapse for them:  °Patient will identify two emotions that result when they relapse:  °Patient will identify two emotions related to recovery:  °Patient will demonstrate ability to communicate their needs through discussion and/or role plays. ° ° °Summary of Patient Progress: ° °Group not held due to staffing shortage.  ° ° °Therapeutic Modalities:   °Cognitive Behavioral Therapy °Solution-Focused Therapy °Assertiveness Training °Relapse Prevention Therapy ° ° °Kaleb Sek W Detrell Umscheid, LCSWA °

## 2021-06-15 NOTE — Progress Notes (Signed)
[facility] MD Progress Note  06/15/2021 3:01 PM Tonya Watkins  MRN:  [id number] Subjective: Patient seen for follow-up.  She is now taking the risperidone and has started on mirtazapine.  Patient reports she is feeling better today.  She is no longer talking about feeling that there is "information" flowing into her body.  She has been able to be out around peers and interacting more readily and does not appear to be as withdrawn as she was before.  She has not had any more episodes of gross confusion or delusions as she did a couple days ago.  No complaints of specific side effects Principal Problem: Brief reactive psychosis (HCC) Diagnosis: Principal Problem:   Brief reactive psychosis (HCC) Active Problems:   Palpitations   PVC's (premature ventricular contractions)   Major depressive disorder, single episode, severe with psychotic features ([place])  Total Time spent with patient: 30 minutes  Past Psychiatric History: Patient has been treated for depression in the past.  No previous hospitalizations no previous known psychosis.  Past Medical History:  Past Medical History:  Diagnosis Date   DVT (deep venous thrombosis) (HCC)    left calf   Heart palpitations    Medical history non-contributory    History reviewed. No pertinent surgical history. Family History:  Family History  Problem Relation Age of Onset   Allergies Mother    Cancer Mother        thyroid   Heart block Maternal Grandmother    Family Psychiatric  History: See previous Social History:  Social History   Substance and Sexual Activity  Alcohol Use Not Currently     Social History   Substance and Sexual Activity  Drug Use Not Currently    Social History   Socioeconomic History   Marital status: Divorced    Spouse name: Not on file   Number of children: 3   Years of education: Not on file   Highest education level: Not on file  Occupational History   Occupation: RN  Tobacco Use   Smoking status: Never    Smokeless tobacco: Never  Vaping Use   Vaping Use: Unknown  Substance and Sexual Activity   Alcohol use: Not Currently   Drug use: Not Currently   Sexual activity: Yes  Other Topics Concern   Not on file  Social History Narrative   Not on file   Social Determinants of Health   Financial Resource Strain: Not on file  Food Insecurity: Not on file  Transportation Needs: Not on file  Physical Activity: Not on file  Stress: Not on file  Social Connections: Not on file   Additional Social History:                         Sleep: Fair  Appetite:  Fair  Current Medications: Current Facility-Administered Medications  Medication Dose Route Frequency Provider Last Rate Last Admin   acetaminophen (TYLENOL) tablet 650 mg  650 mg Oral Q6H PRN Fiorella Hanahan T, MD       alum & mag hydroxide-simeth (MAALOX/MYLANTA) 200-200-20 MG/5ML suspension 30 mL  30 mL Oral Q4H PRN Damico Partin T, MD       benztropine (COGENTIN) tablet 0.5 mg  0.5 mg Oral QHS Tavonna Worthington T, MD   0.5 mg at 06/14/21 2122   diphenhydrAMINE (BENADRYL) injection 50 mg  50 mg Intramuscular Once Jess Toney T, MD       haloperidol lactate (HALDOL) injection 2 mg  2  mg Intramuscular BID PRN Brenden Rudman, Madie Reno, MD       haloperidol lactate (HALDOL) injection 5 mg  5 mg Intramuscular Once Monna Crean, Madie Reno, MD       hydrOXYzine (ATARAX) tablet 25 mg  25 mg Oral TID PRN Sherlon Handing, NP       hydrOXYzine (ATARAX) tablet 50 mg  50 mg Oral TID PRN Anijah Spohr, Madie Reno, MD   50 mg at 06/13/21 1714   LORazepam (ATIVAN) tablet 1 mg  1 mg Oral Q4H PRN Sherlon Handing, NP       Or   LORazepam (ATIVAN) injection 1 mg  1 mg Intramuscular Q4H PRN Waldon Merl F, NP       magnesium hydroxide (MILK OF MAGNESIA) suspension 30 mL  30 mL Oral Daily PRN Otilio Groleau T, MD       mirtazapine (REMERON) tablet 30 mg  30 mg Oral QHS Shaina Gullatt T, MD       OLANZapine (ZYPREXA) tablet 5 mg  5 mg Oral BID PRN Waldon Merl F, NP        polyethylene glycol (MIRALAX / GLYCOLAX) packet 17 g  17 g Oral Daily PRN Rulon Sera, MD       risperiDONE (RISPERDAL M-TABS) disintegrating tablet 1 mg  1 mg Oral BID Dewanda Fennema, Madie Reno, MD   1 mg at 06/15/21 0815    Lab Results: No results found for this or any previous visit (from the past 48 hour(s)).  Blood Alcohol level:  Lab Results  Component Value Date   ETH <10 123456    Metabolic Disorder Labs: Lab Results  Component Value Date   HGBA1C 5.5 06/04/2021   MPG 111 06/04/2021   No results found for: PROLACTIN Lab Results  Component Value Date   CHOL 148 06/04/2021   TRIG 31 06/04/2021   HDL 71 06/04/2021   CHOLHDL 2.1 06/04/2021   VLDL 6 06/04/2021   LDLCALC 71 06/04/2021   LDLCALC 76 05/24/2021    Physical Findings: AIMS:  , ,  ,  ,    CIWA:    COWS:     Musculoskeletal: Strength & Muscle Tone: within normal limits Gait & Station: normal Patient leans: N/A  Psychiatric Specialty Exam:  Presentation  General Appearance: Appropriate for Environment  Eye Contact:Fair  Speech:Blocked  Speech Volume:Normal  Handedness:No data recorded  Mood and Affect  Mood:Dysphoric  Affect:Blunt   Thought Process  Thought Processes:Disorganized  Descriptions of Associations:Loose  Orientation:Full (Time, Place and Person)  Thought Content:Illusions  History of Schizophrenia/Schizoaffective disorder:No  Duration of Psychotic Symptoms:Less than six months  Hallucinations:No data recorded Ideas of Reference:-- (unable to assess)  Suicidal Thoughts:No data recorded Homicidal Thoughts:No data recorded  Sensorium  Memory:Immediate Poor  Judgment:Poor  Insight:Poor   Executive Functions  Concentration:Poor  Attention Span:Poor  [place]   Psychomotor Activity  Psychomotor Activity:No data recorded  Assets  Assets:Desire for Improvement; Financial Resources/Insurance; Housing;  Resilience; Social Support; Physical Health   Sleep  Sleep:No data recorded   Physical Exam: Physical Exam Vitals and nursing note reviewed.  Constitutional:      Appearance: Normal appearance.  HENT:     Head: Normocephalic and atraumatic.     Mouth/Throat:     Pharynx: Oropharynx is clear.  Eyes:     Pupils: Pupils are equal, round, and reactive to light.  Cardiovascular:     Rate and Rhythm: Normal rate and regular rhythm.  Pulmonary:  Effort: Pulmonary effort is normal.     Breath sounds: Normal breath sounds.  Abdominal:     General: Abdomen is flat.     Palpations: Abdomen is soft.  Musculoskeletal:        General: Normal range of motion.  Skin:    General: Skin is warm and dry.  Neurological:     General: No focal deficit present.     Mental Status: She is alert. Mental status is at baseline.  Psychiatric:        Attention and Perception: Attention normal.        Mood and Affect: Mood is anxious.        Speech: Speech normal.        Behavior: Behavior is withdrawn. Behavior is cooperative.        Thought Content: Thought content normal.   Review of Systems  Constitutional: Negative.   HENT: Negative.    Eyes: Negative.   Respiratory: Negative.    Cardiovascular: Negative.   Gastrointestinal: Negative.   Musculoskeletal: Negative.   Skin: Negative.   Neurological: Negative.   Psychiatric/Behavioral: Negative.    Blood pressure 118/69, pulse 79, temperature 98 F (36.7 C), temperature source Oral, resp. rate 17, height 5\' 3"  (1.6 m), weight 68 kg, SpO2 100 %. Body mass index is 26.56 kg/m.   Treatment Plan Summary: Medication management and Plan increase mirtazapine to 30 mg for better antidepressant response.  Continue risperidone.  Psychoeducation provided to the patient.  Ongoing daily assessment.  Possible length of stay 2 to 3 days.  Alethia Berthold, MD 06/15/2021, 3:01 PM

## 2021-06-15 NOTE — Progress Notes (Signed)
Recreation Therapy Notes   Date: 06/15/2021  Time: 10:30am    Location: Craft room    Behavioral response: N/A   Intervention Topic: Social Skills    Discussion/Intervention: Patient refused to attend group.   Clinical Observations/Feedback:  Patient refused to attend group.   Amery Minasyan LRT/CTRS         Mia Winthrop 06/15/2021 11:27 AM

## 2021-06-15 NOTE — Progress Notes (Signed)
Patient denies pain. Patient denies anxiety and depression. Patient denies SI/HI/AVH.  Patient came up to nurses station asking about the medications that are scheduled for her. Education on medication was provided to patient by Clinical research associate. Patient compliant with medication administration. Patient affect is preoccupied.  Patient states she has "trouble sorting information in my head". Q15 minute safety checks maintained. Patient remains safe on the unit at this time.

## 2021-06-15 NOTE — Progress Notes (Signed)
Patient denies SI, HI, and AVH. She is pleasant and cooperative with assessment. Patient says that she feels she has been sleeping all day due to her medication. Patient denies any pain or concerns at this time. Patient remains safe on the unit at this time.

## 2021-06-16 NOTE — Progress Notes (Signed)
Patient has been medication compliant. Pleasant and cooperative. Denies SI. Still appears depressed

## 2021-06-16 NOTE — Plan of Care (Signed)
  Problem: Education: Goal: Knowledge of [place] [facility] Education information/materials will improve Outcome: Progressing Goal: Emotional status will improve Outcome: Progressing Goal: Mental status will improve Outcome: Progressing Goal: Verbalization of understanding the information provided will improve Outcome: Progressing   Problem: Activity: Goal: Interest or engagement in activities will improve Outcome: Progressing Goal: Sleeping patterns will improve Outcome: Progressing   Problem: Coping: Goal: Ability to verbalize frustrations and anger appropriately will improve Outcome: Progressing Goal: Ability to demonstrate self-control will improve Outcome: Progressing   Problem: Health Behavior/Discharge Planning: Goal: Identification of resources available to assist in meeting health care needs will improve Outcome: Progressing Goal: Compliance with treatment plan for underlying cause of condition will improve Outcome: Progressing   Problem: Physical Regulation: Goal: Ability to maintain clinical measurements within normal limits will improve Outcome: Progressing   Problem: Safety: Goal: Periods of time without injury will increase Outcome: Progressing   

## 2021-06-16 NOTE — Progress Notes (Signed)
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 female who presents to the [facility] unit due to depression and psychosis.   Interval History Patient was seen today for re-evaluation.  Nursing reports no events overnight. The patient has no issues with performing ADLs.  Patient has been medication compliant.    Subjective:  On assessment patient reports "I am doing very well". She reports being in good mood, denies feeling depressed currently, reports minimal anxiety "I am very calm". She reports her sleep had improved significantly since admission. She reports increased appetite that she relates to adverse reaction to medications and is concerned about long-term effect of elevated appetite, such as weight gain.She denies suicidal/homicidal ideations. She denies auditory/visual hallucinations. She is asking about discharge.  Labs: no new results for review.       Total Time spent with patient: 20 minutes  Past Psychiatric History: see H&P  Past Medical History:  Past Medical History:  Diagnosis Date   DVT (deep venous thrombosis) (HCC)    left calf   Heart palpitations    Medical history non-contributory    History reviewed. No pertinent surgical history. Family History:  Family History  Problem Relation Age of Onset   Allergies Mother    Cancer Mother        thyroid   Heart block Maternal Grandmother    Family Psychiatric  History: see H&P Social History:  Social History   Substance and Sexual Activity  Alcohol Use Not Currently     Social History   Substance and Sexual Activity  Drug Use Not Currently    Social History   Socioeconomic History    Marital status: Divorced    Spouse name: Not on file   Number of children: 3   Years of education: Not on file   Highest education level: Not on file  Occupational History   Occupation: RN  Tobacco Use   Smoking status: Never   Smokeless tobacco: Never  Vaping Use   Vaping Use: Unknown  Substance and Sexual Activity   Alcohol use: Not Currently   Drug use: Not Currently   Sexual activity: Yes  Other Topics Concern   Not on file  Social History Narrative   Not on file   Social Determinants of Health   Financial Resource Strain: Not on file  Food Insecurity: Not on file  Transportation Needs: Not on file  Physical Activity: Not on file  Stress: Not on file  Social Connections: Not on file   Additional Social History:                         Sleep: Good  Appetite:  Good  Current Medications: Current Facility-Administered Medications  Medication Dose Route Frequency Provider Last Rate Last Admin   acetaminophen (TYLENOL) tablet 650 mg  650 mg Oral Q6H PRN Clapacs, John T, MD       alum & mag hydroxide-simeth (MAALOX/MYLANTA) 200-200-20 MG/5ML suspension 30 mL  30 mL Oral Q4H PRN Clapacs, John T, MD       benztropine (COGENTIN) tablet 0.5 mg  0.5 mg Oral QHS Clapacs, John T, MD   0.5 mg at 06/15/21 2106  diphenhydrAMINE (BENADRYL) injection 50 mg  50 mg Intramuscular Once Clapacs, John T, MD       haloperidol lactate (HALDOL) injection 2 mg  2 mg Intramuscular BID PRN Clapacs, Madie Reno, MD       haloperidol lactate (HALDOL) injection 5 mg  5 mg Intramuscular Once Clapacs, Madie Reno, MD       hydrOXYzine (ATARAX) tablet 25 mg  25 mg Oral TID PRN Sherlon Handing, NP       hydrOXYzine (ATARAX) tablet 50 mg  50 mg Oral TID PRN Clapacs, Madie Reno, MD   50 mg at 06/13/21 1714   LORazepam (ATIVAN) tablet 1 mg  1 mg Oral Q4H PRN Sherlon Handing, NP       Or   LORazepam (ATIVAN) injection 1 mg  1 mg Intramuscular Q4H PRN Waldon Merl F, NP       magnesium hydroxide  (MILK OF MAGNESIA) suspension 30 mL  30 mL Oral Daily PRN Clapacs, John T, MD       mirtazapine (REMERON) tablet 30 mg  30 mg Oral QHS Clapacs, John T, MD   30 mg at 06/15/21 2106   OLANZapine (ZYPREXA) tablet 5 mg  5 mg Oral BID PRN Waldon Merl F, NP       polyethylene glycol (MIRALAX / GLYCOLAX) packet 17 g  17 g Oral Daily PRN Rulon Sera, MD       risperiDONE (RISPERDAL M-TABS) disintegrating tablet 1 mg  1 mg Oral BID Clapacs, Madie Reno, MD   1 mg at 06/16/21 0815    Lab Results: No results found for this or any previous visit (from the past 48 hour(s)).  Blood Alcohol level:  Lab Results  Component Value Date   ETH <10 123456    Metabolic Disorder Labs: Lab Results  Component Value Date   HGBA1C 5.5 06/04/2021   MPG 111 06/04/2021   No results found for: PROLACTIN Lab Results  Component Value Date   CHOL 148 06/04/2021   TRIG 31 06/04/2021   HDL 71 06/04/2021   CHOLHDL 2.1 06/04/2021   VLDL 6 06/04/2021   LDLCALC 71 06/04/2021   LDLCALC 76 05/24/2021    Physical Findings: AIMS:  , ,  ,  ,    CIWA:    COWS:     Musculoskeletal: Strength & Muscle Tone: within normal limits Gait & Station: normal Patient leans: N/A  Psychiatric Specialty Exam:  Presentation  General Appearance: Appropriate for Environment  Eye Contact:Fair  Speech: normal  Speech Volume:Normal  Handedness:No data recorded  Mood and Affect  Mood: euphoric  Affect: restricted  Thought Process  Thought Processes: appears organized  Descriptions of Associations: appropriate  Orientation:Full (Time, Place and Person)  Thought Content: denies hallucinations, does not express delusions  History of Schizophrenia/Schizoaffective disorder:No  Duration of Psychotic Symptoms:Less than six months  Hallucinations:No data recorded Ideas of Reference:-- (unable to assess)  Suicidal Thoughts: denies Homicidal Thoughts: denies  Sensorium  Memory:Immediate Poor  Judgment:  fair Insight: fair  Executive Functions  Concentration: fair Attention Span: fair Recall: fair Fund of Knowledge:Fair  Language:Fair   Psychomotor Activity  Psychomotor Activity: wnl Assets  Assets:Desire for Improvement; Financial Resources/Insurance; Housing; Resilience; Social Support; Physical Health   Sleep  Sleep:No data recorded   Physical Exam: Physical Exam ROS Blood pressure 122/75, pulse 72, temperature 98.8 F (37.1 C), temperature source Oral, resp. rate 17, height 5\' 3"  (1.6 m), weight 68 kg, SpO2 100 %. Body mass index is 26.56  kg/m.   Treatment Plan Summary: Daily contact with patient to assess and evaluate symptoms and progress in treatment and Medication management  Patient is a 51 year old female with the above-stated past psychiatric history who is seen in follow-up.  Chart reviewed. Patient discussed with nursing. Patient continues to report mood and sleep improvement and to deny symptoms of psychosis. No changes in medicines made today.   Plan:  -continue inpatient psych admission; 15-minute checks; daily contact with patient to assess and evaluate symptoms and progress in treatment; psychoeducation.  -continue scheduled medications: Mirtazapine 30mg  PO QHS for depression Risperidone 1mg  PO BID for psychotic sx. Benztropine 0.5mg  PO BID for EPS propx.  -continue PRN medications.  acetaminophen, alum & mag hydroxide-simeth, haloperidol lactate, hydrOXYzine, hydrOXYzine, LORazepam **OR** LORazepam, magnesium hydroxide, OLANZapine, polyethylene glycol  -Pertinent Labs: no new labs ordered today   -Disposition: Estimated duration of hospitalization: 2-3 days, early next week. All necessary aftercare will be arranged prior to discharge Likely d/c home with outpatient psych follow-up.  -  I certify that the patient does need, on a daily basis, active treatment furnished directly by or requiring the supervision of inpatient psychiatric facility  personnel.    Larita Fife, MD 06/16/2021, 9:55 AM

## 2021-06-16 NOTE — Progress Notes (Signed)
Patient denies pain. Patient denies anxiety and depression. Patient denies SI/HI/AVH. Patient presents mildly confused throughout the day. Patient repeatedly states "I just dont know whats going on" and "I dont know what is happening". Patient compliant with medication administration. Q15 minute safety checks maintained. Patient remains safe on the unit at this time.

## 2021-06-16 NOTE — Group Note (Signed)
LCSW Group Therapy Note  Group Date: 06/16/2021 Start Time: 1300 End Time: 1400   Type of Therapy and Topic:  Group Therapy - Healthy vs Unhealthy Coping Skills  Participation Level:  None   Description of Group The focus of this group was to determine what unhealthy coping techniques typically are used by group members and what healthy coping techniques would be helpful in coping with various problems. Patients were guided in becoming aware of the differences between healthy and unhealthy coping techniques. Patients were asked to identify 2-3 healthy coping skills they would like to learn to use more effectively.  Therapeutic Goals Patients learned that coping is what human beings do all day long to deal with various situations in their lives Patients defined and discussed healthy vs unhealthy coping techniques Patients identified their preferred coping techniques and identified whether these were healthy or unhealthy Patients determined 2-3 healthy coping skills they would like to become more familiar with and use more often. Patients provided support and ideas to each other   Summary of Patient Progress: Patient presented to group but left prior to the start of the session. Patient was observed reviewing the supplemental handouts.    Therapeutic Modalities Cognitive Behavioral Therapy Motivational Interviewing  Norberto Sorenson, Theresia Majors 06/16/2021  2:22 PM

## 2021-06-16 NOTE — Progress Notes (Signed)
Patient's mom visited tonight. Patient still appears flat and sad with some thought blocking. She denies SI, HI and AVH and has remained medication compliant

## 2021-06-16 NOTE — Plan of Care (Signed)
  Problem: Education: Goal: Knowledge of [place] [facility] Education information/materials will improve Outcome: Progressing Goal: Emotional status will improve Outcome: Progressing Goal: Mental status will improve Outcome: Progressing Goal: Verbalization of understanding the information provided will improve Outcome: Progressing   Problem: Activity: Goal: Interest or engagement in activities will improve Outcome: Progressing Goal: Sleeping patterns will improve Outcome: Progressing   Problem: Coping: Goal: Ability to verbalize frustrations and anger appropriately will improve Outcome: Progressing Goal: Ability to demonstrate self-control will improve Outcome: Progressing   Problem: Health Behavior/Discharge Planning: Goal: Identification of resources available to assist in meeting health care needs will improve Outcome: Progressing Goal: Compliance with treatment plan for underlying cause of condition will improve Outcome: Progressing   Problem: Physical Regulation: Goal: Ability to maintain clinical measurements within normal limits will improve Outcome: Progressing   Problem: Safety: Goal: Periods of time without injury will increase Outcome: Progressing   

## 2021-06-17 NOTE — Plan of Care (Signed)
  Problem: Education: Goal: Knowledge of [place] [facility] Education information/materials will improve Outcome: Progressing Goal: Verbalization of understanding the information provided will improve Outcome: Progressing   Problem: Coping: Goal: Ability to verbalize frustrations and anger appropriately will improve Outcome: Progressing   Problem: Health Behavior/Discharge Planning: Goal: Compliance with treatment plan for underlying cause of condition will improve Outcome: Progressing   Problem: Physical Regulation: Goal: Ability to maintain clinical measurements within normal limits will improve Outcome: Progressing   Problem: Safety: Goal: Periods of time without injury will increase Outcome: Progressing   

## 2021-06-17 NOTE — Progress Notes (Signed)
Patient denies pain. Patient denies SI/HI/AVH. Patient compliant with medication administration. Patient presents with mild confusion. Patient frequently comes up to nurses station for different reasons such as asking when she would be able to play Internet games and asking when she would be discharged.   Q15 minute safety checks maintained. Patient remains safe on the unit at this time.

## 2021-06-17 NOTE — Group Note (Signed)
LCSW Group Therapy Note  Group Date: 06/17/2021 Start Time: 1300 End Time: 1400   Type of Therapy and Topic:  Group Therapy - How To Cope with Nervousness about Discharge   Participation Level:  Did Not Attend   Description of Group This process group involved identification of patients' feelings about discharge. Some of them are scheduled to be discharged soon, while others are new admissions, but each of them was asked to share thoughts and feelings surrounding discharge from the hospital. One common theme was that they are excited at the prospect of going home, while another was that many of them are apprehensive about sharing why they were hospitalized. Patients were given the opportunity to discuss these feelings with their peers in preparation for discharge.  Therapeutic Goals  Patient will identify their overall feelings about pending discharge. Patient will think about how they might proactively address issues that they believe will once again arise once they get home (i.e. with parents). Patients will participate in discussion about having hope for change.   Summary of Patient Progress:  Patient did not attend group despite encouraged participation.    Therapeutic Modalities Cognitive Behavioral Therapy   Tonya Watkins 06/17/2021  2:42 PM

## 2021-06-17 NOTE — Progress Notes (Signed)
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 female who presents to the [facility] unit due to depression and psychosis.   Interval History Patient was seen today for re-evaluation.  Nursing reports no events overnight. The patient has no issues with performing ADLs.  Patient has been medication compliant.    Subjective:  On assessment patient reports "I am good". She reports being in good mood, denies feeling depressed currently, reports minimal anxiety. She reports her sleep had improved significantly since admission. She denies suicidal/homicidal ideations. She denies auditory/visual hallucinations. She is hoping to be discharged tomorrow. Reports her plan as "to stay with mother, get back to normal life, follow outpatient mental referrals".   Labs: no new results for review.    Total Time spent with patient: 20 minutes  Past Psychiatric History: see H&P  Past Medical History:  Past Medical History:  Diagnosis Date   DVT (deep venous thrombosis) (HCC)    left calf   Heart palpitations    Medical history non-contributory    History reviewed. No pertinent surgical history. Family History:  Family History  Problem Relation Age of Onset   Allergies Mother    Cancer Mother        thyroid   Heart block Maternal Grandmother    Family Psychiatric  History: see H&P Social History:  Social History   Substance and Sexual Activity  Alcohol Use Not Currently     Social History   Substance and Sexual Activity  Drug Use Not Currently    Social History   Socioeconomic History   Marital status: Divorced    Spouse name: Not on file   Number of children: 3    Years of education: Not on file   Highest education level: Not on file  Occupational History   Occupation: RN  Tobacco Use   Smoking status: Never   Smokeless tobacco: Never  Vaping Use   Vaping Use: Unknown  Substance and Sexual Activity   Alcohol use: Not Currently   Drug use: Not Currently   Sexual activity: Yes  Other Topics Concern   Not on file  Social History Narrative   Not on file   Social Determinants of Health   Financial Resource Strain: Not on file  Food Insecurity: Not on file  Transportation Needs: Not on file  Physical Activity: Not on file  Stress: Not on file  Social Connections: Not on file   Additional Social History:                         Sleep: Good  Appetite:  Good  Current Medications: Current Facility-Administered Medications  Medication Dose Route Frequency Provider Last Rate Last Admin   acetaminophen (TYLENOL) tablet 650 mg  650 mg Oral Q6H PRN Clapacs, John T, MD       alum & mag hydroxide-simeth (MAALOX/MYLANTA) 200-200-20 MG/5ML suspension 30 mL  30 mL Oral Q4H PRN Clapacs, John T, MD       benztropine (COGENTIN) tablet 0.5 mg  0.5 mg Oral QHS Clapacs, John T, MD   0.5 mg at 06/16/21 2117   diphenhydrAMINE (BENADRYL) injection 50 mg  50 mg Intramuscular Once Clapacs,  Madie Reno, MD       haloperidol lactate (HALDOL) injection 2 mg  2 mg Intramuscular BID PRN Clapacs, Madie Reno, MD       haloperidol lactate (HALDOL) injection 5 mg  5 mg Intramuscular Once Clapacs, Madie Reno, MD       hydrOXYzine (ATARAX) tablet 25 mg  25 mg Oral TID PRN Sherlon Handing, NP       hydrOXYzine (ATARAX) tablet 50 mg  50 mg Oral TID PRN Clapacs, Madie Reno, MD   50 mg at 06/13/21 1714   LORazepam (ATIVAN) tablet 1 mg  1 mg Oral Q4H PRN Sherlon Handing, NP       Or   LORazepam (ATIVAN) injection 1 mg  1 mg Intramuscular Q4H PRN Waldon Merl F, NP       magnesium hydroxide (MILK OF MAGNESIA) suspension 30 mL  30 mL Oral Daily PRN Clapacs, John T, MD        mirtazapine (REMERON) tablet 30 mg  30 mg Oral QHS Clapacs, John T, MD   30 mg at 06/16/21 2117   OLANZapine (ZYPREXA) tablet 5 mg  5 mg Oral BID PRN Waldon Merl F, NP       polyethylene glycol (MIRALAX / GLYCOLAX) packet 17 g  17 g Oral Daily PRN Rulon Sera, MD       risperiDONE (RISPERDAL M-TABS) disintegrating tablet 1 mg  1 mg Oral BID Clapacs, Madie Reno, MD   1 mg at 06/17/21 0818    Lab Results: No results found for this or any previous visit (from the past 48 hour(s)).  Blood Alcohol level:  Lab Results  Component Value Date   ETH <10 123456    Metabolic Disorder Labs: Lab Results  Component Value Date   HGBA1C 5.5 06/04/2021   MPG 111 06/04/2021   No results found for: PROLACTIN Lab Results  Component Value Date   CHOL 148 06/04/2021   TRIG 31 06/04/2021   HDL 71 06/04/2021   CHOLHDL 2.1 06/04/2021   VLDL 6 06/04/2021   LDLCALC 71 06/04/2021   LDLCALC 76 05/24/2021    Physical Findings: AIMS:  , ,  ,  ,    CIWA:    COWS:     Musculoskeletal: Strength & Muscle Tone: within normal limits Gait & Station: normal Patient leans: N/A  Psychiatric Specialty Exam:  Presentation  General Appearance: Appropriate for Environment  Eye Contact:Fair  Speech: normal  Speech Volume:Normal  Handedness:No data recorded  Mood and Affect  Mood: euphoric  Affect: restricted  Thought Process  Thought Processes: appears organized  Descriptions of Associations: appropriate  Orientation:Full (Time, Place and Person)  Thought Content: denies hallucinations, does not express delusions  History of Schizophrenia/Schizoaffective disorder:No  Duration of Psychotic Symptoms:Less than six months  Hallucinations:No data recorded Ideas of Reference:-- (unable to assess)  Suicidal Thoughts: denies Homicidal Thoughts: denies  Sensorium  Memory:Immediate Poor  Judgment: fair Insight: fair  Executive Functions  Concentration: fair Attention Span:  fair Recall: fair Fund of Knowledge:Fair  Language:Fair   Psychomotor Activity  Psychomotor Activity: wnl Assets  Assets:Desire for Improvement; Financial Resources/Insurance; Housing; Resilience; Social Support; Physical Health   Sleep  Sleep:No data recorded   Physical Exam: Physical Exam ROS Blood pressure 134/80, pulse 79, temperature 98.1 F (36.7 C), temperature source Oral, resp. rate 18, height 5\' 3"  (1.6 m), weight 68 kg, SpO2 100 %. Body mass index is 26.56 kg/m.   Treatment Plan Summary: Daily contact with patient to  assess and evaluate symptoms and progress in treatment and Medication management  Patient is a 51 year old female with the above-stated past psychiatric history who is seen in follow-up.  Chart reviewed. Patient discussed with nursing. Patient continues to report mood and sleep improvement and to deny symptoms of psychosis. No changes in medicines made today.   Plan:  -continue inpatient psych admission; 15-minute checks; daily contact with patient to assess and evaluate symptoms and progress in treatment; psychoeducation.  -continue scheduled medications: Mirtazapine 30mg  PO QHS for depression Risperidone 1mg  PO BID for psychotic sx. Benztropine 0.5mg  PO BID for EPS propx.  -continue PRN medications.  acetaminophen, alum & mag hydroxide-simeth, haloperidol lactate, hydrOXYzine, hydrOXYzine, LORazepam **OR** LORazepam, magnesium hydroxide, OLANZapine, polyethylene glycol  -Pertinent Labs: no new labs ordered today   -Disposition: Estimated duration of hospitalization: 2-3 days, early next week. All necessary aftercare will be arranged prior to discharge Likely d/c home with outpatient psych follow-up.  -  I certify that the patient does need, on a daily basis, active treatment furnished directly by or requiring the supervision of inpatient psychiatric facility personnel.    Larita Fife, MD 06/17/2021, 12:15 PM

## 2021-06-17 NOTE — Progress Notes (Signed)
Patient came up to nurses station asking what she would need to do for an atropine overdose. When writer asked why the patient was asking about an atropine overdose they stated they felt like they were going through an atropine overdose. After reassuring patient that they are not going through an atropine overdose patient states "I was just wondering how it should be handled, I don't think it is happening anymore"  Patient comes back to the nursing station shortly after asking writer "what is with all the reenactments happening on the unit?" When asked to explain patient has a delayed response and states "I don't know what is going on".

## 2021-06-18 NOTE — Progress Notes (Signed)
Patient is calm and cooperative with assessment. Affect is flat. Patient denies depression and anxiety and rates both as a 0/10. She denies pain and other physical problems. Patient is compliant with scheduled medications. Patient is observed to be interacting appropriately with staff and other patients on the unit. Patient remains safe on the unit at this time.

## 2021-06-18 NOTE — Group Note (Signed)
[facility] LCSW Group Therapy Note    Group Date: 06/18/2021 Start Time: 1300 End Time: 1400  Type of Therapy and Topic:  Group Therapy:  Overcoming Obstacles  Participation Level:  [facility] PARTICIPATION LEVEL: Active  Mood: Euthymic  Description of Group:   In this group patients will be encouraged to explore what they see as obstacles to their own wellness and recovery. They will be guided to discuss their thoughts, feelings, and behaviors related to these obstacles. The group will process together ways to cope with barriers, with attention given to specific choices patients can make. Each patient will be challenged to identify changes they are motivated to make in order to overcome their obstacles. This group will be process-oriented, with patients participating in exploration of their own experiences as well as giving and receiving support and challenge from other group members.  Therapeutic Goals: 1. Patient will identify personal and current obstacles as they relate to admission. 2. Patient will identify barriers that currently interfere with their wellness or overcoming obstacles.  3. Patient will identify feelings, thought process and behaviors related to these barriers. 4. Patient will identify two changes they are willing to make to overcome these obstacles:    Summary of Patient Progress   Patient was present for the entirety of the group session. Patient was an active listener and participated in the topic of discussion, provided helpful advice to others, and added nuance to topic of conversation. Patient shared specific examples of obstacles in her life. States that certain people can present as obstacles.    Therapeutic Modalities:   Cognitive Behavioral Therapy Solution Focused Therapy Motivational Interviewing Relapse Prevention Therapy   Corky Crafts, [place]

## 2021-06-18 NOTE — BH IP Treatment Plan (Signed)
Interdisciplinary Treatment and Diagnostic Plan Update  06/18/2021 Time of Session: 0900 Tonya Watkins MRN: [id number]  Principal Diagnosis: Brief reactive psychosis (HCC)  Secondary Diagnoses: Principal Problem:   Brief reactive psychosis (HCC) Active Problems:   Palpitations   PVC's (premature ventricular contractions)   Major depressive disorder, single episode, severe with psychotic features (HCC)   Current Medications:  Current Facility-Administered Medications  Medication Dose Route Frequency Provider Last Rate Last Admin   acetaminophen (TYLENOL) tablet 650 mg  650 mg Oral Q6H PRN Clapacs, Jackquline Denmark, MD       alum & mag hydroxide-simeth (MAALOX/MYLANTA) 200-200-20 MG/5ML suspension 30 mL  30 mL Oral Q4H PRN Clapacs, Jackquline Denmark, MD       benztropine (COGENTIN) tablet 0.5 mg  0.5 mg Oral QHS Clapacs, John T, MD   0.5 mg at 06/17/21 2111   diphenhydrAMINE (BENADRYL) injection 50 mg  50 mg Intramuscular Once Clapacs, Jackquline Denmark, MD       haloperidol lactate (HALDOL) injection 2 mg  2 mg Intramuscular BID PRN Clapacs, Jackquline Denmark, MD       haloperidol lactate (HALDOL) injection 5 mg  5 mg Intramuscular Once Clapacs, Jackquline Denmark, MD       hydrOXYzine (ATARAX) tablet 25 mg  25 mg Oral TID PRN Vanetta Mulders, NP       hydrOXYzine (ATARAX) tablet 50 mg  50 mg Oral TID PRN Clapacs, Jackquline Denmark, MD   50 mg at 06/13/21 1714   LORazepam (ATIVAN) tablet 1 mg  1 mg Oral Q4H PRN Vanetta Mulders, NP       Or   LORazepam (ATIVAN) injection 1 mg  1 mg Intramuscular Q4H PRN Gabriel Cirri F, NP       magnesium hydroxide (MILK OF MAGNESIA) suspension 30 mL  30 mL Oral Daily PRN Clapacs, Jackquline Denmark, MD       mirtazapine (REMERON) tablet 30 mg  30 mg Oral QHS Clapacs, John T, MD   30 mg at 06/17/21 2111   OLANZapine (ZYPREXA) tablet 5 mg  5 mg Oral BID PRN Vanetta Mulders, NP       polyethylene glycol (MIRALAX / GLYCOLAX) packet 17 g  17 g Oral Daily PRN Reggie Pile, MD       risperiDONE (RISPERDAL M-TABS)  disintegrating tablet 1 mg  1 mg Oral BID Clapacs, John T, MD   1 mg at 06/18/21 0759   PTA Medications: Medications Prior to Admission  Medication Sig Dispense Refill Last Dose   buPROPion (WELLBUTRIN XL) 300 MG 24 hr tablet Take 1 tablet by mouth once daily 30 tablet 5     Patient Stressors: Marital or family conflict    Patient Strengths: Supportive family/friends   Treatment Modalities: Medication Management, Group therapy, Case management,  1 to 1 session with clinician, Psychoeducation, Recreational therapy.   Physician Treatment Plan for Primary Diagnosis: Brief reactive psychosis (HCC) Long Term Goal(s): Improvement in symptoms so as ready for discharge   Short Term Goals: Compliance with prescribed medications will improve Ability to identify triggers associated with substance abuse/mental health issues will improve Ability to verbalize feelings will improve Ability to identify and develop effective coping behaviors will improve  Medication Management: Evaluate patient's response, side effects, and tolerance of medication regimen.  Therapeutic Interventions: 1 to 1 sessions, Unit Group sessions and Medication administration.  Evaluation of Outcomes: Not Met  Physician Treatment Plan for Secondary Diagnosis: Principal Problem:   Brief reactive psychosis (HCC) Active Problems:  Palpitations   PVC's (premature ventricular contractions)   Major depressive disorder, single episode, severe with psychotic features ([place])  Long Term Goal(s): Improvement in symptoms so as ready for discharge   Short Term Goals: Compliance with prescribed medications will improve Ability to identify triggers associated with substance abuse/mental health issues will improve Ability to verbalize feelings will improve Ability to identify and develop effective coping behaviors will improve     Medication Management: Evaluate patient's response, side effects, and tolerance of medication  regimen.  Therapeutic Interventions: 1 to 1 sessions, Unit Group sessions and Medication administration.  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[place]) Long Term Goal(s): Knowledge of disease and therapeutic regimen to maintain health will improve  Short Term Goals: Ability to remain free from injury will improve, Ability to verbalize frustration and anger appropriately will improve, Ability to demonstrate self-control, Ability to participate in decision making will improve, Ability to verbalize feelings will improve, Ability to disclose and discuss suicidal ideas, Ability to identify and develop effective coping behaviors will improve, and Compliance with prescribed medications will improve  Medication Management: RN will administer medications as ordered by provider, will assess and evaluate patient's response and provide education to patient for prescribed medication. RN will report any adverse and/or side effects to prescribing provider.  Therapeutic Interventions: 1 on 1 counseling sessions, Psychoeducation, Medication administration, Evaluate responses to treatment, Monitor vital signs and CBGs as ordered, Perform/monitor CIWA, COWS, AIMS and Fall Risk screenings as ordered, Perform wound care treatments as ordered.  Evaluation of Outcomes: Not Met   LCSW Treatment Plan for Primary Diagnosis: Brief reactive psychosis ([place]) Long Term Goal(s): Safe transition to appropriate next level of care at discharge, Engage patient in therapeutic group addressing interpersonal concerns.  Short Term Goals: Engage patient in aftercare planning with referrals and resources, Increase social support, Increase ability to appropriately verbalize feelings, Increase emotional regulation, Facilitate acceptance of mental health diagnosis and concerns, Facilitate patient progression through stages of change regarding substance use diagnoses and concerns,  Identify triggers associated with mental health/substance abuse issues, and Increase skills for wellness and recovery  Therapeutic Interventions: Assess for all discharge needs, 1 to 1 time with Social worker, Explore available resources and support systems, Assess for adequacy in community support network, Educate family and significant other(s) on suicide prevention, Complete Psychosocial Assessment, Interpersonal group therapy.  Evaluation of Outcomes: Not Met   Progress in Treatment: Attending groups: No. Participating in groups: No. Taking medication as prescribed: Yes. Toleration medication: Yes. Family/Significant other contact made: Yes, individual(s) contacted:  SPE completed with Prentice Docker, mother.  Patient understands diagnosis: No. Discussing patient identified problems/goals with staff: Yes. Medical problems stabilized or resolved: Yes. Denies suicidal/homicidal ideation: Yes. Issues/concerns per patient self-inventory: Yes. Other: none  New problem(s) identified: No, Describe:  No  additional problems identified, patient continues to present with  bizarre presence, behaviors, and thoughts. Lacks incite into her condition.   New Short Term/Long Term Goal(s):Patient to continue working towards detox, elimination of symptoms of psychosis, medication management for mood stabilization; elimination of SI thoughts; development of comprehensive mental wellness/sobriety plan.  Patient Goals:  No additional goals identified at this time, patient to continue to work towards original goal.   Discharge Plan or Barriers: No barriers identified at this time. Patient to discharge home when appropriate for discharge.   Reason for Continuation of Hospitalization: Other; describe Bizarre presentation, thoughts, and behaviors.   Estimated Length of Stay:  TBD    Scribe for Treatment Team: Larose Kells 06/18/2021 10:12 AM

## 2021-06-18 NOTE — Plan of Care (Signed)
?  Problem: Education: ?Goal: Knowledge of [place] [facility] Education information/materials will improve ?Outcome: Not Progressing ?Goal: Emotional status will improve ?Outcome: Not Progressing ?Goal: Mental status will improve ?Outcome: Not Progressing ?Goal: Verbalization of understanding the information provided will improve ?Outcome: Not Progressing ?  ?Problem: Activity: ?Goal: Interest or engagement in activities will improve ?Outcome: Not Progressing ?Goal: Sleeping patterns will improve ?Outcome: Not Progressing ?  ?Problem: Coping: ?Goal: Ability to verbalize frustrations and anger appropriately will improve ?Outcome: Not Progressing ?Goal: Ability to demonstrate self-control will improve ?Outcome: Not Progressing ?  ?Problem: Health Behavior/Discharge Planning: ?Goal: Identification of resources available to assist in meeting health care needs will improve ?Outcome: Not Progressing ?Goal: Compliance with treatment plan for underlying cause of condition will improve ?Outcome: Not Progressing ?  ?Problem: Physical Regulation: ?Goal: Ability to maintain clinical measurements within normal limits will improve ?Outcome: Not Progressing ?  ?Problem: Safety: ?Goal: Periods of time without injury will increase ?Outcome: Not Progressing ?  ?

## 2021-06-18 NOTE — Progress Notes (Signed)
[facility] MD Progress Note  06/18/2021 3:52 PM Tonya Watkins  MRN:  [id number] Subjective: Follow-up for this 51 year old woman who is recovering from what seems to probably be a psychotic depression.  In interview today the patient tells me that she is no longer hearing voices or feeling like information is "overwhelming" her.  I spoke with her mother with her consent and the mother tells me patient is still complaining of hearing voices but says they are not bothering her as much.  Also mother says the patient still complains of not sleeping well although the patient tells me that has gotten better.  Behavior has certainly improved without any further outbursts. Principal Problem: Brief reactive psychosis ([place]) Diagnosis: Principal Problem:   Brief reactive psychosis ([place]) Active Problems:   Palpitations   PVC's (premature ventricular contractions)   Major depressive disorder, single episode, severe with psychotic features ([place])  Total Time spent with patient: 30 minutes  Past Psychiatric History: This seems to be an episode that has been brewing up for months  Past Medical History:  Past Medical History:  Diagnosis Date   DVT (deep venous thrombosis) (HCC)    left calf   Heart palpitations    Medical history non-contributory    History reviewed. No pertinent surgical history. Family History:  Family History  Problem Relation Age of Onset   Allergies Mother    Cancer Mother        thyroid   Heart block Maternal Grandmother    Family Psychiatric  History: See previous Social History:  Social History   Substance and Sexual Activity  Alcohol Use Not Currently     Social History   Substance and Sexual Activity  Drug Use Not Currently    Social History   Socioeconomic History   Marital status: Divorced    Spouse name: Not on file   Number of children: 3   Years of education: Not on file   Highest education level: Not on file  Occupational History   Occupation: RN  Tobacco  Use   Smoking status: Never   Smokeless tobacco: Never  Vaping Use   Vaping Use: Unknown  Substance and Sexual Activity   Alcohol use: Not Currently   Drug use: Not Currently   Sexual activity: Yes  Other Topics Concern   Not on file  Social History Narrative   Not on file   Social Determinants of Health   Financial Resource Strain: Not on file  Food Insecurity: Not on file  Transportation Needs: Not on file  Physical Activity: Not on file  Stress: Not on file  Social Connections: Not on file   Additional Social History:                         Sleep: Fair  Appetite:  Fair  Current Medications: Current Facility-Administered Medications  Medication Dose Route Frequency Provider Last Rate Last Admin   acetaminophen (TYLENOL) tablet 650 mg  650 mg Oral Q6H PRN Raciel Caffrey T, MD       alum & mag hydroxide-simeth (MAALOX/MYLANTA) 200-200-20 MG/5ML suspension 30 mL  30 mL Oral Q4H PRN Morena Mckissack T, MD       benztropine (COGENTIN) tablet 0.5 mg  0.5 mg Oral QHS Evyn Kooyman T, MD   0.5 mg at 06/17/21 2111   diphenhydrAMINE (BENADRYL) injection 50 mg  50 mg Intramuscular Once Sollie Vultaggio T, MD       haloperidol lactate (HALDOL) injection 2 mg  2 mg Intramuscular BID PRN Vail Basista, Madie Reno, MD       haloperidol lactate (HALDOL) injection 5 mg  5 mg Intramuscular Once Kadar Chance, Madie Reno, MD       hydrOXYzine (ATARAX) tablet 25 mg  25 mg Oral TID PRN Sherlon Handing, NP       hydrOXYzine (ATARAX) tablet 50 mg  50 mg Oral TID PRN Zaiya Annunziato, Madie Reno, MD   50 mg at 06/13/21 1714   LORazepam (ATIVAN) tablet 1 mg  1 mg Oral Q4H PRN Sherlon Handing, NP       Or   LORazepam (ATIVAN) injection 1 mg  1 mg Intramuscular Q4H PRN Waldon Merl F, NP       magnesium hydroxide (MILK OF MAGNESIA) suspension 30 mL  30 mL Oral Daily PRN Rashae Rother T, MD       mirtazapine (REMERON) tablet 30 mg  30 mg Oral QHS Ellsie Violette T, MD   30 mg at 06/17/21 2111   OLANZapine (ZYPREXA)  tablet 5 mg  5 mg Oral BID PRN Waldon Merl F, NP       polyethylene glycol (MIRALAX / GLYCOLAX) packet 17 g  17 g Oral Daily PRN Rulon Sera, MD       risperiDONE (RISPERDAL M-TABS) disintegrating tablet 1 mg  1 mg Oral BID Ladaija Dimino, Madie Reno, MD   1 mg at 06/18/21 [id number]    Lab Results: No results found for this or any previous visit (from the past 48 hour(s)).  Blood Alcohol level:  Lab Results  Component Value Date   ETH <10 [id number]    Metabolic Disorder Labs: Lab Results  Component Value Date   HGBA1C 5.5 06/04/2021   MPG 111 06/04/2021   No results found for: PROLACTIN Lab Results  Component Value Date   CHOL 148 06/04/2021   TRIG 31 06/04/2021   HDL 71 06/04/2021   CHOLHDL 2.1 06/04/2021   VLDL 6 06/04/2021   LDLCALC 71 06/04/2021   LDLCALC 76 05/24/2021    Physical Findings: AIMS:  , ,  ,  ,    CIWA:    COWS:     Musculoskeletal: Strength & Muscle Tone: within normal limits Gait & Station: normal Patient leans: N/A  Psychiatric Specialty Exam:  Presentation  General Appearance: Appropriate for Environment  Eye Contact:Fair  Speech:Blocked  Speech Volume:Normal  Handedness:No data recorded  Mood and Affect  Mood:Dysphoric  Affect:Blunt   Thought Process  Thought Processes:Disorganized  Descriptions of Associations:Loose  Orientation:Full (Time, Place and Person)  Thought Content:Illusions  History of Schizophrenia/Schizoaffective disorder:No  Duration of Psychotic Symptoms:Less than six months  Hallucinations:No data recorded Ideas of Reference:-- (unable to assess)  Suicidal Thoughts:No data recorded Homicidal Thoughts:No data recorded  Sensorium  Memory:Immediate Poor  Judgment:Poor  Insight:Poor   Executive Functions  Concentration:Poor  Attention Span:Poor  Seth Ward   Psychomotor Activity  Psychomotor Activity:No data recorded  Assets  Assets:Desire for  Improvement; Financial Resources/Insurance; Housing; Resilience; Social Support; Physical Health   Sleep  Sleep:No data recorded   Physical Exam: Physical Exam Vitals and nursing note reviewed.  Constitutional:      Appearance: Normal appearance.  HENT:     Head: Normocephalic and atraumatic.     Mouth/Throat:     Pharynx: Oropharynx is clear.  Eyes:     Pupils: Pupils are equal, round, and reactive to light.  Cardiovascular:     Rate and Rhythm: Normal rate and regular rhythm.  Pulmonary:     Effort: Pulmonary effort is normal.     Breath sounds: Normal breath sounds.  Abdominal:     General: Abdomen is flat.     Palpations: Abdomen is soft.  Musculoskeletal:        General: Normal range of motion.  Skin:    General: Skin is warm and dry.  Neurological:     General: No focal deficit present.     Mental Status: She is alert. Mental status is at baseline.  Psychiatric:        Attention and Perception: Attention normal.        Mood and Affect: Mood normal. Affect is blunt.        Speech: Speech normal.        Behavior: Behavior is slowed.        Thought Content: Thought content normal.        Cognition and Memory: Cognition normal.   Review of Systems  Constitutional: Negative.   HENT: Negative.    Eyes: Negative.   Respiratory: Negative.    Cardiovascular: Negative.   Gastrointestinal: Negative.   Musculoskeletal: Negative.   Skin: Negative.   Neurological: Negative.   Psychiatric/Behavioral: Negative.    Blood pressure (!) 128/51, pulse 74, temperature 98.7 F (37.1 C), temperature source Oral, resp. rate 18, height 5\' 3"  (1.6 m), weight 68 kg, SpO2 100 %. Body mass index is 26.56 kg/m.   Treatment Plan Summary: Medication management and Plan patient was strongly reminded that medication has made a big difference for her and that when she is discharged we very much recommend that she continue it.  The plan at this point will be for discharge tomorrow.   Follow-up to be arranged.  Alethia Berthold, MD 06/18/2021, 3:53 PM

## 2021-06-18 NOTE — Progress Notes (Signed)
Still appears very flat and depressed. Continues to have some thought blocking and seems mildly confused at times. She woke up once during the night complaining of hunger and was given a snack. Compliant with medications but takes them hesitantly and forgets what they are despite being educated

## 2021-06-18 NOTE — Progress Notes (Signed)
Pt presents with some confusion, during medication administration the patient went back and forth between wanting her night medication. Pt ultimately decided to take her night medications from this Clinical research associate. Pt given education, support and encouragement to be active in her treatment plan. Pt being monitored Q 15 minutes for safety per unit protocol. Pt remains safe on the unit.

## 2021-06-18 NOTE — Progress Notes (Signed)
Recreation Therapy Notes  Date: 06/18/2021  Time: 10:40 am   Location: Courtyard    Behavioral response: Appropriate  Intervention Topic: Leisure   Discussion/Intervention:  Group content today was focused on leisure. The group defined what leisure is and some positive leisure activities they participate in. Individuals identified the difference between good and bad leisure. Participants expressed how they feel after participating in the leisure of their choice. The group discussed how they go about picking a leisure activity and if others are involved in their leisure activities. The patient stated how many leisure activities they have to choose from and reasons why it is important to have leisure time. Individuals participated in the intervention Exploration of Leisure where they had a chance to identify new leisure activities as well as benefits of leisure. Clinical Observations/Feedback: Patient came to group late and was engaged and social with peers and staff. Individual participated in the intervention and was able to focus on the topic at hand.  Nethaniel Mattie LRT/CTRS         Lavaya Defreitas 06/18/2021 12:15 PM        Sharissa Brierley 06/18/2021 12:05 PM

## 2021-06-19 MED ORDER — BENZTROPINE MESYLATE 0.5 MG PO TABS: 0.5000 mg | ORAL_TABLET | Freq: Every day | ORAL | 1 refills | Status: DC

## 2021-06-19 MED ORDER — RISPERIDONE 1 MG PO TBDP: 1.0000 mg | ORAL_TABLET | Freq: Two times a day (BID) | ORAL | 1 refills | Status: DC

## 2021-06-19 MED ORDER — MIRTAZAPINE 30 MG PO TABS: 30.0000 mg | ORAL_TABLET | Freq: Every day | ORAL | 1 refills | Status: DC

## 2021-06-19 MED ORDER — HYDROXYZINE HCL 50 MG PO TABS: 50.0000 mg | ORAL_TABLET | Freq: Three times a day (TID) | ORAL | 1 refills | Status: DC | PRN

## 2021-06-19 NOTE — Progress Notes (Signed)
°  [facility] Adult Case Management Discharge Plan :  Will you be returning to the same living situation after discharge:  Yes,  Patient to discharge to place of residence in the care of mother, Jones Skene.  At discharge, do you have transportation home?: Yes,  Patient's mother to assist with transportation at discharge.  Do you have the ability to pay for your medications: Yes,  [employer].   Release of information consent forms completed and in the chart;  Patient's signature needed at discharge.  Patient to Follow up at:  Follow-up Information     [facility]. Schedule an appointment as soon as possible for a visit in 1 day(s).   Specialty: Behavioral Health Why: Please call to schedule an appointment for psychiatry and counseling services. A referral has been placed on your behalf. Contact information: [address] [facility] [place] [address] [phone]        [facility] Go to.   Why: If symptoms worsen, please present to [facility] for same day, walk-in services.   Monday - Friday 8am-5PM Contact information: [address] [place] [address] [phone]                Next level of care provider has access to [facility] Link:yes  Safety Planning and Suicide Prevention discussed: Yes,  SPE Completed with patient and Delana Meyer, mother.    Has patient been referred to the Quitline?: N/A patient is not a smoker  Patient has been referred for addiction treatment: Pt. refused referral Patient denies substance use, UDS positive for Cannabinoids.   Corky Crafts, LCSWA 06/19/2021, 9:06 AM

## 2021-06-19 NOTE — Progress Notes (Signed)
Patient ID: Tonya Watkins, female   DOB: June 10, 1970, 51 y.o.   MRN: [id number]  Patient denies SI/HI/AVH. Belongings were returned to patient. Discharge instructions  including medication and follow up information were reviewed with patient and understanding was verbalized. Patient was not observed to be in distress at time of discharge. Patient was escorted out with staff to the medical mall to be transported home by parent.

## 2021-06-19 NOTE — Progress Notes (Signed)
Recreation Therapy Notes  INPATIENT RECREATION TR PLAN  Patient Details Name: Tonya Watkins MRN: [id number] DOB: 26-Apr-1971 Today's Date: 06/19/2021  Rec Therapy Plan Is patient appropriate for Therapeutic Recreation?: Yes Treatment times per week: at least 3 Estimated Length of Stay: 5-7 days TR Treatment/Interventions: Group participation (Comment)  Discharge Criteria Pt will be discharged from therapy if:: Discharged Treatment plan/goals/alternatives discussed and agreed upon by:: Patient/family  Discharge Summary Short term goals set: Patient will engage in groups without prompting or encouragement from LRT x3 group sessions within 5 recreation therapy group sessions Short term goals met: Complete Progress toward goals comments: Groups attended Which groups?: Leisure education, Goal setting, Other (Comment) (Time managment) Reason goals not met: N/A Therapeutic equipment acquired: N/A Reason patient discharged from therapy: Discharge from hospital Pt/family agrees with progress & goals achieved: Yes Date patient discharged from therapy: 06/19/21   Kacee Koren 06/19/2021, 12:27 PM

## 2021-06-19 NOTE — Plan of Care (Signed)
°  Problem: Education: Goal: Knowledge of [facility] Education information/materials will improve Outcome: Adequate for Discharge Goal: Emotional status will improve Outcome: Adequate for Discharge Goal: Mental status will improve Outcome: Adequate for Discharge Goal: Verbalization of understanding the information provided will improve Outcome: Adequate for Discharge   Problem: Activity: Goal: Interest or engagement in activities will improve Outcome: Adequate for Discharge Goal: Sleeping patterns will improve Outcome: Adequate for Discharge   Problem: Coping: Goal: Ability to verbalize frustrations and anger appropriately will improve Outcome: Adequate for Discharge Goal: Ability to demonstrate self-control will improve Outcome: Adequate for Discharge   Problem: Health Behavior/Discharge Planning: Goal: Identification of resources available to assist in meeting health care needs will improve Outcome: Adequate for Discharge Goal: Compliance with treatment plan for underlying cause of condition will improve Outcome: Adequate for Discharge   Problem: Physical Regulation: Goal: Ability to maintain clinical measurements within normal limits will improve Outcome: Adequate for Discharge   Problem: Safety: Goal: Periods of time without injury will increase Outcome: Adequate for Discharge   Problem: Group Participation Goal: STG - Patient will engage in groups without prompting or encouragement from LRT x3 group sessions within 5 recreation therapy group sessions Description: STG - Patient will engage in groups without prompting or encouragement from LRT x3 group sessions within 5 recreation therapy group sessions Outcome: Adequate for Discharge

## 2021-06-19 NOTE — Discharge Summary (Signed)
Physician Discharge Summary Note  Patient:  Tonya Watkins is an 51 y.o., female MRN:  [id number] DOB:  [birth year] Patient phone:  [phone] (home)  Patient address:   [address] [place] [address],  Total Time spent with patient: 30 minutes  Date of Admission:  06/07/2021 Date of Discharge: 06/19/2021  Reason for Admission: Patient was admitted because of ongoing psychotic symptoms bizarre behavior and poor self-care at home  Principal Problem: Major depressive disorder, single episode, severe with psychotic features [facility]) Discharge Diagnoses: Principal Problem:   Major depressive disorder, single episode, severe with psychotic features ([place]) Active Problems:   Palpitations   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  Prior to that history of treatment for depression.  Past Medical History:  Past Medical History:  Diagnosis Date   DVT (deep venous thrombosis) (HCC)    left calf   Heart palpitations    Medical history non-contributory    History reviewed. No pertinent surgical history. Family History:  Family History  Problem Relation Age of Onset   Allergies Mother    Cancer Mother        thyroid   Heart block Maternal Grandmother    Family Psychiatric  History: None reported Social History:  Social History   Substance and Sexual Activity  Alcohol Use Not Currently     Social History   Substance and Sexual Activity  Drug Use Not Currently    Social History   Socioeconomic History   Marital status: Divorced    Spouse name: Not on file   Number of children: 3   Years of education: Not on file   Highest education level: Not on file  Occupational History   Occupation: RN  Tobacco Use   Smoking status: Never   Smokeless tobacco: Never  Vaping Use   Vaping Use: Unknown  Substance and Sexual Activity    Alcohol use: Not Currently   Drug use: Not Currently   Sexual activity: Yes  Other Topics Concern   Not on file  Social History Narrative   Not on file   Social Determinants of Health   Financial Resource Strain: Not on file  Food Insecurity: Not on file  Transportation Needs: Not on file  Physical Activity: Not on file  Stress: Not on file  Social Connections: Not on file    Hospital Course: Patient admitted to psychiatric unit because of what appeared to be psychotic symptoms.  Patient was very evasive and hesitant to share symptoms for much of her visit.  Eventually became more forthcoming talking about how she felt like there were mystical parts of her mind that had been opened and were receiving excessive information.  It sounds like what she was describing was a form of auditory hallucinations at times.  She stayed very withdrawn initially.  Despite that she was resistant to medication and resistant to suggestion that she needed treatment.  Eventually patient decompensated to where she became grossly psychotic believing that people had shot an arrow through her head.  At that point forced medicines were initiated.  She has now been on antipsychotic and antidepressant medicine for several days and has shown consistent improvement.  She has had no further outbursts and is able to discuss her symptoms and what appears to be a more honest manner.  Her sleep is better.  She has been counseled repeatedly about the importance  of following up with outpatient treatment and about my diagnosis of her having a major depression with psychosis.  Patient will be given medications at discharge and referred to outpatient follow-up both therapy and medication management.  With her consent I spoke to her mother about this as well.  Physical Findings: AIMS:  , ,  ,  ,    CIWA:    COWS:     Musculoskeletal: Strength & Muscle Tone: within normal limits Gait & Station: normal Patient leans:  N/A   Psychiatric Specialty Exam:  Presentation  General Appearance: Appropriate for Environment  Eye Contact:Fair  Speech:Blocked  Speech Volume:Normal  Handedness:No data recorded  Mood and Affect  Mood:Dysphoric  Affect:Blunt   Thought Process  Thought Processes:Disorganized  Descriptions of Associations:Loose  Orientation:Full (Time, Place and Person)  Thought Content:Illusions  History of Schizophrenia/Schizoaffective disorder:No  Duration of Psychotic Symptoms:Less than six months  Hallucinations:No data recorded Ideas of Reference:-- (unable to assess)  Suicidal Thoughts:No data recorded Homicidal Thoughts:No data recorded  Sensorium  Memory:Immediate Poor  Judgment:Poor  Insight:Poor   Executive Functions  Concentration:Poor  Attention Span:Poor  [place]   Psychomotor Activity  Psychomotor Activity:No data recorded  Assets  Assets:Desire for Improvement; Financial Resources/Insurance; Housing; Resilience; Social Support; Physical Health   Sleep  Sleep:No data recorded   Physical Exam: Physical Exam Vitals and nursing note reviewed.  Constitutional:      Appearance: Normal appearance.  HENT:     Head: Normocephalic and atraumatic.     Mouth/Throat:     Pharynx: Oropharynx is clear.  Eyes:     Pupils: Pupils are equal, round, and reactive to light.  Cardiovascular:     Rate and Rhythm: Normal rate and regular rhythm.  Pulmonary:     Effort: Pulmonary effort is normal.     Breath sounds: Normal breath sounds.  Abdominal:     General: Abdomen is flat.     Palpations: Abdomen is soft.  Musculoskeletal:        General: Normal range of motion.  Skin:    General: Skin is warm and dry.  Neurological:     General: No focal deficit present.     Mental Status: She is alert. Mental status is at baseline.  Psychiatric:        Mood and Affect: Mood normal.        Thought Content:  Thought content normal.   Review of Systems  Constitutional: Negative.   HENT: Negative.    Eyes: Negative.   Respiratory: Negative.    Cardiovascular: Negative.   Gastrointestinal: Negative.   Musculoskeletal: Negative.   Skin: Negative.   Neurological: Negative.   Psychiatric/Behavioral: Negative.    Blood pressure (!) 146/79, pulse 74, temperature 98.5 F (36.9 C), temperature source Oral, resp. rate 18, height 5\' 3"  (1.6 m), weight 68 kg, SpO2 100 %. Body mass index is 26.56 kg/m.   Social History   Tobacco Use  Smoking Status Never  Smokeless Tobacco Never   Tobacco Cessation:  N/A, patient does not currently use tobacco products   Blood Alcohol level:  Lab Results  Component Value Date   ETH <10 123456    Metabolic Disorder Labs:  Lab Results  Component Value Date   HGBA1C 5.5 06/04/2021   MPG 111 06/04/2021   No results found for: PROLACTIN Lab Results  Component Value Date   CHOL 148 06/04/2021   TRIG 31 06/04/2021   HDL 71 06/04/2021   CHOLHDL  2.1 06/04/2021   VLDL 6 06/04/2021   LDLCALC 71 06/04/2021   LDLCALC 76 05/24/2021    See Psychiatric Specialty Exam and Suicide Risk Assessment completed by Attending Physician prior to discharge.  Discharge destination:  Home  Is patient on multiple antipsychotic therapies at discharge:  No   Has Patient had three or more failed trials of antipsychotic monotherapy by history:  No  Recommended Plan for Multiple Antipsychotic Therapies: NA  Discharge Instructions     Diet - low sodium heart healthy   Complete by: As directed    Increase activity slowly   Complete by: As directed       Allergies as of 06/19/2021   No Known Allergies      Medication List     STOP taking these medications    buPROPion 300 MG 24 hr tablet Commonly known as: WELLBUTRIN XL       TAKE these medications      Indication  benztropine 0.5 MG tablet Commonly known as: COGENTIN Take 1 tablet (0.5 mg total)  by mouth at bedtime.  Indication: Extrapyramidal Reaction caused by Medications   hydrOXYzine 50 MG tablet Commonly known as: ATARAX Take 1 tablet (50 mg total) by mouth 3 (three) times daily as needed for anxiety.  Indication: Feeling Anxious   mirtazapine 30 MG tablet Commonly known as: REMERON Take 1 tablet (30 mg total) by mouth at bedtime.  Indication: Major Depressive Disorder   risperiDONE 1 MG disintegrating tablet Commonly known as: RISPERDAL M-TABS Take 1 tablet (1 mg total) by mouth 2 (two) times daily.  Indication: Major Depressive Disorder        Follow-up Information     [facility]. Schedule an appointment as soon as possible for a visit in 1 day(s).   Specialty: Behavioral Health Why: Please call to schedule an appointment for psychiatry and counseling services. A referral has been placed on your behalf. Contact information: [place] [phone]        [place] to.   Why: If symptoms worsen, please present to [facility] for same day, walk-in services.   Monday - Friday 8am-5PM Contact information: [place] [address] [phone]                 Follow-up recommendations: Follow up with local mental health agencies as recommended  Comments: Prescriptions given at discharge  Signed: Alethia Berthold, MD 06/19/2021, 10:03 AM

## 2021-06-19 NOTE — [facility]HH Suicide Risk Assessment (Signed)
[facility]   Principal Problem: Brief reactive psychosis (HCC) Discharge Diagnoses: Principal Problem:   Brief reactive psychosis (HCC) Active Problems:   Palpitations   PVC's (premature ventricular contractions)   Major depressive disorder, single episode, severe with psychotic features (HCC)   Total Time spent with patient: 30 minutes  Musculoskeletal: Strength & Muscle Tone: within normal limits Gait & Station: normal Patient leans: N/A  Psychiatric Specialty Exam  Presentation  General Appearance: Appropriate for Environment  Eye Contact:Fair  Speech:Blocked  Speech Volume:Normal  Handedness:No data recorded  Mood and Affect  Mood:Dysphoric  Duration of Depression Symptoms: Greater than two weeks  Affect:Blunt   Thought Process  Thought Processes:Disorganized  Descriptions of Associations:Loose  Orientation:Full (Time, Place and Person)  Thought Content:Illusions  History of Schizophrenia/Schizoaffective disorder:No  Duration of Psychotic Symptoms:Less than six months  Hallucinations:No data recorded Ideas of Reference:-- (unable to assess)  Suicidal Thoughts:No data recorded Homicidal Thoughts:No data recorded  Sensorium  Memory:Immediate Poor  Judgment:Poor  Insight:Poor   Executive Functions  Concentration:Poor  Attention Span:Poor  Recall:Poor  Fund of Knowledge:Fair  Language:Fair   Psychomotor Activity  Psychomotor Activity:No data recorded  Assets  Assets:Desire for Improvement; Financial Resources/Insurance; Housing; Resilience; Social Support; Physical Health   Sleep  Sleep:No data recorded  Physical Exam: Physical Exam Vitals and nursing note reviewed.  Constitutional:      Appearance: Normal appearance.  HENT:     Head: Normocephalic and atraumatic.     Mouth/Throat:     Pharynx: Oropharynx is clear.  Eyes:     Pupils: Pupils are equal, round, and reactive to light.   Cardiovascular:     Rate and Rhythm: Normal rate and regular rhythm.  Pulmonary:     Effort: Pulmonary effort is normal.     Breath sounds: Normal breath sounds.  Abdominal:     General: Abdomen is flat.     Palpations: Abdomen is soft.  Musculoskeletal:        General: Normal range of motion.  Skin:    General: Skin is warm and dry.  Neurological:     General: No focal deficit present.     Mental Status: She is alert. Mental status is at baseline.  Psychiatric:        Attention and Perception: Attention normal.        Mood and Affect: Mood normal.        Speech: Speech normal.        Behavior: Behavior normal.        Thought Content: Thought content normal.        Cognition and Memory: Cognition normal.        Judgment: Judgment normal.   Review of Systems  Constitutional: Negative.   HENT: Negative.    Eyes: Negative.   Respiratory: Negative.    Cardiovascular: Negative.   Gastrointestinal: Negative.   Musculoskeletal: Negative.   Skin: Negative.   Neurological: Negative.   Psychiatric/Behavioral: Negative.    Blood pressure (!) 146/79, pulse 74, temperature 98.5 F (36.9 C), temperature source Oral, resp. rate 18, height 5\' 3"  (1.6 m), weight 68 kg, SpO2 100 %. Body mass index is 26.56 kg/m.  Mental Status Per Nursing Assessment::   On Admission:  NA  Demographic Factors:  NA  Loss Factors: Loss of significant relationship  Historical Factors: Impulsivity  Risk Reduction Factors:   Responsible for children under 50 years of age, Sense of responsibility to family, Positive social support, and Positive coping skills or problem  solving skills  Continued Clinical Symptoms:  Depression:   Insomnia  Cognitive Features That Contribute To Risk:  None    Suicide Risk:  Minimal: No identifiable suicidal ideation.  Patients presenting with no risk factors but with morbid ruminations; may be classified as minimal risk based on the severity of the depressive  symptoms   Follow-up [facility]. Schedule an appointment as soon as possible for a visit in 1 day(s).   Specialty: Behavioral Health Why: Please call to schedule an appointment for psychiatry and counseling services. A referral has been placed on your behalf. Contact information: [address] [place] [phone]        [place] to.   Why: If symptoms worsen, please present to [facility] for same day, walk-in services.   Monday - Friday 8am-5PM Contact information: [address] [place] [address] [phone]                 Plan Of Care/Follow-up recommendations:  Patient denies any suicidal ideation.  Mood is better.  Psychotic symptoms appear to be resolved.  Much more insightful and agrees to appropriate outpatient treatment and medicine management.  Alethia Berthold, MD 06/19/2021, 9:53 AM

## 2021-06-19 NOTE — Progress Notes (Signed)
Recreation Therapy Notes  Date: 06/19/2021  Time: 10:20 am    Location: Craft room    Behavioral response: Appropriate  Intervention Topic: Time Management    Discussion/Intervention:  Group content today was focused on time management. The group defined time management and identified healthy ways to manage time. Individuals expressed how much of the 24 hours they use in a day. Patients expressed how much time they use just for themselves personally. The group expressed how they have managed their time in the past. Individuals participated in the intervention Managing Life where they had a chance to see how much of the 24 hours they use and where it goes. Clinical Observations/Feedback: Patient came to group and defined time management as how manage your time.   LRT/CTRS         Ruthetta Koopmann 06/19/2021 12:20 PM

## 2021-06-20 ENCOUNTER — Ambulatory Visit (INDEPENDENT_AMBULATORY_CARE_PROVIDER_SITE_OTHER): Payer: 59 | Admitting: Internal Medicine

## 2021-06-20 ENCOUNTER — Encounter: Payer: Self-pay | Admitting: Internal Medicine

## 2021-06-20 ENCOUNTER — Other Ambulatory Visit: Payer: Self-pay

## 2021-06-20 VITALS — BP 116/74 | HR 79 | Temp 98.3°F | Ht 63.0 in | Wt 153.4 lb

## 2021-06-20 DIAGNOSIS — H9313 Tinnitus, bilateral: Secondary | ICD-10-CM

## 2021-06-20 DIAGNOSIS — E6609 Other obesity due to excess calories: Secondary | ICD-10-CM

## 2021-06-20 DIAGNOSIS — F410 Panic disorder [episodic paroxysmal anxiety] without agoraphobia: Secondary | ICD-10-CM

## 2021-06-20 DIAGNOSIS — Z6835 Body mass index (BMI) 35.0-35.9, adult: Secondary | ICD-10-CM

## 2021-06-20 DIAGNOSIS — F32A Depression, unspecified: Secondary | ICD-10-CM

## 2021-06-20 DIAGNOSIS — Z0000 Encounter for general adult medical examination without abnormal findings: Secondary | ICD-10-CM

## 2021-06-20 LAB — URINALYSIS, ROUTINE W REFLEX MICROSCOPIC
Bilirubin Urine: NEGATIVE
Hgb urine dipstick: NEGATIVE
Ketones, ur: NEGATIVE
Nitrite: NEGATIVE
RBC / HPF: NONE SEEN (ref 0–?)
Specific Gravity, Urine: 1.01 (ref 1.000–1.030)
Total Protein, Urine: NEGATIVE
Urine Glucose: NEGATIVE
Urobilinogen, UA: 0.2 (ref 0.0–1.0)
pH: 7.5 (ref 5.0–8.0)

## 2021-06-20 LAB — COMPREHENSIVE METABOLIC PANEL
ALT: 18 U/L (ref 0–35)
AST: 19 U/L (ref 0–37)
Albumin: 4.2 g/dL (ref 3.5–5.2)
Alkaline Phosphatase: 43 U/L (ref 39–117)
BUN: 9 mg/dL (ref 6–23)
CO2: 29 mEq/L (ref 19–32)
Calcium: 9.6 mg/dL (ref 8.4–10.5)
Chloride: 104 mEq/L (ref 96–112)
Creatinine, Ser: 0.79 mg/dL (ref 0.40–1.20)
GFR: 87.72 mL/min (ref >60.00)
Glucose, Bld: 81 mg/dL (ref 70–99)
Potassium: 3.9 mEq/L (ref 3.5–5.1)
Sodium: 141 mEq/L (ref 135–145)
Total Bilirubin: 0.6 mg/dL (ref 0.2–1.2)
Total Protein: 7.5 g/dL (ref 6.0–8.3)

## 2021-06-20 LAB — CBC WITH DIFFERENTIAL/PLATELET
Basophils Absolute: 0 10*3/uL (ref 0.0–0.1)
Basophils Relative: 0.4 % (ref 0.0–3.0)
Eosinophils Absolute: 0.1 10*3/uL (ref 0.0–0.7)
Eosinophils Relative: 1.2 % (ref 0.0–5.0)
HCT: 35.1 % — ABNORMAL LOW (ref 36.0–46.0)
Hemoglobin: 11.6 g/dL — ABNORMAL LOW (ref 12.0–15.0)
Lymphocytes Relative: 23.7 % (ref 12.0–46.0)
Lymphs Abs: 1.1 10*3/uL (ref 0.7–4.0)
MCHC: 33 g/dL (ref 30.0–36.0)
MCV: 93 fl (ref 78.0–100.0)
Monocytes Absolute: 0.5 10*3/uL (ref 0.1–1.0)
Monocytes Relative: 10.6 % (ref 3.0–12.0)
Neutro Abs: 3 10*3/uL (ref 1.4–7.7)
Neutrophils Relative %: 64.1 % (ref 43.0–77.0)
Platelets: 277 10*3/uL (ref 150.0–400.0)
RBC: 3.77 Mil/uL — ABNORMAL LOW (ref 3.87–5.11)
RDW: 12.7 % (ref 11.5–15.5)
WBC: 4.7 10*3/uL (ref 4.0–10.5)

## 2021-06-20 LAB — LIPID PANEL
Cholesterol: 147 mg/dL (ref 0–200)
HDL: 73.1 mg/dL (ref >39.00)
LDL Cholesterol: 65 mg/dL (ref 0–99)
NonHDL: 73.47
Total CHOL/HDL Ratio: 2
Triglycerides: 40 mg/dL (ref 0.0–149.0)
VLDL: 8 mg/dL (ref 0.0–40.0)

## 2021-06-20 LAB — TSH: TSH: 0.85 u[IU]/mL (ref 0.35–5.50)

## 2021-06-20 NOTE — Assessment & Plan Note (Signed)
On Cogentin, Remeron, Atarax, Risperdal

## 2021-06-20 NOTE — Assessment & Plan Note (Addendum)
Recent major depression w/psychosis episod due to high stress at home, breaking up with her fianc in Jan 2023. On Cogentin, Remeron, Atarax, Risperdal Off work On Cogentin, Remeron, Atarax, Risperdal

## 2021-06-20 NOTE — Progress Notes (Signed)
Subjective:  Patient ID: Tonya Watkins, female    DOB: 1971-03-05  Age: 51 y.o. MRN: [id number]  CC: Annual Exam   HPI Tonya Watkins presents for well exam  S/p recent major depression w/psychosis episode due to high stress at home in Jan 2023. She is here with her son. Pt lost wt on diet Complaining of tinnitus -chronic  Outpatient Medications Prior to Visit  Medication Sig Dispense Refill   benztropine (COGENTIN) 0.5 MG tablet Take 1 tablet (0.5 mg total) by mouth at bedtime. 30 tablet 1   hydrOXYzine (ATARAX) 50 MG tablet Take 1 tablet (50 mg total) by mouth 3 (three) times daily as needed for anxiety. 30 tablet 1   mirtazapine (REMERON) 30 MG tablet Take 1 tablet (30 mg total) by mouth at bedtime. 30 tablet 1   risperiDONE (RISPERDAL M-TABS) 1 MG disintegrating tablet Take 1 tablet (1 mg total) by mouth 2 (two) times daily. 60 tablet 1   No facility-administered medications prior to visit.    ROS: Review of Systems  Constitutional:  Positive for fatigue. Negative for activity change, appetite change, chills and unexpected weight change.  HENT:  Positive for tinnitus. Negative for congestion, mouth sores, sinus pressure and voice change.   Eyes:  Negative for visual disturbance.  Respiratory:  Negative for cough and chest tightness.   Gastrointestinal:  Negative for abdominal pain and nausea.  Genitourinary:  Negative for difficulty urinating, frequency and vaginal pain.  Musculoskeletal:  Negative for back pain and gait problem.  Skin:  Negative for pallor and rash.  Neurological:  Negative for dizziness, tremors, weakness, numbness and headaches.  Psychiatric/Behavioral:  Positive for dysphoric mood. Negative for confusion, decreased concentration, sleep disturbance and suicidal ideas. The patient is nervous/anxious.    Objective:  BP 116/74 (BP Location: Left Arm, Patient Position: Sitting, Cuff Size: Normal)    Pulse 79    Temp 98.3 F (36.8 C) (Oral)    Ht 5\' 3"  (1.6  m)    Wt 153 lb 6.4 oz (69.6 kg)    SpO2 99%    BMI 27.17 kg/m   BP Readings from Last 3 Encounters:  06/20/21 116/74  06/07/21 127/86  06/02/21 135/68    Wt Readings from Last 3 Encounters:  06/20/21 153 lb 6.4 oz (69.6 kg)  06/06/21 150 lb (68 kg)  06/01/21 150 lb (68 kg)    Physical Exam Constitutional:      General: She is not in acute distress.    Appearance: She is well-developed.  HENT:     Head: Normocephalic.     Right Ear: External ear normal.     Left Ear: External ear normal.     Nose: Nose normal.  Eyes:     General:        Right eye: No discharge.        Left eye: No discharge.     Conjunctiva/sclera: Conjunctivae normal.     Pupils: Pupils are equal, round, and reactive to light.  Neck:     Thyroid: No thyromegaly.     Vascular: No JVD.     Trachea: No tracheal deviation.  Cardiovascular:     Rate and Rhythm: Normal rate and regular rhythm.     Heart sounds: Normal heart sounds.  Pulmonary:     Effort: No respiratory distress.     Breath sounds: No stridor. No wheezing.  Abdominal:     General: Bowel sounds are normal. There is no distension.  Palpations: Abdomen is soft. There is no mass.     Tenderness: There is no abdominal tenderness. There is no guarding or rebound.  Musculoskeletal:        General: No tenderness.     Cervical back: Normal range of motion and neck supple. No rigidity.  Lymphadenopathy:     Cervical: No cervical adenopathy.  Skin:    Findings: No erythema or rash.  Neurological:     Mental Status: She is oriented to person, place, and time.     Cranial Nerves: No cranial nerve deficit.     Motor: No abnormal muscle tone.     Coordination: Coordination normal.     Deep Tendon Reflexes: Reflexes normal.  Psychiatric:        Behavior: Behavior normal.        Thought Content: Thought content normal.        Judgment: Judgment normal.   I spent 22 minutes in addition to time for CPX wellness examination in preparing to see  the patient by review of recent labs, imaging and procedures, obtaining and reviewing separately obtained history, communicating with the patient, ordering medications, tests or procedures, and documenting clinical information in the EHR including the differential diagnosis, treatment, and any further evaluation and other management of depression, stress.  Hospital records reviewed         Lab Results  Component Value Date   WBC 4.7 06/20/2021   HGB 11.6 (L) 06/20/2021   HCT 35.1 (L) 06/20/2021   PLT 277.0 06/20/2021   GLUCOSE 81 06/20/2021   CHOL 147 06/20/2021   TRIG 40.0 06/20/2021   HDL 73.10 06/20/2021   LDLCALC 65 06/20/2021   ALT 18 06/20/2021   AST 19 06/20/2021   NA 141 06/20/2021   K 3.9 06/20/2021   CL 104 06/20/2021   CREATININE 0.79 06/20/2021   BUN 9 06/20/2021   CO2 29 06/20/2021   TSH 0.85 06/20/2021   INR 2.1 07/03/2012   HGBA1C 5.5 06/04/2021    No results found.  Assessment & Plan:   Problem List Items Addressed This Visit     Depressive disorder    Recent major depression w/psychosis episod due to high stress at home, breaking up with her fianc in Jan 2023. On Cogentin, Remeron, Atarax, Risperdal Off work On Cogentin, Remeron, Atarax, Risperdal      Obesity    The patient has done very well on a low-carb diet with intermittent fasting.  She will continue      PANIC DISORDER    On Cogentin, Remeron, Atarax, Risperdal      Tinnitus    Chronic - ENT ref       Relevant Orders   Ambulatory referral to ENT   Well adult exam - Primary    We discussed age appropriate health related issues, including available/recomended screening tests and vaccinations. We discussed a need for adhering to healthy diet and exercise. Labs were ordered to be later reviewed . All questions were answered. Vegan since  2019  Vit D, MVI GYN q 12 mo Mammo periodically q1-2 years Colonoscopy versus Cologuard discussed      Relevant Orders   TSH   Urinalysis    CBC with Differential/Platelet   Lipid panel   Comprehensive metabolic panel   TSH (Completed)   Urinalysis   CBC with Differential/Platelet (Completed)   Lipid panel (Completed)   Comprehensive metabolic panel (Completed)      No orders of the defined types were placed in  this encounter.     Follow-up: Return in about 3 months (around 09/17/2021) for a follow-up visit.  Walker Kehr, MD

## 2021-06-20 NOTE — Assessment & Plan Note (Signed)
Chronic - ENT ref 

## 2021-06-22 NOTE — Assessment & Plan Note (Signed)
The patient has done very well on a low-carb diet with intermittent fasting.  She will continue

## 2021-06-22 NOTE — Assessment & Plan Note (Addendum)
We discussed age appropriate health related issues, including available/recomended screening tests and vaccinations. We discussed a need for adhering to healthy diet and exercise. Labs were ordered to be later reviewed . All questions were answered. Vegan since  2019  Vit D, MVI GYN q 12 mo Mammo periodically q1-2 years Colonoscopy versus Cologuard discussed

## 2021-07-05 ENCOUNTER — Encounter: Payer: Self-pay | Admitting: Internal Medicine

## 2021-07-20 ENCOUNTER — Encounter: Payer: Self-pay | Admitting: Psychiatry

## 2021-07-20 ENCOUNTER — Other Ambulatory Visit: Payer: Self-pay

## 2021-07-20 ENCOUNTER — Ambulatory Visit (INDEPENDENT_AMBULATORY_CARE_PROVIDER_SITE_OTHER): Payer: Medicaid Other | Admitting: Psychiatry

## 2021-07-20 VITALS — BP 110/72 | HR 72 | Temp 98.5°F | Wt 160.8 lb

## 2021-07-20 DIAGNOSIS — F333 Major depressive disorder, recurrent, severe with psychotic symptoms: Secondary | ICD-10-CM

## 2021-07-20 DIAGNOSIS — Z79899 Other long term (current) drug therapy: Secondary | ICD-10-CM

## 2021-07-20 MED ORDER — MIRTAZAPINE 30 MG PO TABS: 30.0000 mg | ORAL_TABLET | Freq: Every day | ORAL | 1 refills | Status: DC

## 2021-07-20 MED ORDER — BENZTROPINE MESYLATE 0.5 MG PO TABS: 0.5000 mg | ORAL_TABLET | Freq: Every day | ORAL | 1 refills | Status: DC

## 2021-07-20 MED ORDER — HYDROXYZINE HCL 50 MG PO TABS: 50.0000 mg | ORAL_TABLET | Freq: Three times a day (TID) | ORAL | 1 refills | Status: DC | PRN

## 2021-07-20 MED ORDER — RISPERIDONE 1 MG PO TBDP: 1.0000 mg | ORAL_TABLET | Freq: Two times a day (BID) | ORAL | 1 refills | Status: DC

## 2021-07-20 MED ORDER — TRAZODONE HCL 50 MG PO TABS: 25.0000 mg | ORAL_TABLET | Freq: Every evening | ORAL | 1 refills | Status: DC | PRN

## 2021-07-20 NOTE — Progress Notes (Signed)
BH MD OP Progress Note  07/20/2021 12:37 PM Tonya Watkins  MRN:  [id number]  Chief Complaint:  Chief Complaint  Patient presents with   Follow-up: 51 year old African-American female, with recent inpatient mental health admissions x2, presented for medication management.   HPI: Tonya Watkins is a 51 year old African-American female with history of MDD severe with psychosis, is divorced, employed, lives in [place], presented for medication management.  Patient was admitted to inpatient behavioral health unit at [facility]-06/02/2021, discharged on 06/05/2021.  Patient was readmitted on 06/07/2021 and discharged on 06/19/2021.  I have reviewed notes from the inpatient mental health admission-per Dr. Marlou Porch as well as Dr. Toni Amend.  Patient was diagnosed with MDD with psychosis and discharged on risperidone, Remeron, hydroxyzine.  Patient reports she had a lot of psychosocial stressors going on prior to her admission.  She reports she went through a break-up with her ex-boyfriend of 5 years.  Patient reports he was very controlling and she had to ask him to leave.  Patient reports this triggered anxiety and sleep problems.  She reports she went without sleep for several days and then started hallucinating.  She believes her sleep problems may have triggered her hallucination.  Patient reports she heard voices, female voice telling her negative things-like "you are not a good mother" and so on.  She reports she hence got admitted to the hospital.  Patient reports currently she does not have any significant sadness, anhedonia.  She does have sleep problems.  She does not believe the mirtazapine is beneficial for her sleep although it helps to some extent.  Patient is interested in adding a sleep medication.  Has not tried trazodone, agreeable to trial.  Patient does report mild anxiety given her situational stressors.  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  She looks forward to that.  However that does make her anxious since she is going through all these changes.  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  She reports it was mostly remote work and she did not interact with anyone and did not have any social outlet for 5 years.  She is ready for change.  Patient denies any history of trauma.  Patient denies any panic attacks.  Patient denies any history of cannabis abuse, however her urine drug screen was positive on 06/04/2021 for cannabinoids.  When this was discussed with patient she reported that she may have taken some Gummies which are available over-the-counter for her sleep.  She however denies taking anything in January and reports the last time she may have taken them would have been several months ago.  She has no idea how her urine drug screen came back positive for cannabinoids.  Patient reports good support system from her mother who currently stays with her to support her as well as she has her 3 children, 2 adult sons and a 64 year old daughter who lives with her.  Patient denies any other concerns today.  Visit Diagnosis:    ICD-10-CM   1. MDD (major depressive disorder), recurrent, severe, with psychosis (HCC)  F33.3 risperiDONE (RISPERDAL M-TABS) 1 MG disintegrating tablet    mirtazapine (REMERON) 30 MG tablet    hydrOXYzine (ATARAX) 50 MG tablet    benztropine (COGENTIN) 0.5 MG tablet    traZODone (DESYREL) 50 MG tablet    2. High risk medication use  Z79.899  Past Psychiatric History: Patient with most recent inpatient mental health admission at [facility] -06/02/2021 to 06/05/2021, readmitted on 06/06/2021-discharged on 06/19/2021.  Patient denies suicide attempts.  Patient does report a history of being diagnosed with depression by her primary care provider several years ago.  She took Wellbutrin for several years and then it stopped working.  She also tried Lexapro for a couple  of days however did not stay on it.  Past Medical History:  Past Medical History:  Diagnosis Date   Depression    DVT (deep venous thrombosis) (HCC)    left calf   Heart palpitations    Medical history non-contributory    History reviewed. No pertinent surgical history.  Family Psychiatric History: As noted below.  Family History:  Family History  Problem Relation Age of Onset   Allergies Mother    Cancer Mother        thyroid   Heart block Maternal Grandmother    Mental illness Neg Hx     Social History: Patient lives in [place].  Her mother currently lives with her to support her.  Patient has a 32 year old daughter and 2 adult sons-21 and 72 from a previous marriage.  Their father is involved in their care.  Patient is divorced.  She was in a relationship with her ex-boyfriend for 5 years however they recently broke up in December 2022.  Patient has an associate degree, is an Charity fundraiser, is about to start a job at [employer].  Patient denies any history of trauma. Social History   Socioeconomic History   Marital status: Divorced    Spouse name: Not on file   Number of children: 3   Years of education: Not on file   Highest education level: Associate degree: occupational, Scientist, product/process development, or vocational program  Occupational History   Occupation: Charity fundraiser  Tobacco Use   Smoking status: Never   Smokeless tobacco: Never  Vaping Use   Vaping Use: Never used  Substance and Sexual Activity   Alcohol use: Not Currently   Drug use: Not Currently   Sexual activity: Not Currently  Other Topics Concern   Not on file  Social History Narrative   Not on file   Social Determinants of Health   Financial Resource Strain: Not on file  Food Insecurity: Not on file  Transportation Needs: Not on file  Physical Activity: Not on file  Stress: Not on file  Social Connections: Not on file    Allergies: No Known Allergies  Metabolic Disorder Labs: Lab Results  Component Value Date    HGBA1C 5.5 06/04/2021   MPG 111 06/04/2021   No results found for: PROLACTIN Lab Results  Component Value Date   CHOL 147 06/20/2021   TRIG 40.0 06/20/2021   HDL 73.10 06/20/2021   CHOLHDL 2 06/20/2021   VLDL 8.0 06/20/2021   LDLCALC 65 06/20/2021   LDLCALC 71 06/04/2021   Lab Results  Component Value Date   TSH 0.85 06/20/2021   TSH 1.88 05/24/2021    Therapeutic Level Labs: No results found for: LITHIUM No results found for: VALPROATE No components found for:  CBMZ  Current Medications: Current Outpatient Medications  Medication Sig Dispense Refill   traZODone (DESYREL) 50 MG tablet Take 0.5-1 tablets (25-50 mg total) by mouth at bedtime as needed for sleep. 30 tablet 1   benztropine (COGENTIN) 0.5 MG tablet Take 1 tablet (0.5 mg total) by mouth at bedtime. 30 tablet 1   hydrOXYzine (ATARAX) 50 MG tablet Take  1 tablet (50 mg total) by mouth 3 (three) times daily as needed for anxiety. 30 tablet 1   mirtazapine (REMERON) 30 MG tablet Take 1 tablet (30 mg total) by mouth at bedtime. 30 tablet 1   risperiDONE (RISPERDAL M-TABS) 1 MG disintegrating tablet Take 1 tablet (1 mg total) by mouth 2 (two) times daily. 60 tablet 1   No current facility-administered medications for this visit.     Musculoskeletal: Strength & Muscle Tone: within normal limits Gait & Station: normal Patient leans: N/A  Psychiatric Specialty Exam: Review of Systems  Psychiatric/Behavioral:  Positive for sleep disturbance. The patient is nervous/anxious.   All other systems reviewed and are negative.  Blood pressure 110/72, pulse 72, temperature 98.5 F (36.9 C), temperature source Temporal, weight 160 lb 12.8 oz (72.9 kg).Body mass index is 28.48 kg/m.  General Appearance: Casual  Eye Contact:  Fair  Speech:  Clear and Coherent  Volume:  Normal  Mood:  Anxious  Affect:  Congruent  Thought Process:  Goal Directed and Descriptions of Associations: Intact  Orientation:  Full (Time, Place, and  Person)  Thought Content: Logical   Suicidal Thoughts:  No  Homicidal Thoughts:  No  Memory:  Immediate;   Fair Recent;   Fair Remote;   Fair  Judgement:  Fair  Insight:  Fair  Psychomotor Activity:  Normal  Concentration:  Concentration: Fair and Attention Span: Fair  Recall:  [employer] of Knowledge: Fair  Language: Fair  Akathisia:  No  Handed:  Right  AIMS (if indicated): done,0  Assets:  Communication Skills Desire for Improvement Housing Social Support Talents/Skills Transportation Vocational/Educational  ADL's:  Intact  Cognition: WNL  Sleep:  Poor   Screenings: AIMS    Flowsheet Row Office Visit from 07/20/2021 in [facility]  AIMS Total Score 0      AUDIT    Flowsheet Row Admission (Discharged) from 06/07/2021 in [facility] Admission (Discharged) from 06/02/2021 in [facility]  Alcohol Use Disorder Identification Test Final Score (AUDIT) 0 0      GAD-7    Flowsheet Row Office Visit from 07/20/2021 in [facility] Office Visit from 03/24/2017 in [facility]  Total GAD-7 Score 0 2      PHQ2-9    Flowsheet Row Office Visit from 07/20/2021 in [facility] Office Visit from 06/20/2021 in [facility] Visit from 12/05/2020 in [facility] Visit from 12/16/2019 in [facility] Visit from 08/15/2017 in Primary Care at [facility] Total Score 0 1 0 0 0  PHQ-9 Total Score 3 -- 0 0 --      Flowsheet Row Office Visit from 07/20/2021 in [facility] Admission (Discharged) from 06/07/2021 in [facility] INPATIENT BEHAVIORAL MEDICINE ED from 06/06/2021 in [facility]  C-SSRS RISK CATEGORY No Risk No Risk No Risk        Assessment and Plan: Tonya Watkins is a  51 year old African-American female, has a history of MDD with psychosis, recent admission to inpatient mental health admission at [facility], presented for medication management.  Patient is currently improving however continues to have sleep problems.  Will benefit from the following plan. The patient demonstrates the following risk factors for suicide: Chronic risk factors for suicide include: psychiatric disorder of depression . Acute risk factors for suicide include: family or marital conflict and  loss (financial, interpersonal, professional). Protective factors for this patient include: positive social support, positive therapeutic relationship, responsibility to others (children, family), and coping skills. Considering these factors, the overall suicide risk at this point appears to be low. Patient is appropriate for outpatient follow up.  Plan MDD with psychosis-improving Continue risperidone 1 mg p.o. twice daily. Continue mirtazapine 30 mg p.o. nightly Continue benztropine 0.5 mg at bedtime however advised her to use it as needed Hydroxyzine 50 mg p.o. 3 times daily as needed Start trazodone 25-50 mg p.o. nightly as needed for sleep.  High risk medication use-reviewed and discussed the following labs including urine drug screen-dated 06/06/2021-positive for cannabinoids, TSH dated 06/20/2021-within normal limits, lipid panel-within normal limits, AST ALT-within normal limits, hemoglobin A1c-within normal limits.  Reviewed EKG-dated 06/04/2021-normal sinus rhythm.  QTc-444.  Reviewed inpatient admission notes dated1/21/2023, discharged on 06/05/2021,readmitted on 06/07/2021 and discharged on 06/19/2021-as noted above.  Follow-up in clinic in 4 weeks or sooner if needed.      Collaboration of Care: Collaboration of Care: Referral or follow-up with counselor/therapist AEB patient encouraged to keep her appointment with therapist at our practice-has an appointment coming up.  Patient/Guardian was  advised Release of Information must be obtained prior to any record release in order to collaborate their care with an outside provider. Patient/Guardian was advised if they have not already done so to contact the registration department to sign all necessary forms in order for [place] to release information regarding their care.   Consent: Patient/Guardian gives verbal consent for treatment and assignment of benefits for services provided during this visit. Patient/Guardian expressed understanding and agreed to proceed.    Jomarie Longs, MD 07/20/2021, 12:37 PM

## 2021-07-20 NOTE — Patient Instructions (Signed)
Trazodone Tablets °What is this medication? °TRAZODONE (TRAZ oh done) treats depression. It increases the amount of serotonin in the brain, a hormone that helps regulate mood. °This medicine may be used for other purposes; ask your health care provider or pharmacist if you have questions. °COMMON BRAND NAME(S): Desyrel °What should I tell my care team before I take this medication? °They need to know if you have any of these conditions: °Attempted suicide or thinking about it °Bipolar disorder °Bleeding problems °Glaucoma °Heart disease, or previous heart attack °Irregular heart beat °Kidney or liver disease °Low levels of sodium in the blood °An unusual or allergic reaction to trazodone, other medications, foods, dyes or preservatives °Pregnant or trying to get pregnant °Breast-feeding °How should I use this medication? °Take this medication by mouth with a glass of water. Follow the directions on the prescription label. Take this medication shortly after a meal or a light snack. Take your medication at regular intervals. Do not take your medication more often than directed. Do not stop taking this medication suddenly except upon the advice of your care team. Stopping this medication too quickly may cause serious side effects or your condition may worsen. °A special MedGuide will be given to you by the pharmacist with each prescription and refill. Be sure to read this information carefully each time. °Talk to your care team regarding the use of this medication in children. Special care may be needed. °Overdosage: If you think you have taken too much of this medicine contact a poison control center or emergency room at once. °NOTE: This medicine is only for you. Do not share this medicine with others. °What if I miss a dose? °If you miss a dose, take it as soon as you can. If it is almost time for your next dose, take only that dose. Do not take double or extra doses. °What may interact with this medication? °Do not  take this medication with any of the following: °Certain medications for fungal infections like fluconazole, itraconazole, ketoconazole, posaconazole, voriconazole °Cisapride °Dronedarone °Linezolid °MAOIs like Carbex, Eldepryl, Marplan, Nardil, and Parnate °Mesoridazine °Methylene blue (injected into a vein) °Pimozide °Saquinavir °Thioridazine °This medication may also interact with the following: °Alcohol °Antiviral medications for HIV or AIDS °Aspirin and aspirin-like medications °Barbiturates like phenobarbital °Certain medications for blood pressure, heart disease, irregular heart beat °Certain medications for depression, anxiety, or psychotic disturbances °Certain medications for migraine headache like almotriptan, eletriptan, frovatriptan, naratriptan, rizatriptan, sumatriptan, zolmitriptan °Certain medications for seizures like carbamazepine and phenytoin °Certain medications for sleep °Certain medications that treat or prevent blood clots like dalteparin, enoxaparin, warfarin °Digoxin °Fentanyl °Lithium °NSAIDS, medications for pain and inflammation, like ibuprofen or naproxen °Other medications that prolong the QT interval (cause an abnormal heart rhythm) like dofetilide °Rasagiline °Supplements like St. John's wort, kava kava, valerian °Tramadol °Tryptophan °This list may not describe all possible interactions. Give your health care provider a list of all the medicines, herbs, non-prescription drugs, or dietary supplements you use. Also tell them if you smoke, drink alcohol, or use illegal drugs. Some items may interact with your medicine. °What should I watch for while using this medication? °Tell your care team if your symptoms do not get better or if they get worse. Visit your care team for regular checks on your progress. Because it may take several weeks to see the full effects of this medication, it is important to continue your treatment as prescribed by your care team. °Watch for new or worsening  thoughts of   suicide or depression. This includes sudden changes in mood, behaviors, or thoughts. These changes can happen at any time but are more common in the beginning of treatment or after a change in dose. Call your care team right away if you experience these thoughts or worsening depression. °Manic episodes may happen in patients with bipolar disorder who take this medication. Watch for changes in feelings or behaviors such as feeling anxious, nervous, agitated, panicky, irritable, hostile, aggressive, impulsive, severely restless, overly excited and hyperactive, or trouble sleeping. These changes can happen at any time but are more common in the beginning of treatment or after a change in dose. Call your care team right away if you notice any of these symptoms. °You may get drowsy or dizzy. Do not drive, use machinery, or do anything that needs mental alertness until you know how this medication affects you. Do not stand or sit up quickly, especially if you are an older patient. This reduces the risk of dizzy or fainting spells. Alcohol may interfere with the effect of this medication. Avoid alcoholic drinks. °This medication may cause dry eyes and blurred vision. If you wear contact lenses you may feel some discomfort. Lubricating drops may help. See your eye doctor if the problem does not go away or is severe. °Your mouth may get dry. Chewing sugarless gum, sucking hard candy and drinking plenty of water may help. Contact your care team if the problem does not go away or is severe. °What side effects may I notice from receiving this medication? °Side effects that you should report to your care team as soon as possible: °Allergic reactions--skin rash, itching, hives, swelling of the face, lips, tongue, or throat °Bleeding--bloody or black, tar-like stools, red or dark brown urine, vomiting blood or brown material that looks like coffee grounds, small, red or purple spots on skin, unusual bleeding or  bruising °Heart rhythm changes--fast or irregular heartbeat, dizziness, feeling faint or lightheaded, chest pain, trouble breathing °Low blood pressure--dizziness, feeling faint or lightheaded, blurry vision °Low sodium level--muscle weakness, fatigue, dizziness, headache, confusion °Prolonged or painful erection °Serotonin syndrome--irritability, confusion, fast or irregular heartbeat, muscle stiffness, twitching muscles, sweating, high fever, seizures, chills, vomiting, diarrhea °Sudden eye pain or change in vision such as blurry vision, seeing halos around lights, vision loss °Thoughts of suicide or self-harm, worsening mood, feelings of depression °Side effects that usually do not require medical attention (report to your care team if they continue or are bothersome): °Change in sex drive or performance °Constipation °Dizziness °Drowsiness °Dry mouth °This list may not describe all possible side effects. Call your doctor for medical advice about side effects. You may report side effects to FDA at 1-800-FDA-1088. °Where should I keep my medication? °Keep out of the reach of children and pets. °Store at room temperature between 15 and 30 degrees C (59 to 86 degrees F). Protect from light. Keep container tightly closed. Throw away any unused medication after the expiration date. °NOTE: This sheet is a summary. It may not cover all possible information. If you have questions about this medicine, talk to your doctor, pharmacist, or health care provider. °© 2022 Elsevier/Gold Standard (2020-04-19 00:00:00) ° °

## 2021-07-30 ENCOUNTER — Ambulatory Visit: Payer: 59 | Admitting: Licensed Clinical Social Worker

## 2021-08-03 ENCOUNTER — Telehealth: Payer: Self-pay

## 2021-08-03 NOTE — Telephone Encounter (Signed)
Attempted to contact patient, had to leave a voicemail. 

## 2021-08-06 NOTE — Telephone Encounter (Signed)
Closed note patient did not return call ? ?

## 2021-08-11 ENCOUNTER — Encounter: Payer: Self-pay | Admitting: Internal Medicine

## 2021-08-14 ENCOUNTER — Telehealth: Payer: Self-pay | Admitting: Internal Medicine

## 2021-08-14 NOTE — Telephone Encounter (Signed)
Pt checking response status of mychart message that reads ? ?"Good evening Dr. Alain Marion, ?  ?I have been having trouble sleeping. My psychiatrist prescribed Trazodone and it is not helping. Can you prescribe something else for me that will help me sleep better?  ?  ?Thanks, ?Thresea Kagel" ? ? ?Pt is requesting a cb ?

## 2021-08-15 ENCOUNTER — Other Ambulatory Visit: Payer: Self-pay | Admitting: Psychiatry

## 2021-08-15 ENCOUNTER — Telehealth: Payer: Self-pay

## 2021-08-15 NOTE — Telephone Encounter (Signed)
Called the patient. She gained ten pounds since being on risperidone and mirtazapine.  She discontinued mirtazapine, reportedly after talking with Dr. Elna Breslow. She also discontinued risperidone. She has bene taking  bupropion 300 mg daily for the past two weeks, along with phentermine without side effect. She has been on this combination for many years. She is unsure why bupropion was discontinued during the admission. She restarted risperidone yesterday. She called our office to make sure if can take bupropion along with risperidone. She states that her mood is down at times. She has occasional AH of voices. She denies SI. She denies decreased need for sleep, euphoria. She agrees with the following.  ?- Continue bupropion 300 mg daily  ?- Continue risperidone 1 mg twice a day  ?- hold mirtazapine (discontinued a few weeks ago) ?- She has an upcoming appointment with Dr. Elna Breslow in two weeks. Will forward this note to Dr. Elna Breslow so that she is aware.

## 2021-08-15 NOTE — Telephone Encounter (Signed)
pt states that she like to speak with the doctor about her medications. she stated that she stopped the mirtazapine because of the weight gain but she wants to know if she can do wellbutrin she like to discuss her options. ?

## 2021-08-18 NOTE — Telephone Encounter (Signed)
I would prefer if this problem is addressed by [facility] psychiatrist.  In the meantime she can increase trazodone to 100 mg at night. ?Thanks ?

## 2021-08-20 NOTE — Telephone Encounter (Signed)
I was able to speak with the pt and give Dr. Loren Racer instructions. ? ?Pt states she understands and will f/u with her psychiatrist. She will try to up her dosage and see if this works. ?

## 2021-08-20 NOTE — Telephone Encounter (Signed)
Duplicate request.. see phone note. Closing encounter..Shearon Stalls ?

## 2021-08-27 ENCOUNTER — Other Ambulatory Visit: Payer: Self-pay | Admitting: Internal Medicine

## 2021-08-27 NOTE — Telephone Encounter (Signed)
Rec'd msg "The original prescription was discontinued on 06/19/2021 by Clapacs, Jackquline Denmark, MD ". Pls advise on refill.Marland KitchenRaechel Chute ?

## 2021-08-30 ENCOUNTER — Ambulatory Visit: Payer: Medicaid Other | Admitting: Psychiatry

## 2021-08-31 ENCOUNTER — Other Ambulatory Visit: Payer: Self-pay | Admitting: Internal Medicine

## 2021-08-31 ENCOUNTER — Encounter: Payer: Self-pay | Admitting: Internal Medicine

## 2021-09-01 ENCOUNTER — Encounter: Payer: Self-pay | Admitting: Internal Medicine

## 2021-09-03 ENCOUNTER — Telehealth: Payer: Self-pay

## 2021-09-03 DIAGNOSIS — F333 Major depressive disorder, recurrent, severe with psychotic symptoms: Secondary | ICD-10-CM

## 2021-09-03 MED ORDER — BUPROPION HCL ER (XL) 300 MG PO TB24: 300.0000 mg | ORAL_TABLET | Freq: Every day | ORAL | 0 refills | Status: DC

## 2021-09-03 NOTE — Telephone Encounter (Signed)
Rec'd msg stating  ?

## 2021-09-03 NOTE — Telephone Encounter (Signed)
pt called states she not taking the mirtazapine and that she needs a refill on the wellbutin xl 300mg   ?

## 2021-09-03 NOTE — Telephone Encounter (Signed)
I have sent Wellbutrin-30-day supply to pharmacy. ?

## 2021-09-07 ENCOUNTER — Telehealth: Payer: Self-pay

## 2021-09-07 NOTE — Telephone Encounter (Signed)
received fax from [email or web address] that a prior auth was needed for the risperidone.  ?

## 2021-09-07 NOTE — Telephone Encounter (Signed)
went online to covermymeds and submitted the prior auth - pending 

## 2021-09-11 NOTE — Telephone Encounter (Signed)
risperidone  approved from 09-07-21 to 09-08-22  pa case # [id number] ?

## 2021-09-12 ENCOUNTER — Ambulatory Visit (INDEPENDENT_AMBULATORY_CARE_PROVIDER_SITE_OTHER): Payer: Self-pay | Admitting: Licensed Clinical Social Worker

## 2021-09-12 ENCOUNTER — Telehealth: Payer: Self-pay | Admitting: Licensed Clinical Social Worker

## 2021-09-12 DIAGNOSIS — Z91199 Patient's noncompliance with other medical treatment and regimen due to unspecified reason: Secondary | ICD-10-CM

## 2021-09-12 NOTE — Telephone Encounter (Signed)
LCSW counselor tried to connect with patient for scheduled appointment via MyChart video text request x 2 and email request; also attempted to connect via phone without success.  ? ?Attempt 1: Text and email: 8:08a ? ?Attempt 2: Text and email: 8:11a ? ?Attempt 3: phone call: 8:15a.  Attempted to leave voicemail but mailbox is full ? ?

## 2021-09-12 NOTE — Progress Notes (Signed)
LCSW counselor tried to connect with patient for scheduled appointment via MyChart video text request x 2 and email request; also attempted to connect via phone without success.  ? ?Attempt 1: Text and email: 8:08a ? ?Attempt 2: Text and email: 8:11a ? ?Attempt 3: phone call: 8:15a.  Attempted to leave voicemail but mailbox is full ? ?

## 2021-09-18 ENCOUNTER — Ambulatory Visit: Payer: Self-pay | Admitting: Internal Medicine

## 2021-09-20 ENCOUNTER — Encounter: Payer: Self-pay | Admitting: Psychiatry

## 2021-09-20 ENCOUNTER — Telehealth: Payer: Self-pay

## 2021-09-20 ENCOUNTER — Telehealth (INDEPENDENT_AMBULATORY_CARE_PROVIDER_SITE_OTHER): Payer: Commercial Managed Care - PPO | Admitting: Psychiatry

## 2021-09-20 DIAGNOSIS — F3342 Major depressive disorder, recurrent, in full remission: Secondary | ICD-10-CM

## 2021-09-20 DIAGNOSIS — G4700 Insomnia, unspecified: Secondary | ICD-10-CM

## 2021-09-20 DIAGNOSIS — F3341 Major depressive disorder, recurrent, in partial remission: Secondary | ICD-10-CM | POA: Insufficient documentation

## 2021-09-20 MED ORDER — RISPERIDONE 1 MG PO TBDP
1.0000 mg | ORAL_TABLET | Freq: Two times a day (BID) | ORAL | 0 refills | Status: DC
  Filled 2021-10-09: qty 60, 30d supply, fill #0

## 2021-09-20 MED ORDER — BUPROPION HCL ER (XL) 300 MG PO TB24
300.0000 mg | ORAL_TABLET | Freq: Every day | ORAL | 2 refills | Status: DC
  Filled 2021-11-02 (×2): qty 30, 30d supply, fill #0

## 2021-09-20 MED ORDER — TRAZODONE HCL 50 MG PO TABS
25.0000 mg | ORAL_TABLET | Freq: Every evening | ORAL | 2 refills | Status: DC | PRN
  Filled 2021-11-02 (×2): qty 30, 30d supply, fill #0

## 2021-09-20 MED ORDER — BENZTROPINE MESYLATE 0.5 MG PO TABS
0.5000 mg | ORAL_TABLET | Freq: Every evening | ORAL | 1 refills | Status: DC | PRN
  Filled 2021-11-02 (×2): qty 30, 30d supply, fill #0

## 2021-09-20 NOTE — Telephone Encounter (Signed)
Attempted to contact patient to get some clarification about her symptoms.  Had to leave a voicemail. ?

## 2021-09-20 NOTE — Progress Notes (Signed)
Virtual Visit via Video Note ? ?I connected with Tonya Watkins on 09/20/21 at  2:30 PM EDT by a video enabled telemedicine application and verified that I am speaking with the correct person using two identifiers. ? ?Location ?Provider Location : [facility] ?Patient Location : Work ? ?Participants: Patient , Provider ? ?  ?I discussed the limitations of evaluation and management by telemedicine and the availability of in person appointments. The patient expressed understanding and agreed to proceed. ?  ?I discussed the assessment and treatment plan with the patient. The patient was provided an opportunity to ask questions and all were answered. The patient agreed with the plan and demonstrated an understanding of the instructions. ?  ?The patient was advised to call back or seek an in-person evaluation if the symptoms worsen or if the condition fails to improve as anticipated. ? ? ? ?[place] MD OP Progress Note ? ?09/20/2021 3:01 PM ?[place]  ?MRN:  [id number] ? ?Chief Complaint:  ?Chief Complaint  ?Patient presents with  ? Follow-up: 51 year old African-American female with history of depression, recent sleep problems, presented for medication management.  ? ?HPI: Tonya Watkins is a 51 year old African-American female, divorced, employed, lives in [place], with history of MDD, presented for medication management. ? ?Patient today reports she started working in March.  Patient reports that so far work is going well.  She works 12-hour shifts, 3 days a week.  So far she has been managing okay. ? ?Denies any significant sadness, crying spells.  Reports appetite is fair. ? ?She does struggle with tiredness especially on the days that she works.  She however reports her schedule is set up in such a way that she does get a break in between and that seems to help. ? ?Patient reports sleep as restless.  Patient reports the days that she works she gets home only around 8 or 8:30 PM.  She reports she tries to sleep by around 10  PM.  She has to be up around 5 AM.  She hence does not get enough rest.  She tries to catch up on her sleep on the days that she does not have to work.  She could not take the mirtazapine due to concerns of weight gain.  She does have trazodone available and that seems to help. ? ?Patient is compliant on her medications including risperidone.  She however reports she may have had abnormal movements of her tongue once a few days ago.  That did not last long and she has not had it since.  She has been using Cogentin.  Patient also does report dry mouth likely due to combination of her medications including benztropine or Cogentin. ? ?Patient denies any suicidality, homicidality or perceptual disturbances. ? ?Patient denies any other concerns today. ? ?Visit Diagnosis:  ?  ICD-10-CM   ?1. MDD (major depressive disorder), recurrent, in full remission ([place])  F33.42 risperiDONE (RISPERDAL M-TABS) 1 MG disintegrating tablet  ?  traZODone (DESYREL) 50 MG tablet  ?  buPROPion (WELLBUTRIN XL) 300 MG 24 hr tablet  ?  benztropine (COGENTIN) 0.5 MG tablet  ?  ?2. Insomnia, unspecified type  G47.00   ?  ? ? ?Past Psychiatric History: Reviewed past psychiatric history from progress note on 07/20/2021.  Past trials of medications like Lexapro. ? ?Past Medical History:  ?Past Medical History:  ?Diagnosis Date  ? Depression   ? DVT (deep venous thrombosis) ([place])   ? left calf  ? Heart palpitations   ? Medical history  non-contributory   ? History reviewed. No pertinent surgical history. ? ?Family Psychiatric History: Reviewed family psychiatric history from progress note on 07/20/2021. ? ?Family History:  ?Family History  ?Problem Relation Age of Onset  ? Allergies Mother   ? Cancer Mother   ?     thyroid  ? Heart block Maternal Grandmother   ? Mental illness Neg Hx   ? ? ?Social History: Reviewed social history from progress note on 07/20/2021. ?Social History  ? ?Socioeconomic History  ? Marital status: Divorced  ?  Spouse name: Not on  file  ? Number of children: 3  ? Years of education: Not on file  ? Highest education level: Associate degree: occupational, Hotel manager, or vocational program  ?Occupational History  ? Occupation: Therapist, sports  ?Tobacco Use  ? Smoking status: Never  ? Smokeless tobacco: Never  ?Vaping Use  ? Vaping Use: Never used  ?Substance and Sexual Activity  ? Alcohol use: Not Currently  ? Drug use: Not Currently  ? Sexual activity: Not Currently  ?Other Topics Concern  ? Not on file  ?Social History Narrative  ? Not on file  ? ?Social Determinants of Health  ? ?Financial Resource Strain: Not on file  ?Food Insecurity: Not on file  ?Transportation Needs: Not on file  ?Physical Activity: Not on file  ?Stress: Not on file  ?Social Connections: Not on file  ? ? ?Allergies:  ?Allergies  ?Allergen Reactions  ? Mirtazapine   ?  Weight gain  ? ? ?Metabolic Disorder Labs: ?Lab Results  ?Component Value Date  ? HGBA1C 5.5 06/04/2021  ? MPG 111 06/04/2021  ? ?No results found for: PROLACTIN ?Lab Results  ?Component Value Date  ? CHOL 147 06/20/2021  ? TRIG 40.0 06/20/2021  ? HDL 73.10 06/20/2021  ? CHOLHDL 2 06/20/2021  ? VLDL 8.0 06/20/2021  ? [place] 65 06/20/2021  ? [place] 71 06/04/2021  ? ?Lab Results  ?Component Value Date  ? TSH 0.85 06/20/2021  ? TSH 1.88 05/24/2021  ? ? ?Therapeutic Level Labs: ?No results found for: LITHIUM ?No results found for: VALPROATE ?No components found for:  CBMZ ? ?Current Medications: ?Current Outpatient Medications  ?Medication Sig Dispense Refill  ? phentermine (ADIPEX-P) 37.5 MG tablet Take 37.5 mg by mouth every morning.    ? benztropine (COGENTIN) 0.5 MG tablet Take 1 tablet (0.5 mg total) by mouth at bedtime as needed for tremors. 30 tablet 1  ? buPROPion (WELLBUTRIN XL) 300 MG 24 hr tablet Take 1 tablet (300 mg total) by mouth daily. 30 tablet 2  ? hydrOXYzine (ATARAX) 50 MG tablet Take 1 tablet (50 mg total) by mouth 3 (three) times daily as needed for anxiety. (Patient not taking: Reported on 09/20/2021)  30 tablet 1  ? risperiDONE (RISPERDAL M-TABS) 1 MG disintegrating tablet Take 1 tablet (1 mg total) by mouth 2 (two) times daily. 60 tablet 0  ? traZODone (DESYREL) 50 MG tablet Take 0.5-1 tablets (25-50 mg total) by mouth at bedtime as needed for sleep. 30 tablet 2  ? ?No current facility-administered medications for this visit.  ? ? ? ?Musculoskeletal: ?Strength & Muscle Tone:  UTA ?Gait & Station: normal ?Patient leans: N/A ? ?Psychiatric Specialty Exam: ?Review of Systems  ?HENT:    ?     Dry mouth  ?Psychiatric/Behavioral:  Positive for sleep disturbance. The patient is nervous/anxious.   ?All other systems reviewed and are negative.  ?There were no vitals taken for this visit.There is no height or weight  on file to calculate BMI.  ?General Appearance: Casual  ?Eye Contact:  Fair  ?Speech:  Clear and Coherent  ?Volume:  Normal  ?Mood:  Anxious  ?Affect:  Congruent  ?Thought Process:  Goal Directed and Descriptions of Associations: Intact  ?Orientation:  Full (Time, Place, and Person)  ?Thought Content: Logical   ?Suicidal Thoughts:  No  ?Homicidal Thoughts:  No  ?Memory:  Immediate;   Fair ?Recent;   Fair ?Remote;   Fair  ?Judgement:  Fair  ?Insight:  Fair  ?Psychomotor Activity:  Normal  ?Concentration:  Concentration: Fair and Attention Span: Fair  ?Recall:  Fair  ?Fund of Knowledge: Fair  ?Language: Fair  ?Akathisia:  No  ?Handed:  Right  ?AIMS (if indicated): done  ?Assets:  Communication Skills ?Desire for Improvement ?Housing ?Social Support ?Talents/Skills ?Transportation ?Vocational/Educational  ?ADL's:  Intact  ?Cognition: WNL  ?Sleep:   Restless  ? ?Screenings: ?AIMS   ? ?Flowsheet Row Video Visit from 09/20/2021 in [facility] Office Visit from 07/20/2021 in [facility]  ?AIMS Total Score 3 0  ? ?  ? ?AUDIT   ? ?Flowsheet Row Admission (Discharged) from 06/07/2021 in [facility] Admission (Discharged) from 06/02/2021 in [facility]  ?Alcohol Use Disorder Identification Test Final Score (AUDIT) 0 0  ? ?  ? ?GAD-7   ? ?Flowsheet Row Video Visit from 09/20/2021 in [facility]

## 2021-09-20 NOTE — Telephone Encounter (Signed)
pt states she did want to tell you that she is hearing negative voices sometimes and she just wanted to let you know.  ?

## 2021-09-24 NOTE — Telephone Encounter (Signed)
Attempted to contact patient again, looks like patient has been playing phone tag, not responding when writer attempts to call her back. ? ?Since patient is currently symptomatic and may need medication management, will have to staff here contact her to schedule a sooner appointment. ? ?I have also left a voicemail for patient to contact the clinic and left voicemail about the need for a sooner appointment for medication management. ?

## 2021-09-24 NOTE — Telephone Encounter (Signed)
left message to call office back.  

## 2021-09-24 NOTE — Telephone Encounter (Signed)
left message to call office back to set up an appt.  ?

## 2021-09-24 NOTE — Telephone Encounter (Signed)
pt called back she states she is hearing voices that are not her own.  please call her she needs something else.  ?

## 2021-10-02 ENCOUNTER — Encounter: Payer: Self-pay | Admitting: Psychiatry

## 2021-10-02 ENCOUNTER — Telehealth (INDEPENDENT_AMBULATORY_CARE_PROVIDER_SITE_OTHER): Payer: Commercial Managed Care - PPO | Admitting: Psychiatry

## 2021-10-02 DIAGNOSIS — F333 Major depressive disorder, recurrent, severe with psychotic symptoms: Secondary | ICD-10-CM | POA: Diagnosis not present

## 2021-10-02 MED ORDER — RISPERIDONE 0.5 MG PO TBDP: 0.5000 mg | ORAL_TABLET | Freq: Every day | ORAL | 1 refills | Status: DC

## 2021-10-02 NOTE — Progress Notes (Unsigned)
Virtual Visit via Video Note  I connected with Tonya Watkins on 10/02/21 at  4:00 PM EDT by a video enabled telemedicine application and verified that I am speaking with the correct person using two identifiers.  Location Provider Location : [facility] Patient Location : Home  Participants: Patient , Provider    I discussed the limitations of evaluation and management by telemedicine and the availability of in person appointments. The patient expressed understanding and agreed to proceed   I discussed the assessment and treatment plan with the patient. The patient was provided an opportunity to ask questions and all were answered. The patient agreed with the plan and demonstrated an understanding of the instructions.   The patient was advised to call back or seek an in-person evaluation if the symptoms worsen or if the condition fails to improve as anticipated.   [facility] MD OP Progress Note  10/02/2021 4:33 PM Tonya Watkins  MRN:  [id number]  Chief Complaint:  Chief Complaint  Patient presents with   Follow-up: 51 year old African-American female with history of depression, insomnia, presented for medication management.   HPI: Tonya Watkins is a 51 year old African-American female with history of MDD, divorced, employed, lives in [place], presented for medication management.  Patient today reports she has been having auditory hallucinations since the past 5 to 6 months.  Patient reports she did not talk about this at her session with writer last time however would like to be honest about it this time.  She has had it prior to her inpatient mental health admission.  At that time she may have had more than 1 voice in her head.  Currently she only has 1 voice and it communicates with her.  Patient believes the voices are able to communicate with her subconsciously.  She reports majority of the time it does not bother her.  Patient reports the voices as negative most of the time.  However she has  learned to ignore it.  Patient reports it does not affect her work since she is able to ignore it.  Patient denies being depressed.  Patient reports she is able to function okay at work, able to take care of herself.  Feels motivated to do so.  Does report anxiety about the lingering voices.  Patient is compliant on medications.  Denies side effects.  Denies suicidality or homicidality.  Patient denies any other concerns today.  Visit Diagnosis: R/O Schizophrenia   ICD-10-CM   1. MDD (major depressive disorder), recurrent, severe, with psychosis ([facility])  F33.3 risperiDONE (RISPERDAL M-TABS) 0.5 MG disintegrating tablet      Past Psychiatric History: Reviewed past psychiatric history from progress note on 07/20/2021.  Past Medical History:  Past Medical History:  Diagnosis Date   Depression    DVT (deep venous thrombosis) ([facility])    left calf   Heart palpitations    Medical history non-contributory    History reviewed. No pertinent surgical history.  Family Psychiatric History: Reviewed family psychiatric history from progress note on 07/20/2021.  Family History:  Family History  Problem Relation Age of Onset   Allergies Mother    Cancer Mother        thyroid   Heart block Maternal Grandmother    Mental illness Neg Hx     Social History: Reviewed social history from progress note on 07/20/2021. Social History   Socioeconomic History   Marital status: Divorced    Spouse name: Not on file   Number of children: 3   Years of  education: Not on file   Highest education level: Associate degree: occupational, Scientist, product/process developmenttechnical, or vocational program  Occupational History   Occupation: RN  Tobacco Use   Smoking status: Never   Smokeless tobacco: Never  Vaping Use   Vaping Use: Never used  Substance and Sexual Activity   Alcohol use: Not Currently   Drug use: Not Currently   Sexual activity: Not Currently  Other Topics Concern   Not on file  Social History Narrative   Not on  file   Social Determinants of Health   Financial Resource Strain: Not on file  Food Insecurity: Not on file  Transportation Needs: Not on file  Physical Activity: Not on file  Stress: Not on file  Social Connections: Not on file    Allergies:  Allergies  Allergen Reactions   Mirtazapine     Weight gain    Metabolic Disorder Labs: Lab Results  Component Value Date   HGBA1C 5.5 06/04/2021   MPG 111 06/04/2021   No results found for: PROLACTIN Lab Results  Component Value Date   CHOL 147 06/20/2021   TRIG 40.0 06/20/2021   HDL 73.10 06/20/2021   CHOLHDL 2 06/20/2021   VLDL 8.0 06/20/2021   LDLCALC 65 06/20/2021   LDLCALC 71 06/04/2021   Lab Results  Component Value Date   TSH 0.85 06/20/2021   TSH 1.88 05/24/2021    Therapeutic Level Labs: No results found for: LITHIUM No results found for: VALPROATE No components found for:  CBMZ  Current Medications: Current Outpatient Medications  Medication Sig Dispense Refill   risperiDONE (RISPERDAL M-TABS) 0.5 MG disintegrating tablet Take 1 tablet (0.5 mg total) by mouth at bedtime. Take along with 1 mg at bedtime 30 tablet 1   benztropine (COGENTIN) 0.5 MG tablet Take 1 tablet (0.5 mg total) by mouth at bedtime as needed for tremors. 30 tablet 1   buPROPion (WELLBUTRIN XL) 300 MG 24 hr tablet Take 1 tablet (300 mg total) by mouth daily. 30 tablet 2   hydrOXYzine (ATARAX) 50 MG tablet Take 1 tablet (50 mg total) by mouth 3 (three) times daily as needed for anxiety. (Patient not taking: Reported on 09/20/2021) 30 tablet 1   phentermine (ADIPEX-P) 37.5 MG tablet Take 37.5 mg by mouth every morning.     risperiDONE (RISPERDAL M-TABS) 1 MG disintegrating tablet Take 1 tablet (1 mg total) by mouth 2 (two) times daily. 60 tablet 0   traZODone (DESYREL) 50 MG tablet Take 0.5-1 tablets (25-50 mg total) by mouth at bedtime as needed for sleep. 30 tablet 2   No current facility-administered medications for this visit.      Musculoskeletal: Strength & Muscle Tone:  UTA Gait & Station:  Seated Patient leans: N/A  Psychiatric Specialty Exam: Review of Systems  Psychiatric/Behavioral:  Positive for hallucinations. The patient is nervous/anxious.   All other systems reviewed and are negative.  There were no vitals taken for this visit.There is no height or weight on file to calculate BMI.  General Appearance: Casual  Eye Contact:  Fair  Speech:  Clear and Coherent  Volume:  Normal  Mood:  Anxious  Affect:  Congruent  Thought Process:  Goal Directed and Descriptions of Associations: Intact  Orientation:  Full (Time, Place, and Person)  Thought Content: Delusions and Hallucinations: Auditory reports she has voices which she has been hearing since the past several months since before her inpatient mental health admission, able to cope with it majority of the time however at times  it does make her anxious.  Also reports someone communicates with her subconsciously.  Suicidal Thoughts:  No  Homicidal Thoughts:  No  Memory:  Immediate;   Fair Recent;   Fair Remote;   Fair  Judgement:  Fair  Insight:  Fair  Psychomotor Activity:  Normal  Concentration:  Concentration: Fair and Attention Span: Fair  Recall:  [employer] of Knowledge: Fair  Language: Fair  Akathisia:  No  Handed:  Right  AIMS (if indicated): done  Assets:  Communication Skills Desire for Improvement Housing Social Support Transportation  ADL's:  Intact  Cognition: WNL  Sleep:  Fair   Screenings: AIMS    Flowsheet Row Video Visit from 10/02/2021 in [facility] Video Visit from 09/20/2021 in [facility] Office Visit from 07/20/2021 in [facility]  AIMS Total Score 0 3 0      AUDIT    Flowsheet Row Admission (Discharged) from 06/07/2021 in [facility] Admission (Discharged) from 06/02/2021 in [facility] INPATIENT BEHAVIORAL  MEDICINE  Alcohol Use Disorder Identification Test Final Score (AUDIT) 0 0      GAD-7    Flowsheet Row Video Visit from 09/20/2021 in [facility] Office Visit from 07/20/2021 in [facility] Office Visit from 03/24/2017 in [facility]  Total GAD-7 Score 4 0 2      PHQ2-9    Flowsheet Row Video Visit from 10/02/2021 in [facility] Video Visit from 09/20/2021 in [facility] Office Visit from 07/20/2021 in [facility] Office Visit from 06/20/2021 in [facility] Visit from 12/05/2020 in [facility]  PHQ-2 Total Score 0 0 0 1 0  PHQ-9 Total Score 2 4 3  -- 0      Flowsheet Row Video Visit from 10/02/2021 in [facility] Video Visit from 09/20/2021 in [facility] Office Visit from 07/20/2021 in [facility]  C-SSRS RISK CATEGORY Low Risk Low Risk No Risk        Assessment and Plan: Tonya Watkins is a 51 year old African-American female with history of MDD with psychosis, recent admission to inpatient mental health admission at [facility], presented for medication management.  Patient today reports auditory hallucinations, chronic however does make her anxious at times.  Discussed plan as noted below.  Plan MDD with psychosis-unstable Increase risperidone to 1 mg p.o. daily in the morning and 1.5 mg p.o. nightly Mirtazapine 30 mg p.o. nightly Benztropine 0.5 mg at bedtime as needed Hydroxyzine 50 mg p.o. 3 times daily as needed Trazodone 25-50 mg p.o. nightly as needed  Discussed with patient to stop taking the phentermine due to adverse side effects of possibly triggering psychosis as well as QT prolongation given the fact that she is on medications like risperidone.  Rule out schizophrenia-patient  with lingering voices, delusional thoughts, although reports she is currenly not depressed continues to have psychosis, denies substance abuse.  Will need to explore this in future sessions.  Patient to go to the nearest emergency department if her psychosis worsens.  Follow-up in clinic in 10 days or sooner if needed.  Consent: Patient/Guardian gives verbal consent for treatment and assignment of benefits for services provided during this visit. Patient/Guardian expressed understanding and agreed to proceed.    [facility], MD 10/03/2021, 8:32 AM

## 2021-10-09 ENCOUNTER — Other Ambulatory Visit (HOSPITAL_COMMUNITY): Payer: Self-pay

## 2021-10-22 ENCOUNTER — Telehealth (INDEPENDENT_AMBULATORY_CARE_PROVIDER_SITE_OTHER): Payer: Commercial Managed Care - PPO | Admitting: Psychiatry

## 2021-10-22 ENCOUNTER — Encounter: Payer: Self-pay | Admitting: Psychiatry

## 2021-10-22 DIAGNOSIS — Z9189 Other specified personal risk factors, not elsewhere classified: Secondary | ICD-10-CM

## 2021-10-22 DIAGNOSIS — F333 Major depressive disorder, recurrent, severe with psychotic symptoms: Secondary | ICD-10-CM

## 2021-10-22 MED ORDER — RISPERIDONE 1 MG PO TBDP
1.0000 mg | ORAL_TABLET | ORAL | 0 refills | Status: DC
  Filled 2021-11-02: qty 30, 30d supply, fill #0

## 2021-10-22 NOTE — Progress Notes (Signed)
Virtual Visit via Video Note  I connected with Tonya Watkins on 10/22/21 at  2:30 PM EDT by a video enabled telemedicine application and verified that I am speaking with the correct person using two identifiers.  Location Provider Location : [facility] Patient Location : Work  Participants: Patient , Provider    I discussed the limitations of evaluation and management by telemedicine and the availability of in person appointments. The patient expressed understanding and agreed to proceed.   I discussed the assessment and treatment plan with the patient. The patient was provided an opportunity to ask questions and all were answered. The patient agreed with the plan and demonstrated an understanding of the instructions.   The patient was advised to call back or seek an in-person evaluation if the symptoms worsen or if the condition fails to improve as anticipated.    BH MD OP Progress Note  10/22/2021 2:55 PM Tonya Watkins  MRN:  [id number]  Chief Complaint:  Chief Complaint  Patient presents with   Follow-up: 51 year old African-American female with history of depression, insomnia, presented for medication management.   HPI: Tonya Watkins is a 51 year old African-American female with history of MDD, divorced, employed, lives in [place], presented for medication management.  Patient today reports since being on the higher dosage of risperidone her auditory hallucinations has calmed down although she continues to have it.  She denies side effects to the higher dosage of risperidone.  Patient is interested in dosage increase.  Patient reports appetite as good.  Reports sleep as good.  Reports she is able to function at work.  Currently compliant on the Wellbutrin.  Denies side effects to medications.  Denies any suicidality or homicidality.  Patient reports she has stopped taking the phentermine since discussion at previous visit regarding phentermine could likely induce  psychosis.  Patient denies any other concerns today.  Visit Diagnosis: R/O schizophrenia   ICD-10-CM   1. MDD (major depressive disorder), recurrent, severe, with psychosis (HCC)  F33.3 risperiDONE (RISPERDAL M-TABS) 1 MG disintegrating tablet    2. At risk for prolonged QT interval syndrome  Z91.89 EKG 12-Lead      Past Psychiatric History: Reviewed past psychiatric history from progress note on 07/20/2021.  Past Medical History:  Past Medical History:  Diagnosis Date   Depression    DVT (deep venous thrombosis) (HCC)    left calf   Heart palpitations    Medical history non-contributory    History reviewed. No pertinent surgical history.  Family Psychiatric History: Reviewed family psychiatric history from progress note on 07/20/2021.  Family History:  Family History  Problem Relation Age of Onset   Allergies Mother    Cancer Mother        thyroid   Heart block Maternal Grandmother    Mental illness Neg Hx     Social History: Reviewed social history from progress note on 07/20/2021. Social History   Socioeconomic History   Marital status: Divorced    Spouse name: Not on file   Number of children: 3   Years of education: Not on file   Highest education level: Associate degree: occupational, Scientist, product/process development, or vocational program  Occupational History   Occupation: Charity fundraiser  Tobacco Use   Smoking status: Never   Smokeless tobacco: Never  Vaping Use   Vaping Use: Never used  Substance and Sexual Activity   Alcohol use: Not Currently   Drug use: Not Currently   Sexual activity: Not Currently  Other Topics Concern   Not on  file  Social History Narrative   Not on file   Social Determinants of Health   Financial Resource Strain: Not on file  Food Insecurity: Not on file  Transportation Needs: Not on file  Physical Activity: Not on file  Stress: Not on file  Social Connections: Not on file    Allergies:  Allergies  Allergen Reactions   Mirtazapine     Weight gain     Metabolic Disorder Labs: Lab Results  Component Value Date   HGBA1C 5.5 06/04/2021   MPG 111 06/04/2021   No results found for: "PROLACTIN" Lab Results  Component Value Date   CHOL 147 06/20/2021   TRIG 40.0 06/20/2021   HDL 73.10 06/20/2021   CHOLHDL 2 06/20/2021   VLDL 8.0 06/20/2021   LDLCALC 65 06/20/2021   LDLCALC 71 06/04/2021   Lab Results  Component Value Date   TSH 0.85 06/20/2021   TSH 1.88 05/24/2021    Therapeutic Level Labs: No results found for: "LITHIUM" No results found for: "VALPROATE" No results found for: "CBMZ"  Current Medications: Current Outpatient Medications  Medication Sig Dispense Refill   benztropine (COGENTIN) 0.5 MG tablet Take 1 tablet (0.5 mg total) by mouth at bedtime as needed for tremors. 30 tablet 1   buPROPion (WELLBUTRIN XL) 300 MG 24 hr tablet Take 1 tablet (300 mg total) by mouth daily. 30 tablet 2   traZODone (DESYREL) 50 MG tablet Take 0.5-1 tablets (25-50 mg total) by mouth at bedtime as needed for sleep. 30 tablet 2   hydrOXYzine (ATARAX) 50 MG tablet Take 1 tablet (50 mg total) by mouth 3 (three) times daily as needed for anxiety. (Patient not taking: Reported on 10/22/2021) 30 tablet 1   risperiDONE (RISPERDAL M-TABS) 1 MG disintegrating tablet Take 1-2 tablets (1-2 mg total) by mouth as directed. Take 1 tablet daily AM and 2 tablets daily at bedtime 90 tablet 0   No current facility-administered medications for this visit.     Musculoskeletal: Strength & Muscle Tone:  UTA Gait & Station:  Seated Patient leans: N/A  Psychiatric Specialty Exam: Review of Systems  Psychiatric/Behavioral:  Positive for hallucinations.   All other systems reviewed and are negative.   There were no vitals taken for this visit.There is no height or weight on file to calculate BMI.  General Appearance: Casual  Eye Contact:  Fair  Speech:  Clear and Coherent  Volume:  Normal  Mood:  Euthymic  Affect:  Congruent  Thought Process:   Goal Directed and Descriptions of Associations: Intact  Orientation:  Full (Time, Place, and Person)  Thought Content: Delusions and Hallucinations: Auditory patient with auditory hallucination-1 voice in her head, and also believes voice able to communicate with her subconsciously-improving.  Suicidal Thoughts:  No  Homicidal Thoughts:  No  Memory:  Immediate;   Fair Recent;   Fair Remote;   Fair  Judgement:  Intact  Insight:  Fair  Psychomotor Activity:  Normal  Concentration:  Concentration: Fair and Attention Span: Fair  Recall:  [employer]Fair  Fund of Knowledge: Fair  Language: Fair  Akathisia:  No  Handed:  Right  AIMS (if indicated): done  Assets:  Communication Skills Desire for Improvement Housing Social Support  ADL's:  Intact  Cognition: WNL  Sleep:  Fair   Screenings: AIMS    Flowsheet Row Video [facility] 10[facility]22[facility]2021 [facility] Video Visit from 10/02/2021 in [facility] Video Visit from 09/20/2021 in [facility]  Visit from 07/20/2021 in [facility]  AIMS Total Score 0 0 3 0      AUDIT    Flowsheet Row Admission (Discharged) from 06/07/2021 in [facility]PATIENT BE[facility]AVIORAL MEDICINE Admission (Discharged) from 06/02/2021 in [facility] IN[facility]IENT BE[facility]AL MEDICINE  Alcohol Use Disorder Identification Test Final Score (AUDIT) 0 0      GAD-7    Flowsheet Row Video Visit from 09/20/2021 in [facility] Office Visit from 07/20/2021 in [facility] Office Visit from 03/24/2017 in [facility]  Total GAD-7 Score 4 0 2      PHQ2-9    Flowsheet Row Video Visit from 10/22/2021 in [facility] Video Visit from 10/02/2021 in [facility] Video Visit from 09/20/2021 in [facility] Office Visit from 07/20/2021 in  [facility] Office Visit from 06/20/2021 in [facility]  PHQ-2 Total Score 0 0 0 0 1  PHQ-9 Total Score 2 2 4 3  --      Flowsheet Row Video Visit from 10/22/2021 in [facility] Video Visit from 10/02/2021 in [facility] Video Visit from 09/20/2021 in [facility]  C-SSRS RISK CATEGORY Low Risk Low Risk Low Risk        Assessment and Plan: Tonya Watkins is a 51 year old African-American female with history of MDD with psychosis, recent inpatient mental health admission at [facility], continues to have delusions, auditory hallucinations although improving.  Discussed plan as noted below.  Plan MDD with psychosis-improving Increase risperidone to 1 mg p.o. daily in the morning and 2 mg p.o. nightly. Wellbutrin XL 300 mg p.o. daily. Benztropine 0.5 mg at bedtime as needed Hydroxyzine 50 mg p.o. 3 times daily as needed Trazodone 25-50 mg p.o. nightly as needed  At risk for prolonged QT syndrome-we will order EKG.  Patient provided phone #[phone] to schedule this appointment.  Rule out schizophrenia-patient with lingering voices, delusional thoughts although improving continues to have them in spite of having stable mood.  Follow-up in clinic in 3 to 4 weeks or sooner if needed.   Consent: Patient/Guardian gives verbal consent for treatment and assignment of benefits for services provided during this visit. Patient/Guardian expressed understanding and agreed to proceed.   This note was generated in part or whole with voice recognition software. Voice recognition is usually quite accurate but there are transcription errors that can and very often do occur. I apologize for any typographical errors that were not detected and corrected.      [id number], MD 10/23/2021, 9:02 AM

## 2021-10-30 ENCOUNTER — Other Ambulatory Visit (HOSPITAL_COMMUNITY): Payer: Self-pay

## 2021-10-30 MED ORDER — RISPERIDONE 1 MG PO TBDP
ORAL_TABLET | ORAL | 0 refills | Status: DC
Start: 2021-10-22 — End: 2021-11-21
  Filled 2021-10-30: qty 81, 27d supply, fill #0
  Filled 2021-10-30: qty 9, 3d supply, fill #0

## 2021-10-31 ENCOUNTER — Other Ambulatory Visit (HOSPITAL_COMMUNITY): Payer: Self-pay

## 2021-11-01 ENCOUNTER — Other Ambulatory Visit (HOSPITAL_COMMUNITY): Payer: Self-pay

## 2021-11-02 ENCOUNTER — Other Ambulatory Visit: Payer: Self-pay

## 2021-11-02 ENCOUNTER — Other Ambulatory Visit (HOSPITAL_COMMUNITY): Payer: Self-pay

## 2021-11-02 ENCOUNTER — Encounter: Payer: Self-pay | Admitting: Internal Medicine

## 2021-11-02 MED ORDER — RISPERIDONE 0.5 MG PO TBDP
ORAL_TABLET | ORAL | 0 refills | Status: DC
  Filled 2021-11-02: qty 30, 30d supply, fill #0

## 2021-11-05 ENCOUNTER — Other Ambulatory Visit (HOSPITAL_COMMUNITY): Payer: Self-pay

## 2021-11-05 MED ORDER — BENZTROPINE MESYLATE 0.5 MG PO TABS
0.5000 mg | ORAL_TABLET | Freq: Every evening | ORAL | 1 refills | Status: DC | PRN
  Filled 2021-11-05: qty 30, 30d supply, fill #0

## 2021-11-20 ENCOUNTER — Telehealth: Payer: Self-pay | Admitting: Psychiatry

## 2021-11-20 ENCOUNTER — Telehealth (INDEPENDENT_AMBULATORY_CARE_PROVIDER_SITE_OTHER): Payer: Self-pay | Admitting: Psychiatry

## 2021-11-20 DIAGNOSIS — F333 Major depressive disorder, recurrent, severe with psychotic symptoms: Secondary | ICD-10-CM

## 2021-11-20 NOTE — Telephone Encounter (Signed)
Patient no-showed for scheduled appointment today. Provider requested call to re-schedule. Call went directly to voicemail. Left voicemail requesting call back to re-schedule.

## 2021-11-20 NOTE — Progress Notes (Signed)
No response to call or text or video invite  

## 2021-11-21 ENCOUNTER — Telehealth (INDEPENDENT_AMBULATORY_CARE_PROVIDER_SITE_OTHER): Payer: No Typology Code available for payment source | Admitting: Psychiatry

## 2021-11-21 ENCOUNTER — Encounter: Payer: Self-pay | Admitting: Psychiatry

## 2021-11-21 DIAGNOSIS — F29 Unspecified psychosis not due to a substance or known physiological condition: Secondary | ICD-10-CM

## 2021-11-21 DIAGNOSIS — Z91148 Patient's other noncompliance with medication regimen for other reason: Secondary | ICD-10-CM | POA: Diagnosis not present

## 2021-11-21 DIAGNOSIS — F3341 Major depressive disorder, recurrent, in partial remission: Secondary | ICD-10-CM | POA: Diagnosis not present

## 2021-11-21 DIAGNOSIS — Z9189 Other specified personal risk factors, not elsewhere classified: Secondary | ICD-10-CM

## 2021-11-21 MED ORDER — BUPROPION HCL ER (XL) 300 MG PO TB24: 300.0000 mg | ORAL_TABLET | Freq: Every day | ORAL | 1 refills | Status: DC

## 2021-11-21 MED ORDER — RISPERIDONE 1 MG PO TBDP: ORAL_TABLET | ORAL | 1 refills | Status: DC

## 2021-11-21 MED ORDER — BENZTROPINE MESYLATE 0.5 MG PO TABS: 0.5000 mg | ORAL_TABLET | Freq: Every evening | ORAL | 1 refills | Status: DC | PRN

## 2021-11-21 MED ORDER — RISPERIDONE 0.5 MG PO TBDP: ORAL_TABLET | ORAL | 1 refills | Status: DC

## 2021-11-21 MED ORDER — HYDROXYZINE HCL 50 MG PO TABS: 50.0000 mg | ORAL_TABLET | Freq: Two times a day (BID) | ORAL | 1 refills | Status: DC | PRN

## 2021-11-21 NOTE — Progress Notes (Unsigned)
Virtual Visit via Video Note  I connected with Tonya Watkins on 11/21/21 at 10:00 AM EDT by a video enabled telemedicine application and verified that I am speaking with the correct person using two identifiers.  Location Provider Location : [facility] Patient Location : Car  Participants: Patient , Provider   I discussed the limitations of evaluation and management by telemedicine and the availability of in person appointments. The patient expressed understanding and agreed to proceed.    I discussed the assessment and treatment plan with the patient. The patient was provided an opportunity to ask questions and all were answered. The patient agreed with the plan and demonstrated an understanding of the instructions.   The patient was advised to call back or seek an in-person evaluation if the symptoms worsen or if the condition fails to improve as anticipated.   BH MD OP Progress Note  11/21/2021 11:10 AM Tonya Watkins  MRN:  [id number]  Chief Complaint:  Chief Complaint  Patient presents with   Follow-up: 51 year old African-American female with history of depression, psychosis presented for medication management.   HPI: Tonya Watkins is a 51 year old African-American female with history of MDD, divorced, employed, lives in [place], presented for medication management.  Patient had an appointment with writer yesterday however was a no-show.  Patient was scheduled to be seen today after contacting her.  Patient today reports she was busy in training at a new job and hence could not keep her appointment yesterday.  Patient reports she could not keep up with the pace of her previous job and hence decided to change it.  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  She reports so far she has been able to cope with the new job needs.  Patient reports overall depressive symptoms may have improved.  She does  not feel depressed or hopeless.  She does not have any anhedonia.  Does feel tired a lot unknown if this has anything to do with her medications or due to her job.  Will need to explore this in future sessions.  Patient however reports in spite of not being depressed she continues to have psychosis, reports hearing voices in her head that tells her negative things like she is not a good person, she is not a good mom and so on.  That has not changed much.  Some days it bothers her.  She reports she however has not been able to increase the risperidone as discussed last visit-10/22/2021.  Risperidone 0.5 mg extra dosage was sent to her pharmacy.  Patient reports she is agreeable to pick it up from pharmacy today and start taking a higher dosage.  Patient reports sleep is overall good on the melatonin which she has added to her regimen.  She also uses trazodone as needed.  Patient denies any suicidality or homicidality.  Currently denies any other concerns.  Patient has not been able to get her EKG completed as requested last visit, agrees to get it done.  Order is in the system.  Visit Diagnosis: R/O Schizophrenia   ICD-10-CM   1. MDD (major depressive disorder), recurrent, in partial remission (HCC)  F33.41 hydrOXYzine (ATARAX) 50 MG tablet    2. Psychosis, unspecified psychosis type (HCC)  F29 buPROPion (WELLBUTRIN XL) 300 MG 24 hr tablet    benztropine (COGENTIN) 0.5 MG tablet    3. Noncompliance with medication regimen  Z91.148 risperiDONE (RISPERDAL  M-TABS) 1 MG disintegrating tablet    risperiDONE (RISPERDAL M-TABS) 0.5 MG disintegrating tablet    4. At risk for prolonged QT interval syndrome  Z91.89       Past Psychiatric History: Reviewed past psychiatric history from progress note on 07/20/2021.  Past Medical History:  Past Medical History:  Diagnosis Date   Depression    DVT (deep venous thrombosis) (HCC)    left calf   Heart palpitations    Medical history non-contributory     History reviewed. No pertinent surgical history.  Family Psychiatric History: Reviewed family psychiatric history from progress note on 07/20/2021.  Family History:  Family History  Problem Relation Age of Onset   Allergies Mother    Cancer Mother        thyroid   Heart block Maternal Grandmother    Mental illness Neg Hx     Social History: Reviewed social history from progress note on 07/20/2021. Social History   Socioeconomic History   Marital status: Divorced    Spouse name: Not on file   Number of children: 3   Years of education: Not on file   Highest education level: Associate degree: occupational, Scientist, product/process development, or vocational program  Occupational History   Occupation: Charity fundraiser  Tobacco Use   Smoking status: Never   Smokeless tobacco: Never  Vaping Use   Vaping Use: Never used  Substance and Sexual Activity   Alcohol use: Not Currently   Drug use: Not Currently   Sexual activity: Not Currently  Other Topics Concern   Not on file  Social History Narrative   Not on file   Social Determinants of Health   Financial Resource Strain: Not on file  Food Insecurity: Not on file  Transportation Needs: Not on file  Physical Activity: Not on file  Stress: Not on file  Social Connections: Not on file    Allergies:  Allergies  Allergen Reactions   Mirtazapine     Weight gain    Metabolic Disorder Labs: Lab Results  Component Value Date   HGBA1C 5.5 06/04/2021   MPG 111 06/04/2021   No results found for: "PROLACTIN" Lab Results  Component Value Date   CHOL 147 06/20/2021   TRIG 40.0 06/20/2021   HDL 73.10 06/20/2021   CHOLHDL 2 06/20/2021   VLDL 8.0 06/20/2021   LDLCALC 65 06/20/2021   LDLCALC 71 06/04/2021   Lab Results  Component Value Date   TSH 0.85 06/20/2021   TSH 1.88 05/24/2021    Therapeutic Level Labs: No results found for: "LITHIUM" No results found for: "VALPROATE" No results found for: "CBMZ"  Current Medications: Current Outpatient  Medications  Medication Sig Dispense Refill   Melatonin 10 MG TABS Take by mouth.     traZODone (DESYREL) 50 MG tablet Take 0.5-1 tablets (25-50 mg total) by mouth at bedtime as needed for sleep. 30 tablet 2   benztropine (COGENTIN) 0.5 MG tablet Take 1 tablet (0.5 mg total) by mouth at bedtime as needed for tremors. 30 tablet 1   buPROPion (WELLBUTRIN XL) 300 MG 24 hr tablet Take 1 tablet (300 mg total) by mouth daily. 30 tablet 1   hydrOXYzine (ATARAX) 50 MG tablet Take 1 tablet (50 mg total) by mouth 2 (two) times daily as needed for anxiety. 60 tablet 1   risperiDONE (RISPERDAL M-TABS) 0.5 MG disintegrating tablet Take one tablet by mouth at bedtime (Take along with 1mg  at bedtime) 30 tablet 1   risperiDONE (RISPERDAL M-TABS) 1 MG disintegrating tablet  Dissolve 1 tablet (1 mg total) by mouth in the morning AND 2 tablets (2 mg total) at bedtime. 90 tablet 1   No current facility-administered medications for this visit.     Musculoskeletal: Strength & Muscle Tone:  UTA Gait & Station:  Seated Patient leans: N/A  Psychiatric Specialty Exam: Review of Systems  Constitutional:  Positive for fatigue.  Psychiatric/Behavioral:  Positive for hallucinations.   All other systems reviewed and are negative.   There were no vitals taken for this visit.There is no height or weight on file to calculate BMI.  General Appearance: Casual  Eye Contact:  Fair  Speech:  Clear and Coherent  Volume:  Normal  Mood:  Euthymic  Affect:  Restricted  Thought Process:  Goal Directed and Descriptions of Associations: Intact  Orientation:  Full (Time, Place, and Person)  Thought Content: Hallucinations: Auditory as noted above  Suicidal Thoughts:  No  Homicidal Thoughts:  No  Memory:  Immediate;   Fair Recent;   Fair Remote;   Fair  Judgement:  Fair  Insight:  Fair  Psychomotor Activity:  Normal  Concentration:  Concentration: Fair and Attention Span: Fair  Recall:  [employer] of Knowledge: Fair   Language: Fair  Akathisia:  No  Handed:  Right  AIMS (if indicated): not done  Assets:  Communication Skills Desire for Improvement Housing Social Support  ADL's:  Intact  Cognition: WNL  Sleep:  Fair   Screenings: AIMS    Flowsheet Row Video Visit from 10/22/2021 in [facility] Video Visit from 10/02/2021 in [facility] Video Visit from 09/20/2021 in [facility] Office Visit from 07/20/2021 in [facility]  AIMS Total Score 0 0 3 0      AUDIT    Flowsheet Row Admission (Discharged) from 06/07/2021 in [facility] BEHAV[facility]ICINE Admission (Discharged) from 06/02/2021 in [facility] INPATIENT BEH[facility]AL MEDICINE  Alcohol Use Disorder Identification Test Final Score (AUDIT) 0 0      GAD-7    Flowsheet Row Video Visit from 09/20/2021 in [facility] Office Visit from 07/20/2021 in [facility] Office Visit from 03/24/2017 in [facility]  Total GAD-7 Score 4 0 2      PHQ2-9    Flowsheet Row Video Visit from 11/21/2021 in [facility] Video Visit from 10/22/2021 in [facility] Video Visit from 10/02/2021 in [facility] Video Visit from 09/20/2021 in [facility] Office Visit from 07/20/2021 in [facility]  PHQ-2 Total Score 0 0 0 0 0  PHQ-9 Total Score 8 2 2 4 3       Flowsheet Row Video Visit from 11/21/2021 in [facility] Video Visit from 10/22/2021 in [facility] Video Visit from 10/02/2021 in [facility]  C-SSRS RISK CATEGORY Low Risk Low Risk Low Risk        Assessment and Plan: Charnae Vollrath is a 51 year old African-American female with history of MDD , psychosis  unspecified, continues to have auditory hallucination although she reports her depression has improved, will benefit from the following plan.  Plan MDD in partial remission Risperidone 2.5 mg at bedtime and 1 mg p.o. daily during the day.  Patient however has been noncompliant with the 0.5 mg dosage increase at bedtime.   Encouraged compliance. Wellbutrin XL 300 mg p.o. daily Benztropine 0.5 mg at bedtime as needed for side  effects of risperidone Change hydroxyzine to 50 mg p.o. twice daily as needed.  She has been limiting use. Trazodone 25-50 mg p.o. nightly as needed for sleep Start melatonin 10 mg p.o. nightly as needed-patient has been using it.  Psychosis unspecified-rule out schizophrenia versus schizoaffective disorder-unstable Although her depression has improved patient continues to have auditory hallucinations.  Will need to explore this in future sessions. Dosage of risperidone has been increased however she has been noncompliant.  Encouraged compliance.  At risk for prolonged QT syndrome-order EKG-patient encouraged to get it done by calling [phone].  Noncompliance with medication regimen-unstable Encourage compliance.  Follow-up in clinic in 4 weeks or sooner if needed.    Collaboration of Care: Collaboration of Care: Other encouraged to get EKG completed.  Patient/Guardian was advised Release of Information must be obtained prior to any record release in order to collaborate their care with an outside provider. Patient/Guardian was advised if they have not already done so to contact the registration department to sign all necessary forms in order for [place] to release information regarding their care.   Consent: Patient/Guardian gives verbal consent for treatment and assignment of benefits for services provided during this visit. Patient/Guardian expressed understanding and agreed to proceed.   This note was generated in part or whole with voice recognition software.  Voice recognition is usually quite accurate but there are transcription errors that can and very often do occur. I apologize for any typographical errors that were not detected and corrected.      Tonya Longs, MD 11/22/2021, 8:25 AM

## 2021-12-26 ENCOUNTER — Telehealth (INDEPENDENT_AMBULATORY_CARE_PROVIDER_SITE_OTHER): Payer: Self-pay | Admitting: Psychiatry

## 2021-12-26 ENCOUNTER — Telehealth: Payer: Self-pay | Admitting: Psychiatry

## 2021-12-26 DIAGNOSIS — F3341 Major depressive disorder, recurrent, in partial remission: Secondary | ICD-10-CM

## 2021-12-26 DIAGNOSIS — Z91148 Patient's other noncompliance with medication regimen for other reason: Secondary | ICD-10-CM

## 2021-12-26 DIAGNOSIS — F29 Unspecified psychosis not due to a substance or known physiological condition: Secondary | ICD-10-CM

## 2021-12-26 DIAGNOSIS — F3342 Major depressive disorder, recurrent, in full remission: Secondary | ICD-10-CM

## 2021-12-26 MED ORDER — BUPROPION HCL ER (XL) 300 MG PO TB24: 300.0000 mg | ORAL_TABLET | Freq: Every day | ORAL | 0 refills | Status: DC

## 2021-12-26 MED ORDER — RISPERIDONE 1 MG PO TBDP: ORAL_TABLET | ORAL | 0 refills | Status: DC

## 2021-12-26 MED ORDER — TRAZODONE HCL 50 MG PO TABS: 25.0000 mg | ORAL_TABLET | Freq: Every evening | ORAL | 0 refills | Status: DC | PRN

## 2021-12-26 MED ORDER — HYDROXYZINE HCL 50 MG PO TABS: 50.0000 mg | ORAL_TABLET | Freq: Two times a day (BID) | ORAL | 0 refills | Status: DC | PRN

## 2021-12-26 MED ORDER — RISPERIDONE 0.5 MG PO TBDP: ORAL_TABLET | ORAL | 0 refills | Status: DC

## 2021-12-26 MED ORDER — BENZTROPINE MESYLATE 0.5 MG PO TABS: 0.5000 mg | ORAL_TABLET | Freq: Every evening | ORAL | 0 refills | Status: DC | PRN

## 2021-12-26 NOTE — Telephone Encounter (Signed)
Patient with 3 no-shows including today.  Will have staff Shanda Bumps CMA ) sent a letter to patient, including community resources.  Patient being terminated from this practice.  Will provide 30 days refills.

## 2021-12-26 NOTE — Progress Notes (Signed)
No response to call or text or video invite  

## 2021-12-26 NOTE — Telephone Encounter (Signed)
Letter of termination was sent out.

## 2021-12-29 ENCOUNTER — Other Ambulatory Visit: Payer: Self-pay | Admitting: Internal Medicine

## 2022-01-10 ENCOUNTER — Encounter: Payer: Self-pay | Admitting: Internal Medicine

## 2022-01-16 ENCOUNTER — Telehealth: Payer: Self-pay

## 2022-01-16 DIAGNOSIS — Z91148 Patient's other noncompliance with medication regimen for other reason: Secondary | ICD-10-CM

## 2022-01-16 DIAGNOSIS — F3342 Major depressive disorder, recurrent, in full remission: Secondary | ICD-10-CM

## 2022-01-16 DIAGNOSIS — F29 Unspecified psychosis not due to a substance or known physiological condition: Secondary | ICD-10-CM

## 2022-01-16 DIAGNOSIS — F3341 Major depressive disorder, recurrent, in partial remission: Secondary | ICD-10-CM

## 2022-01-16 MED ORDER — BENZTROPINE MESYLATE 0.5 MG PO TABS: 0.5000 mg | ORAL_TABLET | Freq: Every evening | ORAL | 0 refills | Status: DC | PRN

## 2022-01-16 MED ORDER — TRAZODONE HCL 50 MG PO TABS: 25.0000 mg | ORAL_TABLET | Freq: Every evening | ORAL | 0 refills | Status: DC | PRN

## 2022-01-16 MED ORDER — BUPROPION HCL ER (XL) 300 MG PO TB24: 300.0000 mg | ORAL_TABLET | Freq: Every day | ORAL | 0 refills | Status: DC

## 2022-01-16 MED ORDER — RISPERIDONE 1 MG PO TBDP: ORAL_TABLET | ORAL | 0 refills | Status: DC

## 2022-01-16 MED ORDER — RISPERIDONE 0.5 MG PO TBDP: ORAL_TABLET | ORAL | 0 refills | Status: DC

## 2022-01-16 MED ORDER — HYDROXYZINE HCL 50 MG PO TABS: 50.0000 mg | ORAL_TABLET | Freq: Two times a day (BID) | ORAL | 0 refills | Status: DC | PRN

## 2022-01-16 NOTE — Telephone Encounter (Signed)
I will send her 30 more days of refills on her medications to [facility] pharmacy.  Since her condition is not stable I do not want to give out 90 days supply.  Please advise patient to go to [facility], walk in or  to follow up with primary care provider.

## 2022-01-16 NOTE — Telephone Encounter (Signed)
pt has received the dismissal letter but she having a hard time getting in with anyone else she wanted to know if you would send in a 90 day supply of medication until she can get in with someone.

## 2022-01-17 NOTE — Telephone Encounter (Signed)
Tried to call patient back again, no answer and no message could be left. Mail box is full

## 2022-02-05 ENCOUNTER — Ambulatory Visit (INDEPENDENT_AMBULATORY_CARE_PROVIDER_SITE_OTHER): Payer: BC Managed Care – PPO | Admitting: Adult Health

## 2022-02-05 ENCOUNTER — Encounter: Payer: Self-pay | Admitting: Adult Health

## 2022-02-05 VITALS — BP 135/81 | HR 75 | Ht 63.0 in | Wt 154.0 lb

## 2022-02-05 DIAGNOSIS — Z91148 Patient's other noncompliance with medication regimen for other reason: Secondary | ICD-10-CM | POA: Diagnosis not present

## 2022-02-05 DIAGNOSIS — F29 Unspecified psychosis not due to a substance or known physiological condition: Secondary | ICD-10-CM

## 2022-02-05 DIAGNOSIS — F3341 Major depressive disorder, recurrent, in partial remission: Secondary | ICD-10-CM | POA: Diagnosis not present

## 2022-02-05 DIAGNOSIS — F3342 Major depressive disorder, recurrent, in full remission: Secondary | ICD-10-CM

## 2022-02-05 DIAGNOSIS — Z9189 Other specified personal risk factors, not elsewhere classified: Secondary | ICD-10-CM | POA: Diagnosis not present

## 2022-02-05 MED ORDER — RISPERIDONE 1 MG PO TBDP: ORAL_TABLET | ORAL | 0 refills | Status: DC

## 2022-02-05 MED ORDER — BENZTROPINE MESYLATE 0.5 MG PO TABS: 0.5000 mg | ORAL_TABLET | Freq: Every evening | ORAL | 0 refills | Status: DC | PRN

## 2022-02-05 MED ORDER — BUPROPION HCL ER (XL) 300 MG PO TB24: 300.0000 mg | ORAL_TABLET | Freq: Every day | ORAL | 0 refills | Status: DC

## 2022-02-05 NOTE — Progress Notes (Signed)
[facility] MD/PA/NP Initial Note  02/05/2022 9:48 AM Tonya Watkins  MRN:  [id number]  Chief Complaint:   HPI:   Patient seen today for initial psychiatric evaluation.   Previous hospital admissions reviewed.  Previous provider collateral reviewed.   Describes mood today as "not the best". Pleasant. Tearful at times. Mood symptoms - reports some depression - "not all the time and not constant". Feels anxious sometimes. Denies irritability - occasionally. Mood is consistent - denies highs and lows. Stating "I just try and press through things every day". Reports a hospitalization in January after mood decompensation - diagnosed with MDD with psychosis. Reporting situational stressors led to a loss of sleep and eventually psychosis. She was seen in the ED initially and discharged - then returned a few days later with psychotic symptoms. She was treated and stabilized to follow up with outpatient care. The outpatient provider recently dissolved the relationship after patient missing three appointments. Would like to establish care today and feels like current medications are helpful. Continues to present with auditory hallucinations to include only one voice. Willing to consider further options for treatment and possible resolution of symptoms. She does not feel like she is able to get adequate sleep with current job demands and commitments. She is willing to consider a change in schedule while she is recovering. Denies any prior history of mental illness until January of this year. Improved interest and motivation. Taking medications as prescribed.  Energy levels lower. Active, does not have a regular exercise routine.  Enjoys some usual interests and activities. Divorced. Was in relationship for 8 years that ended in December of 2022. Has 3 children 26, 22 and 16 at home. Spending time with family. Appetite adequate. Weight stable - 154 pounds. Sleeping difficulties. Averages 6 to 7 hours of  broken. Focus and concentration difficulties. Completing tasks. Managing aspects of household. Working full time - home health.  Denies SI or HI.  Denies AH. Positive for VH - one voice - female saying negative things.  Denies self harm. Denies substance use.  Previous medication trials:  Wellbutrin XL 300mg  for years up until hospitalization.  Visit Diagnosis: No diagnosis found.  Past Psychiatric History: Admitted in January 2023 to [facility] for psychosis.  Past Medical History:  Past Medical History:  Diagnosis Date   Depression    DVT (deep venous thrombosis) (HCC)    left calf   Heart palpitations    Medical history non-contributory    No past surgical history on file.  Family Psychiatric History: Denies any family history of mental illness.   Family History:  Family History  Problem Relation Age of Onset   Allergies Mother    Cancer Mother        thyroid   Heart block Maternal Grandmother    Mental illness Neg Hx     Social History:  Social History   Socioeconomic History   Marital status: Divorced    Spouse name: Not on file   Number of children: 3   Years of education: Not on file   Highest education level: Associate degree: occupational, [employer], or vocational program  Occupational History   Occupation: Scientist, product/process development  Tobacco Use   Smoking status: Never   Smokeless tobacco: Never  Vaping Use   Vaping Use: Never used  Substance and Sexual Activity   Alcohol use: Not Currently   Drug use: Not Currently   Sexual activity: Not Currently  Other Topics Concern   Not on file  Social History  Narrative   Not on file   Social Determinants of Health   Financial Resource Strain: Not on file  Food Insecurity: Not on file  Transportation Needs: Not on file  Physical Activity: Not on file  Stress: Not on file  Social Connections: Not on file    Allergies:  Allergies  Allergen Reactions   Mirtazapine     Weight gain    Metabolic Disorder  Labs: Lab Results  Component Value Date   HGBA1C 5.5 06/04/2021   MPG 111 06/04/2021   No results found for: "PROLACTIN" Lab Results  Component Value Date   CHOL 147 06/20/2021   TRIG 40.0 06/20/2021   HDL 73.10 06/20/2021   CHOLHDL 2 06/20/2021   VLDL 8.0 06/20/2021   LDLCALC 65 06/20/2021   LDLCALC 71 06/04/2021   Lab Results  Component Value Date   TSH 0.85 06/20/2021   TSH 1.88 05/24/2021    Therapeutic Level Labs: No results found for: "LITHIUM" No results found for: "VALPROATE" No results found for: "CBMZ"  Current Medications: Current Outpatient Medications  Medication Sig Dispense Refill   benztropine (COGENTIN) 0.5 MG tablet Take 1 tablet (0.5 mg total) by mouth at bedtime as needed for tremors. 30 tablet 0   buPROPion (WELLBUTRIN XL) 300 MG 24 hr tablet Take 1 tablet (300 mg total) by mouth daily. 30 tablet 0   hydrOXYzine (ATARAX) 50 MG tablet Take 1 tablet (50 mg total) by mouth 2 (two) times daily as needed for anxiety. 60 tablet 0   Melatonin 10 MG TABS Take by mouth.     risperiDONE (RISPERDAL M-TABS) 0.5 MG disintegrating tablet Take one tablet by mouth at bedtime (Take along with 1mg  at bedtime) 30 tablet 0   risperiDONE (RISPERDAL M-TABS) 1 MG disintegrating tablet Dissolve 1 tablet (1 mg total) by mouth in the morning AND 2 tablets (2 mg total) at bedtime. 90 tablet 0   traZODone (DESYREL) 50 MG tablet Take 0.5-1 tablets (25-50 mg total) by mouth at bedtime as needed for sleep. 30 tablet 0   No current facility-administered medications for this visit.    Medication Side Effects: none  Orders placed this visit:  No orders of the defined types were placed in this encounter.   Psychiatric Specialty Exam:  Review of Systems  Musculoskeletal:  Negative for gait problem.  Neurological:  Negative for tremors.  Psychiatric/Behavioral:         Please refer to HPI    There were no vitals taken for this visit.There is no height or weight on file to  calculate BMI.  General Appearance: Casual and Well Groomed  Eye Contact:  Good  Speech:  Clear and Coherent and Normal Rate  Volume:  Normal  Mood:  Euthymic  Affect:  Appropriate and Congruent  Thought Process:  Coherent and Descriptions of Associations: Intact  Orientation:  Full (Time, Place, and Person)  Thought Content: Logical   Suicidal Thoughts:  No  Homicidal Thoughts:  No  Memory:  WNL  Judgement:  Good  Insight:  Good  Psychomotor Activity:  Normal  Concentration:  Concentration: Poor and Attention Span: Poor  Recall:  Fair  Fund of Knowledge: Good  Language: Good  Assets:  Communication Skills Desire for Improvement Financial Resources/Insurance Housing Intimacy Leisure Time Physical Health Resilience Social Support Talents/Skills Transportation Vocational/Educational  ADL's:  Intact  Cognition: WNL  Prognosis:  Good   Screenings:  AIMS    Flowsheet Row Video Visit from 10/22/2021 in [facility]  Video Visit from 10/02/2021 in [place] Video Visit from 09/20/2021 in [place] Office Visit from 07/20/2021 in [place] Total Score 0 0 3 0      AUDIT    Flowsheet Row Admission (Discharged) from 06/07/2021 in [place] Admission (Discharged) from 06/02/2021 in [place]  Alcohol Use Disorder Identification Test Final Score (AUDIT) 0 0      GAD-7    Flowsheet Row Video Visit from 09/20/2021 in [place] Office Visit from 07/20/2021 in [place] Office Visit from 03/24/2017 in [facility]  Total GAD-7 Score 4 0 2      PHQ2-9    Flowsheet Row Video Visit from 11/21/2021 in [place] Video Visit from 10/22/2021 in [place] Video Visit from  10/02/2021 in [place] Video Visit from 09/20/2021 in [place] Office Visit from 07/20/2021 in [place]  PHQ-2 Total Score 0 0 0 0 0  PHQ-9 Total Score 8 2 2 4 3       Flowsheet Row Video Visit from 11/21/2021 in [place] Video Visit from 10/22/2021 in [place] Video Visit from 10/02/2021 in [place] Low Risk Low Risk Low Risk       Receiving Psychotherapy: No   Treatment Plan/Recommendations:   Plan:  PDMP reviewed  Increase Risperidone 2.5 mg to 3mg  at bedtime and 1 mg in the morning.  Wellbutrin XL 300 mg daily Benztropine 0.5 mg at bedtime  Melatonin 10 mg p.o. nightly as needed - uses every night  PRN: Hydroxyzine 50mg  daily as needed Trazodone 50 mg nightly as needed for sleep  Will plan to get an updated EKG - documentation given - plans to call Dr Alain Marion office for appt.  ED ECG REPORT  Date: 06/01/2021 EKG Time: 1945 Rate: 83 Rhythm: normal sinus rhythm QRS Axis: normal Intervals: normal ST/T Wave abnormalities: normal Narrative Interpretation: no evidence of acute ischemia; no significant change when compared to EKG of 12/28/2011  EKG-dated 06/04/2021-normal sinus rhythm.  QTc-444.  Time spent with patient was 60 minutes. Greater than 50% of face to face time with patient was spent on counseling and coordination of care.    RTC 4 weeks  Patient advised to contact office with any questions, adverse effects, or acute worsening in signs and symptoms.   Discussed potential metabolic side effects associated with atypical antipsychotics, as well as potential risk for movement side effects. Advised pt to contact office if movement side effects occur.    Aloha Gell, NP

## 2022-02-09 ENCOUNTER — Other Ambulatory Visit: Payer: Self-pay | Admitting: Internal Medicine

## 2022-02-26 ENCOUNTER — Telehealth (INDEPENDENT_AMBULATORY_CARE_PROVIDER_SITE_OTHER): Payer: BC Managed Care – PPO | Admitting: Adult Health

## 2022-02-26 ENCOUNTER — Encounter: Payer: Self-pay | Admitting: Adult Health

## 2022-02-26 DIAGNOSIS — G4700 Insomnia, unspecified: Secondary | ICD-10-CM

## 2022-02-26 DIAGNOSIS — F3341 Major depressive disorder, recurrent, in partial remission: Secondary | ICD-10-CM

## 2022-02-26 DIAGNOSIS — Z91148 Patient's other noncompliance with medication regimen for other reason: Secondary | ICD-10-CM

## 2022-02-26 DIAGNOSIS — F29 Unspecified psychosis not due to a substance or known physiological condition: Secondary | ICD-10-CM

## 2022-02-26 MED ORDER — BENZTROPINE MESYLATE 0.5 MG PO TABS: 0.5000 mg | ORAL_TABLET | Freq: Every evening | ORAL | 2 refills | Status: DC | PRN

## 2022-02-26 MED ORDER — BUPROPION HCL ER (XL) 300 MG PO TB24: 300.0000 mg | ORAL_TABLET | Freq: Every day | ORAL | 2 refills | Status: DC

## 2022-02-26 MED ORDER — RISPERIDONE 1 MG PO TBDP: ORAL_TABLET | ORAL | 2 refills | Status: DC

## 2022-02-26 NOTE — Progress Notes (Signed)
Tonya Watkins [id number] 02-14-1971 51 y.o.  Virtual Visit via Video Note  I connected with pt @ on 02/26/22 at  5:20 PM EDT by a video enabled telemedicine application and verified that I am speaking with the correct person using two identifiers.   I discussed the limitations of evaluation and management by telemedicine and the availability of in person appointments. The patient expressed understanding and agreed to proceed.  I discussed the assessment and treatment plan with the patient. The patient was provided an opportunity to ask questions and all were answered. The patient agreed with the plan and demonstrated an understanding of the instructions.   The patient was advised to call back or seek an in-person evaluation if the symptoms worsen or if the condition fails to improve as anticipated.  I provided 25 minutes of non-face-to-face time during this encounter.  The patient was located at home.  The provider was located at [place].   Tonya Gell, NP   Subjective:   Patient ID:  Tonya Watkins is a 51 y.o. (DOB 09-07-1970) female.  Chief Complaint: No chief complaint on file.   HPI Tonya Watkins presents for follow-up of MDD, psychosis and insomnia.  Describes mood today as "about the same". Pleasant. Tearful at times. Mood symptoms - denies depression. Reports anxiety - "a little bit". Irritable at times - "occasionally". Reports worry - "wanting the voice to go away".  Mood is consistent - denies highs and lows. Stating "things are about the same as last time". Still hearing the voice she was hearing previously - man's voice - saying negative things. Feels like she is better overall. Has still been unable to get adequate sleep due to demands of current job. Improved interest and motivation. Taking medications as prescribed. Energy levels lower. Active, does not have a regular exercise routine.  Enjoys some usual interests and activities. Divorced. Was in  relationship for 8 years that ended in December of 2022. Has 3 children 26, 22 and 16 at home. Spending time with family. Appetite adequate. Weight stable - 154 pounds. Sleeping difficulties. Averages 6 hours of broken. Focus and concentration difficulties. Completing tasks. Managing aspects of household. Working full time - home health.  Denies SI or HI.  Denies AH. Positive for VH - one voice - female saying negative things - not as "loud".  Denies self harm. Denies substance use.  Previous medication trials:  Wellbutrin XL 300mg  for years up until hospitalization.  Visit Diagnosis: No diagnosis found.  Past Psychiatric History: Admitted in January 2023 to [facility] for psychosis. Review of Systems:  Review of Systems  Musculoskeletal:  Negative for gait problem.  Neurological:  Negative for tremors.  Psychiatric/Behavioral:         Please refer to HPI    Medications: I have reviewed the patient's current medications.  Current Outpatient Medications  Medication Sig Dispense Refill   benztropine (COGENTIN) 0.5 MG tablet Take 1 tablet (0.5 mg total) by mouth at bedtime as needed for tremors. 30 tablet 0   buPROPion (WELLBUTRIN XL) 300 MG 24 hr tablet Take 1 tablet (300 mg total) by mouth daily. 30 tablet 0   hydrOXYzine (ATARAX) 50 MG tablet Take 1 tablet (50 mg total) by mouth 2 (two) times daily as needed for anxiety. 60 tablet 0   Melatonin 10 MG TABS Take by mouth.     risperiDONE (RISPERDAL M-TABS) 1 MG disintegrating tablet Take 1 tablet (1 mg total) by mouth in the morning AND 3  tablets (3 mg total) at bedtime. 120 tablet 0   traZODone (DESYREL) 50 MG tablet Take 0.5-1 tablets (25-50 mg total) by mouth at bedtime as needed for sleep. 30 tablet 0   valACYclovir (VALTREX) 500 MG tablet Take 1 tablet by mouth once daily 30 tablet 2   No current facility-administered medications for this visit.    Medication Side Effects: None  Allergies:  Allergies   Allergen Reactions   Mirtazapine     Weight gain    Past Medical History:  Diagnosis Date   Depression    DVT (deep venous thrombosis) (HCC)    left calf   Heart palpitations    Medical history non-contributory     Family History  Problem Relation Age of Onset   Allergies Mother    Cancer Mother        thyroid   Heart block Maternal Grandmother    Mental illness Neg Hx     Social History   Socioeconomic History   Marital status: Divorced    Spouse name: Not on file   Number of children: 3   Years of education: Not on file   Highest education level: Associate degree: occupational, Hotel manager, or vocational program  Occupational History   Occupation: Therapist, sports  Tobacco Use   Smoking status: Never   Smokeless tobacco: Never  Vaping Use   Vaping Use: Never used  Substance and Sexual Activity   Alcohol use: Not Currently   Drug use: Not Currently   Sexual activity: Not Currently  Other Topics Concern   Not on file  Social History Narrative   Not on file   Social Determinants of Health   Financial Resource Strain: Not on file  Food Insecurity: Not on file  Transportation Needs: Not on file  Physical Activity: Not on file  Stress: Not on file  Social Connections: Not on file  Intimate Partner Violence: Not on file    Past Medical History, Surgical history, Social history, and Family history were reviewed and updated as appropriate.   Please see review of systems for further details on the patient's review from today.   Objective:   Physical Exam:  There were no vitals taken for this visit.  Physical Exam Constitutional:      General: She is not in acute distress. Musculoskeletal:        General: No deformity.  Neurological:     Mental Status: She is alert and oriented to person, place, and time.     Coordination: Coordination normal.  Psychiatric:        Attention and Perception: Attention and perception normal. She does not perceive auditory or visual  hallucinations.        Mood and Affect: Mood normal. Mood is not anxious or depressed. Affect is not labile, blunt, angry or inappropriate.        Speech: Speech normal.        Behavior: Behavior normal.        Thought Content: Thought content normal. Thought content is not paranoid or delusional. Thought content does not include homicidal or suicidal ideation. Thought content does not include homicidal or suicidal plan.        Cognition and Memory: Cognition and memory normal.        Judgment: Judgment normal.     Comments: Insight intact     Lab Review:     Component Value Date/Time   NA 141 06/20/2021 1129   K 3.9 06/20/2021 1129   CL 104  06/20/2021 1129   CO2 29 06/20/2021 1129   GLUCOSE 81 06/20/2021 1129   BUN 9 06/20/2021 1129   CREATININE 0.79 06/20/2021 1129   CREATININE 0.82 12/16/2019 1614   CALCIUM 9.6 06/20/2021 1129   PROT 7.5 06/20/2021 1129   ALBUMIN 4.2 06/20/2021 1129   AST 19 06/20/2021 1129   ALT 18 06/20/2021 1129   ALKPHOS 43 06/20/2021 1129   BILITOT 0.6 06/20/2021 1129   GFRNONAA >60 06/06/2021 1150   GFRNONAA 85 12/16/2019 1614   GFRAA 98 12/16/2019 1614       Component Value Date/Time   WBC 4.7 06/20/2021 1129   RBC 3.77 (L) 06/20/2021 1129   HGB 11.6 (L) 06/20/2021 1129   HCT 35.1 (L) 06/20/2021 1129   PLT 277.0 06/20/2021 1129   MCV 93.0 06/20/2021 1129   MCH 31.3 06/06/2021 1150   MCHC 33.0 06/20/2021 1129   RDW 12.7 06/20/2021 1129   LYMPHSABS 1.1 06/20/2021 1129   MONOABS 0.5 06/20/2021 1129   EOSABS 0.1 06/20/2021 1129   BASOSABS 0.0 06/20/2021 1129    No results found for: "POCLITH", "LITHIUM"   No results found for: "PHENYTOIN", "PHENOBARB", "VALPROATE", "CBMZ"   .res Assessment: Plan:    Plan:  PDMP reviewed  Increase Risperidone 3mg  at bedtime and 1mg  to 1.5mg  in the morning.  Wellbutrin XL 300 mg daily Benztropine 0.5 mg at bedtime   Melatonin 10 mg p.o. nightly as needed - uses every night  PRN: Hydroxyzine  50mg  daily as needed Trazodone 50 mg nightly as needed for sleep  Will plan to get an updated EKG - documentation given - plans to call Dr Alain Marion office for appt.  ED ECG REPORT  Date: 06/01/2021 EKG Time: 1945 Rate: 83 Rhythm: normal sinus rhythm QRS Axis: normal Intervals: normal ST/T Wave abnormalities: normal Narrative Interpretation: no evidence of acute ischemia; no significant change when compared to EKG of 12/28/2011  EKG-dated 06/04/2021-normal sinus rhythm.  QTc-444.  Time spent with patient was 25 minutes. Greater than 50% of face to face time with patient was spent on counseling and coordination of care.    RTC 4 weeks  Patient advised to contact office with any questions, adverse effects, or acute worsening in signs and symptoms.   Discussed potential metabolic side effects associated with atypical antipsychotics, as well as potential risk for movement side effects. Advised pt to contact office if movement side effects occur.    There are no diagnoses linked to this encounter.   Please see After Visit Summary for patient specific instructions.  No future appointments.  No orders of the defined types were placed in this encounter.     -------------------------------

## 2022-05-28 ENCOUNTER — Other Ambulatory Visit: Payer: Self-pay | Admitting: Internal Medicine

## 2022-06-19 ENCOUNTER — Other Ambulatory Visit: Payer: Self-pay | Admitting: Adult Health

## 2022-06-19 DIAGNOSIS — Z91148 Patient's other noncompliance with medication regimen for other reason: Secondary | ICD-10-CM

## 2022-06-24 ENCOUNTER — Other Ambulatory Visit: Payer: Self-pay | Admitting: Adult Health

## 2022-06-24 ENCOUNTER — Telehealth: Payer: Self-pay | Admitting: Adult Health

## 2022-06-24 DIAGNOSIS — Z91148 Patient's other noncompliance with medication regimen for other reason: Secondary | ICD-10-CM

## 2022-06-24 NOTE — Telephone Encounter (Signed)
LVM to leave a message and let us know if she has been compliant with the medication.

## 2022-06-24 NOTE — Telephone Encounter (Signed)
We can send in a 30 day supply if she has been taking consistently, otherwise needs an appt first.

## 2022-06-24 NOTE — Telephone Encounter (Signed)
Patient asking for RF of risperdal. Note on last request said no future RF and there is a note about noncompliance with medication. She has only been seen twice, the last in October and she has no appt scheduled.  Are we doing to just refuse the RF, or dismiss her from practice?

## 2022-06-24 NOTE — Telephone Encounter (Signed)
Patient lvm stating Risperidone is out and she is in need of a refill. She was last seen on 02/26/22 with no follow up scheduled. Contact # [phone]

## 2022-06-25 NOTE — Telephone Encounter (Signed)
Rx sent, appt made.

## 2022-06-25 NOTE — Telephone Encounter (Signed)
I called Tonya Watkins this am, because she has called and left a message twice about her refill. Apparently did not get the nurse's message with question. [name] did state that she is compliant with taking the Risperal and consistently has taken it. She is out today, so very concerned to get it called in. I also made her a follow up appt in 30 days.

## 2022-06-25 NOTE — Telephone Encounter (Signed)
LVM to please call the office and let us know if she has been compliant with taking risperidone.

## 2022-07-01 ENCOUNTER — Other Ambulatory Visit: Payer: Self-pay | Admitting: Internal Medicine

## 2022-07-19 ENCOUNTER — Encounter: Payer: Self-pay | Admitting: Adult Health

## 2022-07-19 ENCOUNTER — Telehealth (INDEPENDENT_AMBULATORY_CARE_PROVIDER_SITE_OTHER): Payer: No Typology Code available for payment source | Admitting: Adult Health

## 2022-07-19 DIAGNOSIS — F3341 Major depressive disorder, recurrent, in partial remission: Secondary | ICD-10-CM | POA: Diagnosis not present

## 2022-07-19 DIAGNOSIS — Z91148 Patient's other noncompliance with medication regimen for other reason: Secondary | ICD-10-CM

## 2022-07-19 DIAGNOSIS — F29 Unspecified psychosis not due to a substance or known physiological condition: Secondary | ICD-10-CM

## 2022-07-19 MED ORDER — BUPROPION HCL ER (XL) 300 MG PO TB24: 300.0000 mg | ORAL_TABLET | Freq: Every day | ORAL | 2 refills | Status: DC

## 2022-07-19 MED ORDER — RISPERIDONE 3 MG PO TBDP: 3.0000 mg | ORAL_TABLET | Freq: Two times a day (BID) | ORAL | 2 refills | Status: DC

## 2022-07-19 NOTE — Progress Notes (Signed)
[name] [id number] 10-31-1970 52 y.o.  Virtual Visit via Video Note  I connected with pt @ on 07/19/22 at  3:20 PM EST by a video enabled telemedicine application and verified that I am speaking with the correct person using two identifiers.   I discussed the limitations of evaluation and management by telemedicine and the availability of in person appointments. The patient expressed understanding and agreed to proceed.  I discussed the assessment and treatment plan with the patient. The patient was provided an opportunity to ask questions and all were answered. The patient agreed with the plan and demonstrated an understanding of the instructions.   The patient was advised to call back or seek an in-person evaluation if the symptoms worsen or if the condition fails to improve as anticipated.  I provided 25 minutes of non-face-to-face time during this encounter.  The patient was located at home.  The provider was located at [place].   Aloha Gell, NP   Subjective:   Patient ID:  Tonya Watkins is a 52 y.o. (DOB 29-Aug-1970) female.  Chief Complaint: No chief complaint on file.   HPI Tonya Watkins presents for follow-up of MDD, psychosis and insomnia.  Describes mood today as "ok". Pleasant. Tearful at times. Mood symptoms - denies depression - "sometimes, but not bad". Reports decreased anxiety - "not much". Denies irritability. Reports some worry, rumination, and over thinking. Mood is consistent - denies highs and lows. Stating "I feel like I'm doing ok except for the voice in y head 24 hours a day". Reports the voice continues to say "negative things". Feels like Risperdal is helpful, but is willing to increase the dose. Improved interest and motivation. Taking medications as prescribed. Energy levels lower. Active, does not have a regular exercise routine.  Enjoys some usual interests and activities. Divorced. Has 3 children 26, 22 and 16 at home. Mother lives  with her during the week. Spending time with family. Appetite adequate. Weight loss - 143 from 154 pounds. Sleeping difficulties - falling asleep ok - not staying asleep. Averages 6 to 8 hours of broken sleep - wakes up once or twice a night. Focus and concentration difficulties. Completing tasks. Managing aspects of household. Working full time - case Nature conservation officer.  Denies SI or HI.  Denies AH. Positive for [facility] - same female voice. Denies self harm. Denies substance use.  Previous medication trials:  Wellbutrin XL '300mg'$  for years up until hospitalization.  Visit Diagnosis: No diagnosis found.  Past Psychiatric History: Admitted in January 2023 to [facility] for psychosis.   Review of Systems:  Review of Systems  Musculoskeletal:  Negative for gait problem.  Neurological:  Negative for tremors.  Psychiatric/Behavioral:         Please refer to HPI    Medications: I have reviewed the patient's current medications.  Current Outpatient Medications  Medication Sig Dispense Refill   benztropine (COGENTIN) 0.5 MG tablet Take 1 tablet (0.5 mg total) by mouth at bedtime as needed for tremors. 30 tablet 2   buPROPion (WELLBUTRIN XL) 300 MG 24 hr tablet Take 1 tablet (300 mg total) by mouth daily. 30 tablet 2   hydrOXYzine (ATARAX) 50 MG tablet Take 1 tablet (50 mg total) by mouth 2 (two) times daily as needed for anxiety. 60 tablet 0   Melatonin 10 MG TABS Take by mouth.     risperiDONE (RISPERDAL M-TABS) 1 MG disintegrating tablet DISSOLVE 1 & 1/2 (ONE & ONE-HALF) TABLETS IN MOUTH IN  THE MORNING AND 3 TABS AT BEDTIME 137 tablet 0   traZODone (DESYREL) 50 MG tablet Take 0.5-1 tablets (25-50 mg total) by mouth at bedtime as needed for sleep. 30 tablet 0   valACYclovir (VALTREX) 500 MG tablet Take 1 tablet (500 mg total) by mouth daily. Annual appt is due must see provider for future refills 30 tablet 0   No current facility-administered medications for this visit.     Medication Side Effects: None  Allergies:  Allergies  Allergen Reactions   Mirtazapine     Weight gain    Past Medical History:  Diagnosis Date   Depression    DVT (deep venous thrombosis) (HCC)    left calf   Heart palpitations    Medical history non-contributory     Family History  Problem Relation Age of Onset   Allergies Mother    Cancer Mother        thyroid   Heart block Maternal Grandmother    Mental illness Neg Hx     Social History   Socioeconomic History   Marital status: Divorced    Spouse name: Not on file   Number of children: 3   Years of education: Not on file   Highest education level: Associate degree: occupational, Hotel manager, or vocational program  Occupational History   Occupation: Therapist, sports  Tobacco Use   Smoking status: Never   Smokeless tobacco: Never  Vaping Use   Vaping Use: Never used  Substance and Sexual Activity   Alcohol use: Not Currently   Drug use: Not Currently   Sexual activity: Not Currently  Other Topics Concern   Not on file  Social History Narrative   Not on file   Social Determinants of Health   Financial Resource Strain: Not on file  Food Insecurity: Not on file  Transportation Needs: Not on file  Physical Activity: Not on file  Stress: Not on file  Social Connections: Not on file  Intimate Partner Violence: Not on file    Past Medical History, Surgical history, Social history, and Family history were reviewed and updated as appropriate.   Please see review of systems for further details on the patient's review from today.   Objective:   Physical Exam:  There were no vitals taken for this visit.  Physical Exam Constitutional:      General: She is not in acute distress. Musculoskeletal:        General: No deformity.  Neurological:     Mental Status: She is alert and oriented to person, place, and time.     Coordination: Coordination normal.  Psychiatric:        Attention and Perception: Attention and  perception normal. She does not perceive auditory or visual hallucinations.        Mood and Affect: Mood normal. Mood is not anxious or depressed. Affect is not labile, blunt, angry or inappropriate.        Speech: Speech normal.        Behavior: Behavior normal.        Thought Content: Thought content normal. Thought content is not paranoid or delusional. Thought content does not include homicidal or suicidal ideation. Thought content does not include homicidal or suicidal plan.        Cognition and Memory: Cognition and memory normal.        Judgment: Judgment normal.     Comments: Insight intact     Lab Review:     Component Value Date/Time   NA  141 06/20/2021 1129   K 3.9 06/20/2021 1129   CL 104 06/20/2021 1129   CO2 29 06/20/2021 1129   GLUCOSE 81 06/20/2021 1129   BUN 9 06/20/2021 1129   CREATININE 0.79 06/20/2021 1129   CREATININE 0.82 12/16/2019 1614   CALCIUM 9.6 06/20/2021 1129   PROT 7.5 06/20/2021 1129   ALBUMIN 4.2 06/20/2021 1129   AST 19 06/20/2021 1129   ALT 18 06/20/2021 1129   ALKPHOS 43 06/20/2021 1129   BILITOT 0.6 06/20/2021 1129   GFRNONAA >60 06/06/2021 1150   GFRNONAA 85 12/16/2019 1614   GFRAA 98 12/16/2019 1614       Component Value Date/Time   WBC 4.7 06/20/2021 1129   RBC 3.77 (L) 06/20/2021 1129   HGB 11.6 (L) 06/20/2021 1129   HCT 35.1 (L) 06/20/2021 1129   PLT 277.0 06/20/2021 1129   MCV 93.0 06/20/2021 1129   MCH 31.3 06/06/2021 1150   MCHC 33.0 06/20/2021 1129   RDW 12.7 06/20/2021 1129   LYMPHSABS 1.1 06/20/2021 1129   MONOABS 0.5 06/20/2021 1129   EOSABS 0.1 06/20/2021 1129   BASOSABS 0.0 06/20/2021 1129    No results found for: "POCLITH", "LITHIUM"   No results found for: "PHENYTOIN", "PHENOBARB", "VALPROATE", "CBMZ"   .res Assessment: Plan:   Plan:  PDMP reviewed  Increase Risperidone '3mg'$  at bedtime and 1.'5mg'$  in the morning to '3mg'$  BID. Wellbutrin XL 300 mg daily Benztropine 0.5 mg at bedtime   Melatonin 10 mg p.o.  nightly as needed - uses every night  PRN: Hydroxyzine '50mg'$  daily as needed Trazodone 50 mg nightly as needed for sleep  Will plan to get an updated EKG - documentation given - plans to call Dr Alain Marion office for appt.  ED ECG REPORT  Date: 06/01/2021 EKG Time: 1945 Rate: 83 Rhythm: normal sinus rhythm QRS Axis: normal Intervals: normal ST/T Wave abnormalities: normal Narrative Interpretation: no evidence of acute ischemia; no significant change when compared to EKG of 12/28/2011  EKG-dated 06/04/2021-normal sinus rhythm.  QTc-444.  Time spent with patient was 25 minutes. Greater than 50% of face to face time with patient was spent on counseling and coordination of care.    RTC 4 weeks  Patient advised to contact office with any questions, adverse effects, or acute worsening in signs and symptoms.   Discussed potential metabolic side effects associated with atypical antipsychotics, as well as potential risk for movement side effects. Advised pt to contact office if movement side effects occur.    There are no diagnoses linked to this encounter.   Please see After Visit Summary for patient specific instructions.  Future Appointments  Date Time Provider [place]  07/19/2022  3:20 PM Chairty Toman, Berdie Ogren, NP CP-CP None    No orders of the defined types were placed in this encounter.     -------------------------------

## 2022-08-05 ENCOUNTER — Other Ambulatory Visit: Payer: Self-pay | Admitting: Internal Medicine

## 2022-08-11 ENCOUNTER — Other Ambulatory Visit: Payer: Self-pay | Admitting: Internal Medicine

## 2022-08-13 ENCOUNTER — Telehealth: Payer: Self-pay | Admitting: Adult Health

## 2022-08-13 NOTE — Telephone Encounter (Signed)
Pt called and said that the medication was increased at last visit. She is still hearing voices and feels that she might need another increase of the medicine. Was last seen 07/19/22 no appt scheduled was due back in April. Please give her a call back at [phone]

## 2022-08-14 NOTE — Telephone Encounter (Signed)
Patient left message that she was still hearing voices with Risperdal 3 mg BID. Increase was started 3/8. She reports still hearing voices. They are not telling her to harm herself or do bad things. She is asking if increasing the dose again would help.

## 2022-08-14 NOTE — Telephone Encounter (Signed)
Please call to schedule an appt. RTC was 4/5.

## 2022-08-14 NOTE — Telephone Encounter (Signed)
Does she have a follow up appt scheduled?

## 2022-08-14 NOTE — Telephone Encounter (Signed)
LVM to RC 

## 2022-08-14 NOTE — Telephone Encounter (Signed)
Pt is scheduled 4/26

## 2022-08-14 NOTE — Telephone Encounter (Signed)
We can go ahead and try increasing dose to 4mg  bid.

## 2022-08-14 NOTE — Telephone Encounter (Signed)
She is scheduled 4/26.

## 2022-08-15 MED ORDER — RISPERIDONE 4 MG PO TBDP: 4.0000 mg | ORAL_TABLET | Freq: Two times a day (BID) | ORAL | 0 refills | Status: DC

## 2022-08-15 NOTE — Telephone Encounter (Signed)
Rx sent. Patient notified. 

## 2022-08-17 ENCOUNTER — Other Ambulatory Visit: Payer: Self-pay | Admitting: Internal Medicine

## 2022-08-23 ENCOUNTER — Other Ambulatory Visit: Payer: Self-pay | Admitting: Adult Health

## 2022-08-26 MED ORDER — RISPERIDONE 1 MG PO TBDP: 1.0000 mg | ORAL_TABLET | Freq: Every day | ORAL | 0 refills | Status: DC

## 2022-08-26 NOTE — Telephone Encounter (Signed)
Received after hours call from pt. Pt reports that she has been experiencing AH and "feel like I am going crazy." Denies SI or HI. She reports that provider had increased her Risperdal ODT dose from 3 mg to 4 mg. She reports that pharmacy does not have Risperdal ODT 4 mg in stock and advised that it is on backorder. She reports that she asked if pharmacy would fill the 3 mg until she could follow-up with provider and they reported that they no longer had an order for the 3 mg dose since dose was increased to 4 mg. She reports that she prefers ODT because that is what she has always taken. Reports that she is willing to take a combination of ODT tabs to equal 4 mg or 4 mg regular tabs if ODT is not available. Contacted pt's pharmacy and they confirmed that Risperdal ODT 4 mg is not available and that they did not have 2 mg in stock for her to take 2 tabs. Pharmacist reported they had 3 mg and 1 mg Risperdal ODT. Provided verbal order for Risperdal ODT 3 mg po QHS with 1 mg po QHS to equal total dose of 4 mg QHS. Contacted pt to discuss what had been called in. Advised that pharmacist had said they would contact provider if PA was needed. Pt verbalized understanding. Advised pt to call back if unable to obtain medication.

## 2022-08-26 NOTE — Telephone Encounter (Signed)
Noted. Ty!

## 2022-08-30 ENCOUNTER — Telehealth (INDEPENDENT_AMBULATORY_CARE_PROVIDER_SITE_OTHER): Payer: No Typology Code available for payment source | Admitting: Adult Health

## 2022-08-30 ENCOUNTER — Encounter: Payer: Self-pay | Admitting: Adult Health

## 2022-08-30 DIAGNOSIS — G4700 Insomnia, unspecified: Secondary | ICD-10-CM

## 2022-08-30 DIAGNOSIS — F3341 Major depressive disorder, recurrent, in partial remission: Secondary | ICD-10-CM | POA: Diagnosis not present

## 2022-08-30 DIAGNOSIS — F29 Unspecified psychosis not due to a substance or known physiological condition: Secondary | ICD-10-CM

## 2022-08-30 DIAGNOSIS — I493 Ventricular premature depolarization: Secondary | ICD-10-CM

## 2022-08-30 NOTE — Progress Notes (Signed)
Tonya Watkins [id number] [birth year] 52 y.o.  Virtual Visit via Video Note  I connected with pt @ on 08/30/22 at  4:00 PM EDT by a video enabled telemedicine application and verified that I am speaking with the correct person using two identifiers.   I discussed the limitations of evaluation and management by telemedicine and the availability of in person appointments. The patient expressed understanding and agreed to proceed.  I discussed the assessment and treatment plan with the patient. The patient was provided an opportunity to ask questions and all were answered. The patient agreed with the plan and demonstrated an understanding of the instructions.   The patient was advised to call back or seek an in-person evaluation if the symptoms worsen or if the condition fails to improve as anticipated.  I provided 15 minutes of non-face-to-face time during this encounter.  The patient was located at home.  The provider was located at [facility].   Dorothyann Gibbs, NP   Subjective:   Patient ID:  Tonya Watkins is a 52 y.o. (DOB October 31, 1970) female.  Chief Complaint: No chief complaint on file.   HPI Rogena Milligan presents for follow-up of MDD, psychosis and insomnia.  Describes mood today as "a little better". Pleasant. Tearful at times. Mood symptoms - denies depression and irritability. Feels anxious "sometimes". Denies worry, rumination, and over thinking. Denies obsessive thoughts and acts. Mood is consistent. Stating "I'm still hearing the voice in my head". Reports the voice continues to say negative things - "wanting to be distracting". Feels like the recent increase in Risperdal has been helpful. Stating "things are a little less noisy - not as loud as it used to be". Willing to consider further increase in Risperdal. Improved interest and motivation. Taking medications as prescribed. Energy levels lower. Active, does not have a regular exercise routine.  Enjoys some usual  interests and activities. Divorced. Has 3 children 26, 22 and 16 at home. Mother lives with her during the week. Spending time with family. Appetite adequate. Weight stable - 145 pounds. Sleeping difficulties. Averages 6 hours of broken sleep - some nights 7 or 8 hours. Denies daytime napping.  Focus and concentration difficulties at times - feels distracted. Completing tasks. Managing aspects of household. 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  Reports AH. Hearing a female voice. Trying to control my body, thoughts, and speech. Denies VH  Denies self harm. Denies substance use.  Previous medication trials:  Wellbutrin XL  for years up until hospitalization.  Visit Diagnosis: No diagnosis found.  Past Psychiatric History: Admitted in January 2023 to [facility] for psychosis.    Review of Systems:  Review of Systems  Musculoskeletal:  Negative for gait problem.  Neurological:  Negative for tremors.  Psychiatric/Behavioral:         Please refer to HPI    Medications: I have reviewed the patient's current medications.  Current Outpatient Medications  Medication Sig Dispense Refill   benztropine (COGENTIN) 0.5 MG tablet Take 1 tablet (0.5 mg total) by mouth at bedtime as needed for tremors. 30 tablet 2   buPROPion (WELLBUTRIN XL) 300 MG 24 hr tablet Take 1 tablet (300 mg total) by mouth daily. 30 tablet 2   hydrOXYzine (ATARAX) 50 MG tablet Take 1 tablet (50 mg total) by mouth 2 (two) times daily as needed for anxiety. 60 tablet 0   Melatonin 10 MG TABS Take by mouth.     risperiDONE (RISPERDAL M-TABS) 1  MG disintegrating tablet Take 1 tablet (1 mg total) by mouth at bedtime. Take with 3 mg tab to equal total dose of 4 mg 30 tablet 0   risperiDONE (RISPERDAL M-TABS) 3 MG disintegrating tablet Take 1 tablet (3 mg total) by mouth at bedtime. Take with 1 mg tab to equal total dose of 4 mg. 30 tablet 0   traZODone (DESYREL) 50 MG tablet Take  0.5-1 tablets (25-50 mg total) by mouth at bedtime as needed for sleep. 30 tablet 0   valACYclovir (VALTREX) 500 MG tablet Take 1 tablet (500 mg total) by mouth daily. Annual appt is due must see provider for future refills 30 tablet 0   No current facility-administered medications for this visit.    Medication Side Effects: None  Allergies:  Allergies  Allergen Reactions   Mirtazapine     Weight gain    Past Medical History:  Diagnosis Date   Depression    DVT (deep venous thrombosis) (HCC)    left calf   Heart palpitations    Medical history non-contributory     Family History  Problem Relation Age of Onset   Allergies Mother    Cancer Mother        thyroid   Heart block Maternal Grandmother    Mental illness Neg Hx     Social History   Socioeconomic History   Marital status: Divorced    Spouse name: Not on file   Number of children: 3   Years of education: Not on file   Highest education level: Associate degree: occupational, Scientist, product/process development, or vocational program  Occupational History   Occupation: Charity fundraiser  Tobacco Use   Smoking status: Never   Smokeless tobacco: Never  Vaping Use   Vaping Use: Never used  Substance and Sexual Activity   Alcohol use: Not Currently   Drug use: Not Currently   Sexual activity: Not Currently  Other Topics Concern   Not on file  Social History Narrative   Not on file   Social Determinants of Health   Financial Resource Strain: Not on file  Food Insecurity: Not on file  Transportation Needs: Not on file  Physical Activity: Not on file  Stress: Not on file  Social Connections: Not on file  Intimate Partner Violence: Not on file    Past Medical History, Surgical history, Social history, and Family history were reviewed and updated as appropriate.   Please see review of systems for further details on the patient's review from today.   Objective:   Physical Exam:  There were no vitals taken for this visit.  Physical  Exam Constitutional:      General: She is not in acute distress. Musculoskeletal:        General: No deformity.  Neurological:     Mental Status: She is alert and oriented to person, place, and time.     Coordination: Coordination normal.  Psychiatric:        Attention and Perception: Attention and perception normal. She does not perceive auditory or visual hallucinations.        Mood and Affect: Mood normal. Mood is not anxious or depressed. Affect is not labile, blunt, angry or inappropriate.        Speech: Speech normal.        Behavior: Behavior normal.        Thought Content: Thought content normal. Thought content is not paranoid or delusional. Thought content does not include homicidal or suicidal ideation. Thought content does not  include homicidal or suicidal plan.        Cognition and Memory: Cognition and memory normal.        Judgment: Judgment normal.     Comments: Insight intact     Lab Review:     Component Value Date/Time   NA 141 06/20/2021 1129   K 3.9 06/20/2021 1129   CL 104 06/20/2021 1129   CO2 29 06/20/2021 1129   GLUCOSE 81 06/20/2021 1129   BUN 9 06/20/2021 1129   CREATININE 0.79 06/20/2021 1129   CREATININE 0.82 12/16/2019 1614   CALCIUM 9.6 06/20/2021 1129   PROT 7.5 06/20/2021 1129   ALBUMIN 4.2 06/20/2021 1129   AST 19 06/20/2021 1129   ALT 18 06/20/2021 1129   ALKPHOS 43 06/20/2021 1129   BILITOT 0.6 06/20/2021 1129   GFRNONAA >60 06/06/2021 1150   GFRNONAA 85 12/16/2019 1614   GFRAA 98 12/16/2019 1614       Component Value Date/Time   WBC 4.7 06/20/2021 1129   RBC 3.77 (L) 06/20/2021 1129   HGB 11.6 (L) 06/20/2021 1129   HCT 35.1 (L) 06/20/2021 1129   PLT 277.0 06/20/2021 1129   MCV 93.0 06/20/2021 1129   MCH 31.3 06/06/2021 1150   MCHC 33.0 06/20/2021 1129   RDW 12.7 06/20/2021 1129   LYMPHSABS 1.1 06/20/2021 1129   MONOABS 0.5 06/20/2021 1129   EOSABS 0.1 06/20/2021 1129   BASOSABS 0.0 06/20/2021 1129    No results found  for: "POCLITH", "LITHIUM"   No results found for: "PHENYTOIN", "PHENOBARB", "VALPROATE", "CBMZ"   .res Assessment: Plan:    Plan:  PDMP reviewed  Risperidone 4mg  BID - will consider further increase once EKG results received. Wellbutrin XL 300 mg daily Benztropine 0.5 mg at bedtime   Melatonin 10 mg p.o. nightly as needed - uses every night  PRN: Hydroxyzine 50mg  daily as needed Trazodone 50 mg nightly as needed for sleep  Will plan to get an updated EKG - documentation given - plans to call Dr Posey Rea office for appt.  ED ECG REPORT  Date: 06/01/2021 EKG Time: 1945 Rate: 83 Rhythm: normal sinus rhythm QRS Axis: normal Intervals: normal ST/T Wave abnormalities: normal Narrative Interpretation: no evidence of acute ischemia; no significant change when compared to EKG of 12/28/2011  EKG-dated 2023-normal sinus rhythm.    Time spent with patient was 25 minutes. Greater than 50% of face to face time with patient was spent on counseling and coordination of care.    RTC 4 weeks  Patient advised to contact office with any questions, adverse effects, or acute worsening in signs and symptoms.   Discussed potential metabolic side effects associated with atypical antipsychotics, as well as potential risk for movement side effects. Advised pt to contact office if movement side effects occur.   There are no diagnoses linked to this encounter.   Please see After Visit Summary for patient specific instructions.  Future Appointments  Date Time Provider Department Center  08/30/2022  4:00 PM [place] Hollar, Thereasa Solo, NP CP-CP None    No orders of the defined types were placed in this encounter.     -------------------------------

## 2022-09-04 ENCOUNTER — Telehealth: Payer: Self-pay | Admitting: Adult Health

## 2022-09-04 NOTE — Telephone Encounter (Signed)
[facility] in [place] said they filled #29 of 1 mg on 4/13 and #28 of 3 mg on 4/13. Patient is to have an EKG and based on results may increase dose.

## 2022-09-04 NOTE — Telephone Encounter (Signed)
Noted. Ty!

## 2022-09-04 NOTE — Telephone Encounter (Signed)
Provider asked patient to get an ECG to determine if dose of Risperdal could be increased.  Patient has an appt with PCP and will get ECG tomorrow. I told her I wouldn't send in Rx right now and would wait for results of ECG. We will send in Rx either way, just want to wait since patient is not out. It looks like Shanda Bumps called in Rx for both doses on 4/15.

## 2022-09-04 NOTE — Telephone Encounter (Signed)
Pt called requesting Risperidone 4 mg tabs. Had to get 3 mg & 1 mg due to pharamacy out of 4 mg. Only sent #30. [facility] [address] [place] . Apt due 5/17

## 2022-09-05 ENCOUNTER — Encounter: Payer: Self-pay | Admitting: Internal Medicine

## 2022-09-05 ENCOUNTER — Ambulatory Visit (INDEPENDENT_AMBULATORY_CARE_PROVIDER_SITE_OTHER): Payer: No Typology Code available for payment source | Admitting: Internal Medicine

## 2022-09-05 VITALS — BP 128/82 | HR 66 | Temp 98.9°F | Ht 63.0 in | Wt 152.3 lb

## 2022-09-05 DIAGNOSIS — Z780 Asymptomatic menopausal state: Secondary | ICD-10-CM

## 2022-09-05 DIAGNOSIS — Z79899 Other long term (current) drug therapy: Secondary | ICD-10-CM

## 2022-09-05 DIAGNOSIS — Z5181 Encounter for therapeutic drug level monitoring: Secondary | ICD-10-CM | POA: Diagnosis not present

## 2022-09-05 DIAGNOSIS — Z0000 Encounter for general adult medical examination without abnormal findings: Secondary | ICD-10-CM | POA: Diagnosis not present

## 2022-09-05 DIAGNOSIS — F32A Depression, unspecified: Secondary | ICD-10-CM

## 2022-09-05 LAB — CBC WITH DIFFERENTIAL/PLATELET
Basophils Absolute: 0 10*3/uL (ref 0.0–0.1)
Basophils Relative: 0.5 % (ref 0.0–3.0)
Eosinophils Absolute: 0.4 10*3/uL (ref 0.0–0.7)
Eosinophils Relative: 6.2 % — ABNORMAL HIGH (ref 0.0–5.0)
HCT: 38.8 % (ref 36.0–46.0)
Hemoglobin: 13.2 g/dL (ref 12.0–15.0)
Lymphocytes Relative: 21.9 % (ref 12.0–46.0)
Lymphs Abs: 1.3 10*3/uL (ref 0.7–4.0)
MCHC: 34.1 g/dL (ref 30.0–36.0)
MCV: 98.5 fl (ref 78.0–100.0)
Monocytes Absolute: 0.4 10*3/uL (ref 0.1–1.0)
Monocytes Relative: 7.2 % (ref 3.0–12.0)
Neutro Abs: 3.7 10*3/uL (ref 1.4–7.7)
Neutrophils Relative %: 64.2 % (ref 43.0–77.0)
Platelets: 260 10*3/uL (ref 150.0–400.0)
RBC: 3.94 Mil/uL (ref 3.87–5.11)
RDW: 13.6 % (ref 11.5–15.5)
WBC: 5.8 10*3/uL (ref 4.0–10.5)

## 2022-09-05 LAB — COMPREHENSIVE METABOLIC PANEL
ALT: 27 U/L (ref 0–35)
AST: 21 U/L (ref 0–37)
Albumin: 4.6 g/dL (ref 3.5–5.2)
Alkaline Phosphatase: 49 U/L (ref 39–117)
BUN: 9 mg/dL (ref 6–23)
CO2: 30 mEq/L (ref 19–32)
Calcium: 9.9 mg/dL (ref 8.4–10.5)
Chloride: 103 mEq/L (ref 96–112)
Creatinine, Ser: 0.81 mg/dL (ref 0.40–1.20)
GFR: 84.4 mL/min (ref >60.00)
Glucose, Bld: 75 mg/dL (ref 70–99)
Potassium: 3.7 mEq/L (ref 3.5–5.1)
Sodium: 140 mEq/L (ref 135–145)
Total Bilirubin: 0.6 mg/dL (ref 0.2–1.2)
Total Protein: 8.1 g/dL (ref 6.0–8.3)

## 2022-09-05 LAB — URINALYSIS, ROUTINE W REFLEX MICROSCOPIC
Bilirubin Urine: NEGATIVE
Hgb urine dipstick: NEGATIVE
Ketones, ur: NEGATIVE
Nitrite: NEGATIVE
RBC / HPF: NONE SEEN (ref 0–?)
Specific Gravity, Urine: <=1.005 — AB (ref 1.000–1.030)
Total Protein, Urine: NEGATIVE
Urine Glucose: NEGATIVE
Urobilinogen, UA: 0.2 (ref 0.0–1.0)
pH: 7.5 (ref 5.0–8.0)

## 2022-09-05 LAB — LIPID PANEL
Cholesterol: 155 mg/dL (ref 0–200)
HDL: 82.8 mg/dL (ref >39.00)
LDL Cholesterol: 66 mg/dL (ref 0–99)
NonHDL: 71.79
Total CHOL/HDL Ratio: 2
Triglycerides: 27 mg/dL (ref 0.0–149.0)
VLDL: 5.4 mg/dL (ref 0.0–40.0)

## 2022-09-05 LAB — TSH: TSH: 0.68 u[IU]/mL (ref 0.35–5.50)

## 2022-09-05 LAB — VITAMIN D 25 HYDROXY (VIT D DEFICIENCY, FRACTURES): VITD: 92.5 ng/mL (ref 30.00–100.00)

## 2022-09-05 LAB — FOLLICLE STIMULATING HORMONE: FSH: 3.6 m[IU]/mL

## 2022-09-05 LAB — VITAMIN B12: Vitamin B-12: >1500 pg/mL — ABNORMAL HIGH (ref 211–911)

## 2022-09-05 NOTE — Assessment & Plan Note (Signed)
F/u w/N.Mozingo, NP Cont on Cogentin, Trazodone, Atarax, Risperdal Needs an EKG for Risperidone dose increase

## 2022-09-05 NOTE — Assessment & Plan Note (Addendum)
We discussed age appropriate health related issues, including available/recomended screening tests and vaccinations. We discussed a need for adhering to healthy diet and exercise. Labs were ordered to be later reviewed . All questions were answered. Vegan since  2019  Vit D, MVI MVI; 2024 - piscaterian diet  GYN q 12 mo Mammo periodically q1-2 years Colonoscopy versus Cologuard discussed 2023 2024 - cologuard - in progress

## 2022-09-05 NOTE — Telephone Encounter (Signed)
Pt called at 2:30p.  She wanted to advise Almira Coaster that she went and got an EKG today.  She said she need a refill of Risperdone.  She thinks Almira Coaster was going to increase it to , but wanted her to have the EKG done first.  She wants the script sent to [facility] on [address] in [place].  No upcoming appt scheduled.

## 2022-09-05 NOTE — Assessment & Plan Note (Addendum)
F/u w/N.Mozingo, NP Cont on Cogentin, Trazodone, Atarax, Risperdal OK to increase Risperidone dose

## 2022-09-05 NOTE — Progress Notes (Signed)
Subjective:  Patient ID: Tonya Watkins, female    DOB: 22-Oct-1970  Age: 52 y.o. MRN: [id number]  CC: No chief complaint on file.   HPI Sean Owusu presents for a well exam Needs an EKG for Risperidone dose increase  Outpatient Medications Prior to Visit  Medication Sig Dispense Refill   benztropine (COGENTIN) 0.5 MG tablet Take 1 tablet (0.5 mg total) by mouth at bedtime as needed for tremors. 30 tablet 2   buPROPion (WELLBUTRIN XL) 300 MG 24 hr tablet Take 1 tablet (300 mg total) by mouth daily. 30 tablet 2   hydrOXYzine (ATARAX) 50 MG tablet Take 1 tablet (50 mg total) by mouth 2 (two) times daily as needed for anxiety. 60 tablet 0   Melatonin 10 MG TABS Take by mouth.     risperiDONE (RISPERDAL M-TABS) 1 MG disintegrating tablet Take 1 tablet (1 mg total) by mouth at bedtime. Take with 3 mg tab to equal total dose of 4 mg 30 tablet 0   risperiDONE (RISPERDAL M-TABS) 3 MG disintegrating tablet Take 1 tablet (3 mg total) by mouth at bedtime. Take with 1 mg tab to equal total dose of 4 mg. 30 tablet 0   traZODone (DESYREL) 50 MG tablet Take 0.5-1 tablets (25-50 mg total) by mouth at bedtime as needed for sleep. 30 tablet 0   valACYclovir (VALTREX) 500 MG tablet Take 1 tablet (500 mg total) by mouth daily. Annual appt is due must see provider for future refills 30 tablet 0   No facility-administered medications prior to visit.    ROS: Review of Systems  Constitutional:  Positive for fatigue. Negative for activity change, appetite change, chills and unexpected weight change.  HENT:  Negative for congestion, mouth sores and sinus pressure.   Eyes:  Negative for visual disturbance.  Respiratory:  Negative for cough and chest tightness.   Gastrointestinal:  Negative for abdominal pain and nausea.  Genitourinary:  Negative for difficulty urinating, frequency and vaginal pain.  Musculoskeletal:  Negative for back pain and gait problem.  Skin:  Negative for pallor and rash.   Neurological:  Negative for dizziness, tremors, weakness, numbness and headaches.  Psychiatric/Behavioral:  Positive for sleep disturbance. Negative for confusion. The patient is nervous/anxious.     Objective:  BP 128/82 (BP Location: Left Arm, Patient Position: Sitting, Cuff Size: Normal)   Pulse 66   Temp 98.9 F (37.2 C) (Oral)   Ht  (1.6 m)   Wt 152 lb 4.8 oz (69.1 kg)   SpO2 98%   BMI 26.98 kg/m   BP Readings from Last 3 Encounters:  09/05/22 128/82  06/20/21 116/74  06/07/21 127/86    Wt Readings from Last 3 Encounters:  09/05/22 152 lb 4.8 oz (69.1 kg)  06/20/21 153 lb 6.4 oz (69.6 kg)  06/06/21 150 lb (68 kg)    Physical Exam Constitutional:      General: She is not in acute distress.    Appearance: Normal appearance. She is well-developed.  HENT:     Head: Normocephalic.     Right Ear: External ear normal.     Left Ear: External ear normal.     Nose: Nose normal.  Eyes:     General:        Right eye: No discharge.        Left eye: No discharge.     Conjunctiva/sclera: Conjunctivae normal.     Pupils: Pupils are equal, round, and reactive to light.  Neck:  Thyroid: No thyromegaly.     Vascular: No JVD.     Trachea: No tracheal deviation.  Cardiovascular:     Rate and Rhythm: Normal rate and regular rhythm.     Heart sounds: Normal heart sounds.  Pulmonary:     Effort: No respiratory distress.     Breath sounds: No stridor. No wheezing.  Abdominal:     General: Bowel sounds are normal. There is no distension.     Palpations: Abdomen is soft. There is no mass.     Tenderness: There is no abdominal tenderness. There is no guarding or rebound.  Musculoskeletal:        General: No tenderness.     Cervical back: Normal range of motion and neck supple. No rigidity.  Lymphadenopathy:     Cervical: No cervical adenopathy.  Skin:    Findings: No erythema or rash.  Neurological:     Cranial Nerves: No cranial nerve deficit.     Motor: No  abnormal muscle tone.     Coordination: Coordination normal.     Deep Tendon Reflexes: Reflexes normal.  Psychiatric:        Behavior: Behavior normal.        Thought Content: Thought content normal.        Judgment: Judgment normal.    Procedure: EKG Indication: Needs an EKG for Risperidone dose increase Impression: S brady. No acute changes.  Lab Results  Component Value Date   WBC 4.7 06/20/2021   HGB 11.6 (L) 06/20/2021   HCT 35.1 (L) 06/20/2021   PLT 277.0 06/20/2021   GLUCOSE 81 06/20/2021   CHOL 147 06/20/2021   TRIG 40.0 06/20/2021   HDL 73.10 06/20/2021   LDLCALC 65 06/20/2021   ALT 18 06/20/2021   AST 19 06/20/2021   NA 141 06/20/2021   K 3.9 06/20/2021   CL 104 06/20/2021   CREATININE 0.79 06/20/2021   BUN 9 06/20/2021   CO2 29 06/20/2021   TSH 0.85 06/20/2021   INR 2.1 07/03/2012   HGBA1C 5.5 06/04/2021    No results found.  Assessment & Plan:   Problem List Items Addressed This Visit     Depressive disorder    F/u w/N.Mozingo, NP Cont on Cogentin, Trazodone, Atarax, Risperdal Needs an EKG for Risperidone dose increase      Well adult exam - Primary    We discussed age appropriate health related issues, including available/recomended screening tests and vaccinations. We discussed a need for adhering to healthy diet and exercise. Labs were ordered to be later reviewed . All questions were answered. Vegan since  2019  Vit D, MVI GYN q 12 mo Mammo periodically q1-2 years Colonoscopy versus Cologuard discussed 2023 2024 - cologuard - in progress      Relevant Orders   CBC with Differential/Platelet   Comprehensive metabolic panel   Iron, TIBC and Ferritin Panel   Vitamin B12   VITAMIN D 25 Hydroxy (Vit-D Deficiency, Fractures)   Urinalysis   TSH   Lipid panel   Therapeutic drug monitoring    F/u w/N.Mozingo, NP Cont on Cogentin, Trazodone, Atarax, Risperdal OK to increase Risperidone dose       Relevant Orders   CBC with  Differential/Platelet   Iron, TIBC and Ferritin Panel   Vitamin B12   VITAMIN D 25 Hydroxy (Vit-D Deficiency, Fractures)   Other Visit Diagnoses     Medication increased       Relevant Orders   EKG 12-Lead  No orders of the defined types were placed in this encounter.     Follow-up: No follow-ups on file.  Walker Kehr, MD

## 2022-09-06 ENCOUNTER — Other Ambulatory Visit: Payer: Self-pay

## 2022-09-06 ENCOUNTER — Telehealth: Payer: Self-pay | Admitting: *Deleted

## 2022-09-06 LAB — IRON,TIBC AND FERRITIN PANEL
%SAT: 20 % (calc) (ref 16–45)
Ferritin: 28 ng/mL (ref 16–232)
Iron: 81 ug/dL (ref 45–160)
TIBC: 405 mcg/dL (calc) (ref 250–450)

## 2022-09-06 MED ORDER — RISPERIDONE 1 MG PO TBDP: 2.0000 mg | ORAL_TABLET | Freq: Two times a day (BID) | ORAL | 0 refills | Status: DC

## 2022-09-06 MED ORDER — RISPERIDONE 3 MG PO TBDP: 3.0000 mg | ORAL_TABLET | Freq: Two times a day (BID) | ORAL | 0 refills | Status: DC

## 2022-09-06 NOTE — Telephone Encounter (Signed)
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

## 2022-09-07 NOTE — Telephone Encounter (Signed)
It was a concentrated urine specimen.  Squamous epithelial cells come from the urethra and vagina lining. Next time, it is best to do a "midstream urine catch" and hydrate well prior to the test. Thanks, AP

## 2022-09-09 NOTE — Telephone Encounter (Signed)
Called pt no answer. Sent pt mychart msg w/MD response../l;mb

## 2022-09-15 ENCOUNTER — Other Ambulatory Visit: Payer: Self-pay | Admitting: Psychiatry

## 2022-09-15 DIAGNOSIS — F29 Unspecified psychosis not due to a substance or known physiological condition: Secondary | ICD-10-CM

## 2022-09-16 ENCOUNTER — Telehealth: Payer: Self-pay | Admitting: Adult Health

## 2022-09-16 ENCOUNTER — Encounter (HOSPITAL_COMMUNITY): Payer: Self-pay

## 2022-09-16 DIAGNOSIS — F29 Unspecified psychosis not due to a substance or known physiological condition: Secondary | ICD-10-CM

## 2022-09-16 MED ORDER — BENZTROPINE MESYLATE 0.5 MG PO TABS
0.5000 mg | ORAL_TABLET | Freq: Every evening | ORAL | 0 refills | Status: DC | PRN
Start: 2022-09-16 — End: 2022-10-09

## 2022-09-16 NOTE — Telephone Encounter (Signed)
Sent!

## 2022-09-16 NOTE — Telephone Encounter (Signed)
PT called at 1pm. She is requesting a RF on her Congentin RX. Please send to [facility] at [address] [place], [place] [address]

## 2022-09-20 ENCOUNTER — Telehealth: Payer: Self-pay | Admitting: Adult Health

## 2022-09-20 DIAGNOSIS — F29 Unspecified psychosis not due to a substance or known physiological condition: Secondary | ICD-10-CM

## 2022-09-20 MED ORDER — BUPROPION HCL ER (XL) 300 MG PO TB24
300.0000 mg | ORAL_TABLET | Freq: Every day | ORAL | 2 refills | Status: DC
Start: 2022-09-20 — End: 2023-01-19

## 2022-09-20 NOTE — Telephone Encounter (Signed)
Sent!

## 2022-09-20 NOTE — Telephone Encounter (Signed)
Pt called and she needs a refill on her wellbutrin xl 300 mg. The [facility]  at [address] in [place]

## 2022-09-23 ENCOUNTER — Encounter: Payer: Self-pay | Admitting: Internal Medicine

## 2022-09-27 ENCOUNTER — Telehealth: Payer: Self-pay | Admitting: Adult Health

## 2022-09-27 NOTE — Telephone Encounter (Signed)
Pt called and said that her insurance wants a 90 day supply of the respiridone 1 mg and 3 mg. Pharmacy was suppose to have sent a request for that. [facility] [address] In [place]

## 2022-09-30 MED ORDER — RISPERIDONE 1 MG PO TBDP: 1.0000 mg | ORAL_TABLET | Freq: Two times a day (BID) | ORAL | 0 refills | Status: DC

## 2022-09-30 MED ORDER — RISPERIDONE 4 MG PO TBDP: 4.0000 mg | ORAL_TABLET | Freq: Two times a day (BID) | ORAL | 0 refills | Status: DC

## 2022-09-30 NOTE — Telephone Encounter (Signed)
Sent Rx for 1 and 4 mg to = 5 mg

## 2022-10-01 ENCOUNTER — Telehealth: Payer: No Typology Code available for payment source | Admitting: Adult Health

## 2022-10-01 ENCOUNTER — Encounter: Payer: Self-pay | Admitting: Adult Health

## 2022-10-01 DIAGNOSIS — F3341 Major depressive disorder, recurrent, in partial remission: Secondary | ICD-10-CM | POA: Diagnosis not present

## 2022-10-01 DIAGNOSIS — G4700 Insomnia, unspecified: Secondary | ICD-10-CM | POA: Diagnosis not present

## 2022-10-01 DIAGNOSIS — F29 Unspecified psychosis not due to a substance or known physiological condition: Secondary | ICD-10-CM

## 2022-10-01 MED ORDER — ESZOPICLONE 2 MG PO TABS
2.0000 mg | ORAL_TABLET | Freq: Every evening | ORAL | 1 refills | Status: DC | PRN
Start: 2022-10-01 — End: 2023-01-07

## 2022-10-01 NOTE — Progress Notes (Signed)
Tonya Watkins [id number] Jan 27, 1971 52 y.o.  Virtual Visit via Video Note  I connected with pt @ on 10/01/22 at  5:00 PM EDT by a video enabled telemedicine application and verified that I am speaking with the correct person using two identifiers.   I discussed the limitations of evaluation and management by telemedicine and the availability of in person appointments. The patient expressed understanding and agreed to proceed.  I discussed the assessment and treatment plan with the patient. The patient was provided an opportunity to ask questions and all were answered. The patient agreed with the plan and demonstrated an understanding of the instructions.   The patient was advised to call back or seek an in-person evaluation if the symptoms worsen or if the condition fails to improve as anticipated.  I provided 20 minutes of non-face-to-face time during this encounter.  The patient was located at home.  The provider was located at [facility].   Tonya Gibbs, NP   Subjective:   Patient ID:  Tonya Watkins is a 52 y.o. (DOB 01-25-1971) female.  Chief Complaint: No chief complaint on file.   HPI Tonya Watkins presents for follow-up of MDD, psychosis and insomnia.  Describes mood today as "not good". Pleasant. Tearful at times. Mood symptoms - denies depression and irritability. Feels anxious "sometimes". Denies worry, rumination, and over thinking. Denies obsessive thoughts and acts. Mood is consistent. Stating "my mood is fine". Reports she continues to hear the man's voice in her head all day every day - "24 hours a day". Feels like the increase in Risperdal has been helpful. Varying interest and motivation. Taking medications as prescribed. Energy levels lower. Active, does not have a regular exercise routine.  Enjoys some usual interests and activities. Divorced. Has 3 children 26, 22 and 16 at home. Mother lives with her during the week. Spending time with  family. Appetite adequate. Weight stable - 145 pounds. Sleeping difficulties. Averages 6 hours of broken sleep. Denies daytime napping.  Focus and concentration difficulties at times. Completing tasks. Managing aspects of household. 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  Reports AH - internal. Hearing a female voice. Denies VH. Denies self harm. Denies substance use.  Previous medication trials:  Wellbutrin XL 300mg  for years up until hospitalization.  Visit Diagnosis: No diagnosis found.  Past Psychiatric History: Admitted in January 2023 to [facility] for psychosis.   Review of Systems:  Review of Systems  Musculoskeletal:  Negative for gait problem.  Neurological:  Negative for tremors.  Psychiatric/Behavioral:         Please refer to HPI    Medications: I have reviewed the patient's current medications.  Current Outpatient Medications  Medication Sig Dispense Refill   eszopiclone (LUNESTA) 2 MG TABS tablet Take 1 tablet (2 mg total) by mouth at bedtime as needed for sleep. Take immediately before bedtime 30 tablet 1   benztropine (COGENTIN) 0.5 MG tablet Take 1 tablet (0.5 mg total) by mouth at bedtime as needed for tremors. 30 tablet 0   buPROPion (WELLBUTRIN XL) 300 MG 24 hr tablet Take 1 tablet (300 mg total) by mouth daily. 30 tablet 2   hydrOXYzine (ATARAX) 50 MG tablet Take 1 tablet (50 mg total) by mouth 2 (two) times daily as needed for anxiety. 60 tablet 0   Melatonin 10 MG TABS Take by mouth.     risperiDONE (RISPERDAL M-TABS) 1 MG disintegrating tablet Take 1 tablet (1 mg total) by mouth  2 (two) times daily. 180 tablet 0   risperiDONE (RISPERDAL M-TABS) 4 MG disintegrating tablet Take 1 tablet (4 mg total) by mouth 2 (two) times daily. 180 tablet 0   traZODone (DESYREL) 50 MG tablet Take 0.5-1 tablets (25-50 mg total) by mouth at bedtime as needed for sleep. 30 tablet 0   valACYclovir (VALTREX) 500 MG tablet Take 1 tablet  (500 mg total) by mouth daily. Annual appt is due must see provider for future refills 30 tablet 0   No current facility-administered medications for this visit.    Medication Side Effects: None  Allergies:  Allergies  Allergen Reactions   Mirtazapine     Weight gain    Past Medical History:  Diagnosis Date   Depression    DVT (deep venous thrombosis) (HCC)    left calf   Heart palpitations    Medical history non-contributory     Family History  Problem Relation Age of Onset   Allergies Mother    Cancer Mother        thyroid   Heart block Maternal Grandmother    Mental illness Neg Hx     Social History   Socioeconomic History   Marital status: Divorced    Spouse name: Not on file   Number of children: 3   Years of education: Not on file   Highest education level: Associate degree: occupational, Scientist, product/process development, or vocational program  Occupational History   Occupation: Charity fundraiser  Tobacco Use   Smoking status: Never   Smokeless tobacco: Never  Vaping Use   Vaping Use: Never used  Substance and Sexual Activity   Alcohol use: Not Currently   Drug use: Not Currently   Sexual activity: Not Currently  Other Topics Concern   Not on file  Social History Narrative   Not on file   Social Determinants of Health   Financial Resource Strain: Not on file  Food Insecurity: Not on file  Transportation Needs: Not on file  Physical Activity: Not on file  Stress: Not on file  Social Connections: Not on file  Intimate Partner Violence: Not on file    Past Medical History, Surgical history, Social history, and Family history were reviewed and updated as appropriate.   Please see review of systems for further details on the patient's review from today.   Objective:   Physical Exam:  There were no vitals taken for this visit.  Physical Exam Constitutional:      General: She is not in acute distress. Musculoskeletal:        General: No deformity.  Neurological:     Mental  Status: She is alert and oriented to person, place, and time.     Coordination: Coordination normal.  Psychiatric:        Attention and Perception: Attention and perception normal. She does not perceive auditory or visual hallucinations.        Mood and Affect: Mood normal. Mood is not anxious or depressed. Affect is not labile, blunt, angry or inappropriate.        Speech: Speech normal.        Behavior: Behavior normal.        Thought Content: Thought content normal. Thought content is not paranoid or delusional. Thought content does not include homicidal or suicidal ideation. Thought content does not include homicidal or suicidal plan.        Cognition and Memory: Cognition and memory normal.        Judgment: Judgment normal.  Comments: Insight intact     Lab Review:     Component Value Date/Time   NA 140 09/05/2022 1406   K 3.7 09/05/2022 1406   CL 103 09/05/2022 1406   CO2 30 09/05/2022 1406   GLUCOSE 75 09/05/2022 1406   BUN 9 09/05/2022 1406   CREATININE 0.81 09/05/2022 1406   CREATININE 0.82 12/16/2019 1614   CALCIUM 9.9 09/05/2022 1406   PROT 8.1 09/05/2022 1406   ALBUMIN 4.6 09/05/2022 1406   AST 21 09/05/2022 1406   ALT 27 09/05/2022 1406   ALKPHOS 49 09/05/2022 1406   BILITOT 0.6 09/05/2022 1406   GFRNONAA >60 06/06/2021 1150   GFRNONAA 85 12/16/2019 1614   GFRAA 98 12/16/2019 1614       Component Value Date/Time   WBC 5.8 09/05/2022 1406   RBC 3.94 09/05/2022 1406   HGB 13.2 09/05/2022 1406   HCT 38.8 09/05/2022 1406   PLT 260.0 09/05/2022 1406   MCV 98.5 09/05/2022 1406   MCH 31.3 06/06/2021 1150   MCHC 34.1 09/05/2022 1406   RDW 13.6 09/05/2022 1406   LYMPHSABS 1.3 09/05/2022 1406   MONOABS 0.4 09/05/2022 1406   EOSABS 0.4 09/05/2022 1406   BASOSABS 0.0 09/05/2022 1406    No results found for: "POCLITH", "LITHIUM"   No results found for: "PHENYTOIN", "PHENOBARB", "VALPROATE", "CBMZ"   .res Assessment: Plan:    Plan:  PDMP  reviewed  Risperidone 5mg  BID - EKG results received. Wellbutrin XL 300 mg daily Benztropine 0.5 mg at bedtime - denies akathesia or TD  Add Lunesta 2mg  at hs  Melatonin 10 mg p.o. nightly as needed - uses every night  PRN: D/C Hydroxyzine 50mg  daily as needed D/C Trazodone 50 mg nightly as needed for sleep  EKG - 04/25 Sinus Brady - HR 57  Time spent with patient was 25 minutes. Greater than 50% of face to face time with patient was spent on counseling and coordination of care.    RTC 4 weeks  Patient advised to contact office with any questions, adverse effects, or acute worsening in signs and symptoms.   Discussed potential metabolic side effects associated with atypical antipsychotics, as well as potential risk for movement side effects. Advised pt to contact office if movement side effects occur.    Diagnoses and all orders for this visit:  Insomnia, unspecified type -     eszopiclone (LUNESTA) 2 MG TABS tablet; Take 1 tablet (2 mg total) by mouth at bedtime as needed for sleep. Take immediately before bedtime  Psychosis, unspecified psychosis type (HCC)  MDD (major depressive disorder), recurrent, in partial remission (HCC)     Please see After Visit Summary for patient specific instructions.  Future Appointments  Date Time Provider Department Center  03/06/2023  1:20 PM Plotnikov, Georgina Quint, MD [facility] None    No orders of the defined types were placed in this encounter.     -------------------------------

## 2022-10-08 ENCOUNTER — Other Ambulatory Visit: Payer: Self-pay | Admitting: Adult Health

## 2022-10-08 DIAGNOSIS — F29 Unspecified psychosis not due to a substance or known physiological condition: Secondary | ICD-10-CM

## 2022-10-30 LAB — COLOGUARD

## 2022-11-05 ENCOUNTER — Other Ambulatory Visit: Payer: Self-pay | Admitting: Adult Health

## 2022-11-30 LAB — COLOGUARD: COLOGUARD: NEGATIVE

## 2022-12-12 ENCOUNTER — Other Ambulatory Visit: Payer: Self-pay | Admitting: Adult Health

## 2022-12-16 ENCOUNTER — Telehealth: Payer: Self-pay | Admitting: Adult Health

## 2022-12-16 NOTE — Telephone Encounter (Signed)
Pt is scheduled for 01/01/23

## 2022-12-16 NOTE — Telephone Encounter (Signed)
Patient reporting she hears "external voices all day, every day". They don't tell her to hurt herself, but they want to talk for her and if she is walking they try to make her fall. She is currently on 5 mg of Risperdal.

## 2022-12-16 NOTE — Telephone Encounter (Signed)
Pt called at 1:05 asking to speak to the nurse.  Would not go in to detail on the nature of the call, but said it was about concerns she had, not medicine?  No upcoming appt scheduled.

## 2022-12-17 NOTE — Telephone Encounter (Signed)
Called patient to let her know that Almira Coaster wanted to see her in the office and an appt had been scheduled, to address her concerns.

## 2023-01-01 ENCOUNTER — Encounter: Payer: Self-pay | Admitting: Adult Health

## 2023-01-01 ENCOUNTER — Ambulatory Visit: Payer: No Typology Code available for payment source | Admitting: Adult Health

## 2023-01-01 DIAGNOSIS — G4700 Insomnia, unspecified: Secondary | ICD-10-CM

## 2023-01-01 DIAGNOSIS — F29 Unspecified psychosis not due to a substance or known physiological condition: Secondary | ICD-10-CM | POA: Diagnosis not present

## 2023-01-01 DIAGNOSIS — F3341 Major depressive disorder, recurrent, in partial remission: Secondary | ICD-10-CM | POA: Diagnosis not present

## 2023-01-01 MED ORDER — ARIPIPRAZOLE 2 MG PO TABS
2.0000 mg | ORAL_TABLET | Freq: Every day | ORAL | 2 refills | Status: DC
Start: 2023-01-01 — End: 2023-03-05

## 2023-01-01 NOTE — Progress Notes (Signed)
Tonya Watkins [id number] 1970-10-12 52 y.o.  Subjective:   Patient ID:  Tonya Watkins is a 52 y.o. (DOB 1970-06-26) female.  Chief Complaint: No chief complaint on file.   HPI Tonya Watkins presents to the office today for follow-up of MDD, psychosis and insomnia.  Describes mood today as "not good". Pleasant. Tearful at times. Mood symptoms - denies depression and irritability. Feels anxious "at times". Denies panic attacks. Reports some worry, rumination, and over thinking. Denies obsessive thoughts and acts. Mood is consistent. Stating "I feel like I'm doing ok outside of hearing the voice all the time". Has been taking the Risperdal Feels like the increase in Risperdal has been helpful. Varying interest and motivation. Taking medications as prescribed. Energy levels lower. Active, does not have a regular exercise routine.  Enjoys some usual interests and activities. Divorced. Has 3 children 26, 22 and 16 at home. Mother lives with her during the week. Spending time with family. Appetite adequate. Weight stable - 145 pounds. Sleep has improved. Averages 7 to 8 hours. Denies daytime napping.  Reports focus and concentration difficulties. Completing tasks. Managing aspects of household. 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 Reports AH - internal. Hearing a female voice. Denies VH. Denies self harm. Denies substance use.  Previous medication trials:  Wellbutrin XL 300mg  for years up until hospitalization.  Visit Diagnosis: No diagnosis found.  Past Psychiatric History: Admitted in January 2023 to [facility] for psychosis.   GAD-7    Flowsheet Row Office Visit from 03/24/2017 in [facility]  Total GAD-7 Score 2      PHQ2-9    Flowsheet Row Office Visit from 09/05/2022 in [facility] Office Visit from 06/20/2021 in [facility] Office Visit from  12/05/2020 in [facility] Office Visit from 12/16/2019 in [facility] [place] Office Visit from 08/15/2017 in Primary Care at [facility] Total Score 0 1 0 0 0  PHQ-9 Total Score -- -- 0 0 --      Flowsheet Row ED from 06/06/2021 in [facility] Emergency Department at [facility] ED from 06/01/2021 in [facility] Emergency Department at [facility]  C-SSRS RISK CATEGORY No Risk No Risk        Review of Systems:  Review of Systems  Musculoskeletal:  Negative for gait problem.  Neurological:  Negative for tremors.  Psychiatric/Behavioral:         Please refer to HPI    Medications: I have reviewed the patient's current medications.  Current Outpatient Medications  Medication Sig Dispense Refill   benztropine (COGENTIN) 0.5 MG tablet TAKE 1 TABLET (0.5 MG TOTAL) BY MOUTH AT BEDTIME AS NEEDED FOR TREMORS. 90 tablet 1   buPROPion (WELLBUTRIN XL) 300 MG 24 hr tablet Take 1 tablet (300 mg total) by mouth daily. 30 tablet 2   eszopiclone (LUNESTA) 2 MG TABS tablet Take 1 tablet (2 mg total) by mouth at bedtime as needed for sleep. Take immediately before bedtime 30 tablet 1   hydrOXYzine (ATARAX) 50 MG tablet Take 1 tablet (50 mg total) by mouth 2 (two) times daily as needed for anxiety. 60 tablet 0   Melatonin 10 MG TABS Take by mouth.     risperiDONE (RISPERDAL M-TABS) 1 MG disintegrating tablet Take 1 tablet (1 mg total) by mouth 2 (two) times daily. 180 tablet 0  risperiDONE (RISPERDAL M-TABS) 4 MG disintegrating tablet DISSOLVE 1 TABLET BY MOUTH TWICE A DAY 180 tablet 0   traZODone (DESYREL) 50 MG tablet Take 0.5-1 tablets (25-50 mg total) by mouth at bedtime as needed for sleep. 30 tablet 0   valACYclovir (VALTREX) 500 MG tablet Take 1 tablet (500 mg total) by mouth daily. Annual appt is due must see provider for future refills 30 tablet 0   No current facility-administered medications for this visit.     Medication Side Effects: None  Allergies:  Allergies  Allergen Reactions   Mirtazapine     Weight gain    Past Medical History:  Diagnosis Date   Depression    DVT (deep venous thrombosis) (HCC)    left calf   Heart palpitations    Medical history non-contributory     Past Medical History, Surgical history, Social history, and Family history were reviewed and updated as appropriate.   Please see review of systems for further details on the patient's review from today.   Objective:   Physical Exam:  There were no vitals taken for this visit.  Physical Exam Constitutional:      General: She is not in acute distress. Musculoskeletal:        General: No deformity.  Neurological:     Mental Status: She is alert and oriented to person, place, and time.     Coordination: Coordination normal.  Psychiatric:        Attention and Perception: Attention and perception normal. She does not perceive auditory or visual hallucinations.        Mood and Affect: Affect is not labile, blunt, angry or inappropriate.        Speech: Speech normal.        Behavior: Behavior normal.        Thought Content: Thought content normal. Thought content is not paranoid or delusional. Thought content does not include homicidal or suicidal ideation. Thought content does not include homicidal or suicidal plan.        Cognition and Memory: Cognition and memory normal.        Judgment: Judgment normal.     Comments: Insight intact     Lab Review:     Component Value Date/Time   NA 140 09/05/2022 1406   K 3.7 09/05/2022 1406   CL 103 09/05/2022 1406   CO2 30 09/05/2022 1406   GLUCOSE 75 09/05/2022 1406   BUN 9 09/05/2022 1406   CREATININE 0.81 09/05/2022 1406   CREATININE 0.82 12/16/2019 1614   CALCIUM 9.9 09/05/2022 1406   PROT 8.1 09/05/2022 1406   ALBUMIN 4.6 09/05/2022 1406   AST 21 09/05/2022 1406   ALT 27 09/05/2022 1406   ALKPHOS 49 09/05/2022 1406   BILITOT 0.6 09/05/2022 1406    GFRNONAA >60 06/06/2021 1150   GFRNONAA 85 12/16/2019 1614   GFRAA 98 12/16/2019 1614       Component Value Date/Time   WBC 5.8 09/05/2022 1406   RBC 3.94 09/05/2022 1406   HGB 13.2 09/05/2022 1406   HCT 38.8 09/05/2022 1406   PLT 260.0 09/05/2022 1406   MCV 98.5 09/05/2022 1406   MCH 31.3 06/06/2021 1150   MCHC 34.1 09/05/2022 1406   RDW 13.6 09/05/2022 1406   LYMPHSABS 1.3 09/05/2022 1406   MONOABS 0.4 09/05/2022 1406   EOSABS 0.4 09/05/2022 1406   BASOSABS 0.0 09/05/2022 1406    No results found for: "POCLITH", "LITHIUM"   No results found for: "PHENYTOIN", "PHENOBARB", "VALPROATE", "  CBMZ"   .res Assessment: Plan:    Plan:  PDMP reviewed  Add Abilify 2mg  daily for psychosis  Risperidone 5mg  BID to 4mg  BID - EKG results received. Wellbutrin XL 300 mg daily Benztropine 0.5 mg at bedtime - denies akathesia or TD  Add Lunesta 2mg  at hs  Melatonin 10 mg p.o. nightly as needed - uses every night  PRN: D/C Hydroxyzine 50mg  daily as needed D/C Trazodone 50 mg nightly as needed for sleep  EKG - 04/25 Sinus Brady - HR 57  Time spent with patient was 25 minutes. Greater than 50% of face to face time with patient was spent on counseling and coordination of care.    RTC 4 weeks  Patient advised to contact office with any questions, adverse effects, or acute worsening in signs and symptoms.   Discussed potential metabolic side effects associated with atypical antipsychotics, as well as potential risk for movement side effects. Advised pt to contact office if movement side effects occur.    There are no diagnoses linked to this encounter.   Please see After Visit Summary for patient specific instructions.  Future Appointments  Date Time Provider Department Center  01/01/2023  5:00 PM Tonya Watkins, Tonya Solo, NP [facility] None  03/06/2023  1:20 PM Plotnikov, Georgina Quint, MD [facility] None    No orders of the defined types were placed in this  encounter.   -------------------------------

## 2023-01-06 ENCOUNTER — Other Ambulatory Visit: Payer: Self-pay | Admitting: Adult Health

## 2023-01-06 DIAGNOSIS — G4700 Insomnia, unspecified: Secondary | ICD-10-CM

## 2023-01-07 ENCOUNTER — Other Ambulatory Visit: Payer: Self-pay

## 2023-01-07 NOTE — Telephone Encounter (Signed)
Marilynn called at 3:15 to check status of refill of her Lunesta.  Appt 9/18/2[id number] [address], [place] [phone].  Send to

## 2023-01-07 NOTE — Telephone Encounter (Signed)
Rx responded to via a pharmacy RF request.

## 2023-01-09 ENCOUNTER — Other Ambulatory Visit: Payer: Self-pay | Admitting: Adult Health

## 2023-01-19 ENCOUNTER — Other Ambulatory Visit: Payer: Self-pay | Admitting: Adult Health

## 2023-01-19 DIAGNOSIS — F29 Unspecified psychosis not due to a substance or known physiological condition: Secondary | ICD-10-CM

## 2023-01-24 ENCOUNTER — Other Ambulatory Visit: Payer: Self-pay | Admitting: Adult Health

## 2023-01-24 DIAGNOSIS — F29 Unspecified psychosis not due to a substance or known physiological condition: Secondary | ICD-10-CM

## 2023-01-29 ENCOUNTER — Encounter: Payer: Self-pay | Admitting: Adult Health

## 2023-01-29 ENCOUNTER — Telehealth: Payer: No Typology Code available for payment source | Admitting: Adult Health

## 2023-01-29 DIAGNOSIS — F3341 Major depressive disorder, recurrent, in partial remission: Secondary | ICD-10-CM | POA: Diagnosis not present

## 2023-01-29 DIAGNOSIS — G4700 Insomnia, unspecified: Secondary | ICD-10-CM | POA: Diagnosis not present

## 2023-01-29 DIAGNOSIS — F29 Unspecified psychosis not due to a substance or known physiological condition: Secondary | ICD-10-CM | POA: Diagnosis not present

## 2023-01-29 MED ORDER — ARIPIPRAZOLE 2 MG PO TABS
2.0000 mg | ORAL_TABLET | Freq: Two times a day (BID) | ORAL | 2 refills | Status: DC
Start: 2023-01-29 — End: 2023-03-05

## 2023-01-29 MED ORDER — RISPERIDONE 3 MG PO TABS
3.0000 mg | ORAL_TABLET | Freq: Two times a day (BID) | ORAL | 2 refills | Status: DC
Start: 2023-01-29 — End: 2023-02-24

## 2023-01-29 NOTE — Progress Notes (Signed)
Tonya Watkins [id number] [birth year] 52 y.o.  Virtual Visit via Video Note  I connected with pt @ on 01/29/23 at  5:00 PM EDT by a video enabled telemedicine application and verified that I am speaking with the correct person using two identifiers.   I discussed the limitations of evaluation and management by telemedicine and the availability of in person appointments. The patient expressed understanding and agreed to proceed.  I discussed the assessment and treatment plan with the patient. The patient was provided an opportunity to ask questions and all were answered. The patient agreed with the plan and demonstrated an understanding of the instructions.   The patient was advised to call back or seek an in-person evaluation if the symptoms worsen or if the condition fails to improve as anticipated.  I provided 15 minutes of non-face-to-face time during this encounter. The patient was located at home.  The provider was located at [facility].   Dorothyann Gibbs, NP   Subjective:   Patient ID:  Tonya Watkins is a 51 y.o. (DOB October 26, 1970) female.  Chief Complaint: No chief complaint on file.   HPI Raiah Schlafer presents for follow-up of MDD, psychosis and insomnia.  Describes mood today as "not good". Pleasant. Tearful at times. Mood symptoms - reports depression - "not all the time". Denies irritability. Feels anxious "sometimes". Denies panic attacks. Reports some worry, rumination, and over thinking. Denies obsessive thoughts and acts. Mood is consistent. Stating "I feel like I'm doing a little bit better". Feels like the addition of Abilify has been helpful. Has reduced the dose of Risperdal. Varying interest and motivation. Taking medications as prescribed. Energy levels stable. Active, does not have a regular exercise routine.  Enjoys some usual interests and activities. Divorced. Has 3 children 26, 22 and 16 at home. Mother lives with her during the week. Spending time  with family. Appetite adequate. Weight stable - 145 pounds. Sleep has improved. Averages 6 to 8 hours. Denies daytime napping.  Reports focus and concentration difficulties. Completing tasks. Managing aspects of household. 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 Reports AH - internal. Hearing a female voice - "not as bad". Denies VH. Denies self harm. Denies substance use.  Previous medication trials:  Wellbutrin XL 300mg  for years up until hospitalization.  Visit Diagnosis: No diagnosis found.  Past Psychiatric History: Admitted in January 2023 to [facility] for psychosis.   Review of Systems:  Review of Systems  Musculoskeletal:  Negative for gait problem.  Neurological:  Negative for tremors.  Psychiatric/Behavioral:         Please refer to HPI    Medications: I have reviewed the patient's current medications.  Current Outpatient Medications  Medication Sig Dispense Refill   ARIPiprazole (ABILIFY) 2 MG tablet Take 1 tablet (2 mg total) by mouth 2 (two) times daily. 60 tablet 2   risperiDONE (RISPERDAL) 3 MG tablet Take 1 tablet (3 mg total) by mouth 2 (two) times daily. 60 tablet 2   ARIPiprazole (ABILIFY) 2 MG tablet Take 1 tablet (2 mg total) by mouth daily. 30 tablet 2   benztropine (COGENTIN) 0.5 MG tablet TAKE 1 TABLET (0.5 MG TOTAL) BY MOUTH AT BEDTIME AS NEEDED FOR TREMORS. 90 tablet 1   buPROPion (WELLBUTRIN XL) 300 MG 24 hr tablet TAKE 1 TABLET BY MOUTH EVERY DAY 30 tablet 0   eszopiclone (LUNESTA) 2 MG TABS tablet TAKE 1 TABLET (2 MG TOTAL) BY MOUTH IMMEDIATELY BEFORE BEDTIME AS NEEDED  FOR SLEEP 15 tablet 3   Melatonin 10 MG TABS Take by mouth.     risperiDONE (RISPERDAL M-TABS) 4 MG disintegrating tablet DISSOLVE 1 TABLET BY MOUTH TWICE A DAY 180 tablet 0   valACYclovir (VALTREX) 500 MG tablet Take 1 tablet (500 mg total) by mouth daily. Annual appt is due must see provider for future refills 30 tablet 0   No current  facility-administered medications for this visit.    Medication Side Effects: None  Allergies:  Allergies  Allergen Reactions   Mirtazapine     Weight gain    Past Medical History:  Diagnosis Date   Depression    DVT (deep venous thrombosis) (HCC)    left calf   Heart palpitations    Medical history non-contributory     Family History  Problem Relation Age of Onset   Allergies Mother    Cancer Mother        thyroid   Heart block Maternal Grandmother    Mental illness Neg Hx     Social History   Socioeconomic History   Marital status: Divorced    Spouse name: Not on file   Number of children: 3   Years of education: Not on file   Highest education level: Associate degree: occupational, Scientist, product/process development, or vocational program  Occupational History   Occupation: Charity fundraiser  Tobacco Use   Smoking status: Never   Smokeless tobacco: Never  Vaping Use   Vaping status: Never Used  Substance and Sexual Activity   Alcohol use: Not Currently   Drug use: Not Currently   Sexual activity: Not Currently  Other Topics Concern   Not on file  Social History Narrative   Not on file   Social Determinants of Health   Financial Resource Strain: Not on file  Food Insecurity: Not on file  Transportation Needs: Not on file  Physical Activity: Not on file  Stress: Not on file  Social Connections: Not on file  Intimate Partner Violence: Not on file    Past Medical History, Surgical history, Social history, and Family history were reviewed and updated as appropriate.   Please see review of systems for further details on the patient's review from today.   Objective:   Physical Exam:  There were no vitals taken for this visit.  Physical Exam  Lab Review:     Component Value Date/Time   NA 140 09/05/2022 1406   K 3.7 09/05/2022 1406   CL 103 09/05/2022 1406   CO2 30 09/05/2022 1406   GLUCOSE 75 09/05/2022 1406   BUN 9 09/05/2022 1406   CREATININE 0.81 09/05/2022 1406    CREATININE 0.82 12/16/2019 1614   CALCIUM 9.9 09/05/2022 1406   PROT 8.1 09/05/2022 1406   ALBUMIN 4.6 09/05/2022 1406   AST 21 09/05/2022 1406   ALT 27 09/05/2022 1406   ALKPHOS 49 09/05/2022 1406   BILITOT 0.6 09/05/2022 1406   GFRNONAA >60 06/06/2021 1150   GFRNONAA 85 12/16/2019 1614   GFRAA 98 12/16/2019 1614       Component Value Date/Time   WBC 5.8 09/05/2022 1406   RBC 3.94 09/05/2022 1406   HGB 13.2 09/05/2022 1406   HCT 38.8 09/05/2022 1406   PLT 260.0 09/05/2022 1406   MCV 98.5 09/05/2022 1406   MCH 31.3 06/06/2021 1150   MCHC 34.1 09/05/2022 1406   RDW 13.6 09/05/2022 1406   LYMPHSABS 1.3 09/05/2022 1406   MONOABS 0.4 09/05/2022 1406   EOSABS 0.4 09/05/2022 1406  BASOSABS 0.0 09/05/2022 1406    No results found for: "POCLITH", "LITHIUM"   No results found for: "PHENYTOIN", "PHENOBARB", "VALPROATE", "CBMZ"   .res Assessment: Plan:    Plan:  PDMP reviewed  Increase Abilify 2mg  to 4mg  daily for psychosis - reports helpful. Decrease Risperidone 4mg  BID to 4mg  and 3mg  x 7 days, then decrease to 3mg  BID - will plan to taper off with lack of effectiveness.  Wellbutrin XL 300 mg daily Benztropine 0.5 mg at bedtime - denies akathesia or TD  Lunesta 2mg  at hs  Melatonin 10 mg p.o. nightly as needed - uses every night  EKG - 04/25 Sinus Brady - HR 57  Time spent with patient was 25 minutes. Greater than 50% of face to face time with patient was spent on counseling and coordination of care.    RTC 4 weeks  Patient advised to contact office with any questions, adverse effects, or acute worsening in signs and symptoms.   Discussed potential metabolic side effects associated with atypical antipsychotics, as well as potential risk for movement side effects. Advised pt to contact office if movement side effects occur.    Diagnoses and all orders for this visit:  Psychosis, unspecified psychosis type (HCC) -     risperiDONE (RISPERDAL) 3 MG tablet; Take 1  tablet (3 mg total) by mouth 2 (two) times daily. -     ARIPiprazole (ABILIFY) 2 MG tablet; Take 1 tablet (2 mg total) by mouth 2 (two) times daily.  Insomnia, unspecified type  MDD (major depressive disorder), recurrent, in partial remission (HCC)     Please see After Visit Summary for patient specific instructions.  Future Appointments  Date Time Provider Department Center  03/06/2023  1:20 PM Plotnikov, Georgina Quint, MD [facility] None    No orders of the defined types were placed in this encounter.     -------------------------------

## 2023-02-21 ENCOUNTER — Other Ambulatory Visit: Payer: Self-pay | Admitting: Adult Health

## 2023-02-21 DIAGNOSIS — F29 Unspecified psychosis not due to a substance or known physiological condition: Secondary | ICD-10-CM

## 2023-02-23 NOTE — Telephone Encounter (Signed)
Has she tapered off per last visit?  Decrease Risperidone 4mg  BID to 4mg  and 3mg  x 7 days, then decrease to 3mg  BID - will plan to taper off with lack of effectiveness.

## 2023-03-05 ENCOUNTER — Encounter: Payer: Self-pay | Admitting: Adult Health

## 2023-03-05 ENCOUNTER — Telehealth: Payer: No Typology Code available for payment source | Admitting: Adult Health

## 2023-03-05 DIAGNOSIS — F339 Major depressive disorder, recurrent, unspecified: Secondary | ICD-10-CM | POA: Diagnosis not present

## 2023-03-05 DIAGNOSIS — F29 Unspecified psychosis not due to a substance or known physiological condition: Secondary | ICD-10-CM | POA: Diagnosis not present

## 2023-03-05 DIAGNOSIS — F3341 Major depressive disorder, recurrent, in partial remission: Secondary | ICD-10-CM

## 2023-03-05 DIAGNOSIS — G4700 Insomnia, unspecified: Secondary | ICD-10-CM | POA: Diagnosis not present

## 2023-03-05 MED ORDER — ARIPIPRAZOLE 5 MG PO TABS
5.0000 mg | ORAL_TABLET | Freq: Every day | ORAL | 2 refills | Status: DC
Start: 2023-03-05 — End: 2023-04-08

## 2023-03-05 MED ORDER — BUPROPION HCL ER (XL) 300 MG PO TB24
300.0000 mg | ORAL_TABLET | Freq: Every day | ORAL | 2 refills | Status: DC
Start: 2023-03-05 — End: 2023-05-21

## 2023-03-05 MED ORDER — BENZTROPINE MESYLATE 0.5 MG PO TABS: 0.5000 mg | ORAL_TABLET | Freq: Every evening | ORAL | 1 refills | Status: DC | PRN

## 2023-03-05 MED ORDER — ESZOPICLONE 2 MG PO TABS
2.0000 mg | ORAL_TABLET | Freq: Every day | ORAL | 2 refills | Status: DC
Start: 2023-03-05 — End: 2023-05-21

## 2023-03-05 NOTE — Progress Notes (Signed)
Tonya Watkins [id number] 1970/05/26 52 y.o.  Virtual Visit via Telephone Note  I connected with pt on 03/05/23 at  5:00 PM EDT by telephone and verified that I am speaking with the correct person using two identifiers.   I discussed the limitations, risks, security and privacy concerns of performing an evaluation and management service by telephone and the availability of in person appointments. I also discussed with the patient that there may be a patient responsible charge related to this service. The patient expressed understanding and agreed to proceed.   I discussed the assessment and treatment plan with the patient. The patient was provided an opportunity to ask questions and all were answered. The patient agreed with the plan and demonstrated an understanding of the instructions.   The patient was advised to call back or seek an in-person evaluation if the symptoms worsen or if the condition fails to improve as anticipated.  I provided 15 minutes of non-face-to-face time during this encounter.  The patient was located at home.  The provider was located at [facility].   Tonya Gibbs, NP   Subjective:   Patient ID:  Tonya Watkins is a 52 y.o. (DOB 04-09-1971) female.  Chief Complaint: No chief complaint on file.   HPI Tonya Watkins presents for follow-up of MDD, psychosis and insomnia.  Describes mood today as "ok". Pleasant. Tearful at times. Mood symptoms - reports decreased depression - "just a little bit". Denies irritability. Reports feeling anxious "at times". Denies panic attacks. Denies worry, rumination, and over thinking. Denies obsessive thoughts and acts. Mood is stable - "for the most part". Stating "I feel about the same as last visit". Reports medications are helpful, but is still hearing a man's voice. Continues to hear a man's voice - negative things. Stating "he just talks all day". Varying interest and motivation. Taking medications as  prescribed. Energy levels stable. Active, does not have a regular exercise routine.  Enjoys some usual interests and activities. Divorced. Has 3 children 26, 22 and 16 at home. Mother lives with her during the week. Spending time with family. Appetite adequate. Weight stable - 145 pounds. Sleep has improved. Averages 6 to 8 hours. Denies daytime napping.  Reports focus and concentration difficulties. Completing tasks. Managing aspects of household. 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 Reports AH - internal. Hearing a female voice - "not as bad". Denies VH. Denies self harm. Denies substance use.  Previous medication trials:  Wellbutrin XL 300mg  for years up until hospitalization.  Visit Diagnosis: No diagnosis found.  Past Psychiatric History: Admitted in January 2023 to [facility] for psychosis.    Review of Systems:  Review of Systems  Musculoskeletal:  Negative for gait problem.  Neurological:  Negative for tremors.  Psychiatric/Behavioral:         Please refer to HPI    Medications: I have reviewed the patient's current medications.  Current Outpatient Medications  Medication Sig Dispense Refill   ARIPiprazole (ABILIFY) 2 MG tablet Take 1 tablet (2 mg total) by mouth daily. 30 tablet 2   ARIPiprazole (ABILIFY) 2 MG tablet Take 1 tablet (2 mg total) by mouth 2 (two) times daily. 60 tablet 2   benztropine (COGENTIN) 0.5 MG tablet TAKE 1 TABLET (0.5 MG TOTAL) BY MOUTH AT BEDTIME AS NEEDED FOR TREMORS. 90 tablet 1   buPROPion (WELLBUTRIN XL) 300 MG 24 hr tablet TAKE 1 TABLET BY MOUTH EVERY DAY 30 tablet 0   eszopiclone (  LUNESTA) 2 MG TABS tablet TAKE 1 TABLET (2 MG TOTAL) BY MOUTH IMMEDIATELY BEFORE BEDTIME AS NEEDED FOR SLEEP 15 tablet 3   Melatonin 10 MG TABS Take by mouth.     risperiDONE (RISPERDAL M-TABS) 4 MG disintegrating tablet DISSOLVE 1 TABLET BY MOUTH TWICE A DAY 180 tablet 0   risperiDONE (RISPERDAL) 3 MG tablet TAKE 1  TABLET (3 MG TOTAL) BY MOUTH TWICE A DAY 180 tablet 0   valACYclovir (VALTREX) 500 MG tablet Take 1 tablet (500 mg total) by mouth daily. Annual appt is due must see provider for future refills 30 tablet 0   No current facility-administered medications for this visit.    Medication Side Effects: None  Allergies:  Allergies  Allergen Reactions   Mirtazapine     Weight gain    Past Medical History:  Diagnosis Date   Depression    DVT (deep venous thrombosis) (HCC)    left calf   Heart palpitations    Medical history non-contributory     Family History  Problem Relation Age of Onset   Allergies Mother    Cancer Mother        thyroid   Heart block Maternal Grandmother    Mental illness Neg Hx     Social History   Socioeconomic History   Marital status: Divorced    Spouse name: Not on file   Number of children: 3   Years of education: Not on file   Highest education level: Associate degree: occupational, Scientist, product/process development, or vocational program  Occupational History   Occupation: Charity fundraiser  Tobacco Use   Smoking status: Never   Smokeless tobacco: Never  Vaping Use   Vaping status: Never Used  Substance and Sexual Activity   Alcohol use: Not Currently   Drug use: Not Currently   Sexual activity: Not Currently  Other Topics Concern   Not on file  Social History Narrative   Not on file   Social Determinants of Health   Financial Resource Strain: Not on file  Food Insecurity: Not on file  Transportation Needs: Not on file  Physical Activity: Not on file  Stress: Not on file  Social Connections: Not on file  Intimate Partner Violence: Not on file    Past Medical History, Surgical history, Social history, and Family history were reviewed and updated as appropriate.   Please see review of systems for further details on the patient's review from today.   Objective:   Physical Exam:  There were no vitals taken for this visit.  Physical Exam Constitutional:       General: She is not in acute distress. Musculoskeletal:        General: No deformity.  Neurological:     Mental Status: She is alert and oriented to person, place, and time.     Coordination: Coordination normal.  Psychiatric:        Attention and Perception: Attention and perception normal. She does not perceive auditory or visual hallucinations.        Mood and Affect: Mood normal. Affect is not labile, blunt, angry or inappropriate.        Speech: Speech normal.        Behavior: Behavior normal.        Thought Content: Thought content normal. Thought content is not paranoid or delusional. Thought content does not include homicidal or suicidal ideation. Thought content does not include homicidal or suicidal plan.        Cognition and Memory: Cognition and  memory normal.        Judgment: Judgment normal.     Comments: Insight intact     Lab Review:     Component Value Date/Time   NA 140 09/05/2022 1406   K 3.7 09/05/2022 1406   CL 103 09/05/2022 1406   CO2 30 09/05/2022 1406   GLUCOSE 75 09/05/2022 1406   BUN 9 09/05/2022 1406   CREATININE 0.81 09/05/2022 1406   CREATININE 0.82 12/16/2019 1614   CALCIUM 9.9 09/05/2022 1406   PROT 8.1 09/05/2022 1406   ALBUMIN 4.6 09/05/2022 1406   AST 21 09/05/2022 1406   ALT 27 09/05/2022 1406   ALKPHOS 49 09/05/2022 1406   BILITOT 0.6 09/05/2022 1406   GFRNONAA >60 06/06/2021 1150   GFRNONAA 85 12/16/2019 1614   GFRAA 98 12/16/2019 1614       Component Value Date/Time   WBC 5.8 09/05/2022 1406   RBC 3.94 09/05/2022 1406   HGB 13.2 09/05/2022 1406   HCT 38.8 09/05/2022 1406   PLT 260.0 09/05/2022 1406   MCV 98.5 09/05/2022 1406   MCH 31.3 06/06/2021 1150   MCHC 34.1 09/05/2022 1406   RDW 13.6 09/05/2022 1406   LYMPHSABS 1.3 09/05/2022 1406   MONOABS 0.4 09/05/2022 1406   EOSABS 0.4 09/05/2022 1406   BASOSABS 0.0 09/05/2022 1406    No results found for: "POCLITH", "LITHIUM"   No results found for: "PHENYTOIN", "PHENOBARB",  "VALPROATE", "CBMZ"   .res Assessment: Plan:    Plan:  PDMP reviewed  Abilify 4mg  to 5mg  daily for psychosis. Risperidone 3mg  BID - will plan to taper off with lack of effectiveness.  Wellbutrin XL 300 mg daily Benztropine 0.5 mg at bedtime - denies akathesia or TD  Lunesta 2mg  at hs  Melatonin 10 mg p.o. nightly as needed - uses every night  EKG - 04/25 Sinus Brady - HR 57  Time spent with patient was 25 minutes. Greater than 50% of face to face time with patient was spent on counseling and coordination of care.    RTC 4 weeks  Patient advised to contact office with any questions, adverse effects, or acute worsening in signs and symptoms.   Discussed potential metabolic side effects associated with atypical antipsychotics, as well as potential risk for movement side effects. Advised pt to contact office if movement side effects occur.    There are no diagnoses linked to this encounter.  Please see After Visit Summary for patient specific instructions.  Future Appointments  Date Time Provider Department Center  03/05/2023  5:00 PM Lauri Purdum, Thereasa Solo, NP [facility] None  03/11/2023  1:20 PM Plotnikov, Georgina Quint, MD [facility] None    No orders of the defined types were placed in this encounter.     -------------------------------

## 2023-03-06 ENCOUNTER — Ambulatory Visit: Payer: No Typology Code available for payment source | Admitting: Internal Medicine

## 2023-03-11 ENCOUNTER — Encounter: Payer: Self-pay | Admitting: Internal Medicine

## 2023-03-11 ENCOUNTER — Ambulatory Visit (INDEPENDENT_AMBULATORY_CARE_PROVIDER_SITE_OTHER): Payer: No Typology Code available for payment source | Admitting: Internal Medicine

## 2023-03-11 VITALS — BP 110/70 | HR 61 | Temp 98.1°F | Ht 63.0 in | Wt 150.0 lb

## 2023-03-11 DIAGNOSIS — F32A Depression, unspecified: Secondary | ICD-10-CM

## 2023-03-11 DIAGNOSIS — Z23 Encounter for immunization: Secondary | ICD-10-CM

## 2023-03-11 DIAGNOSIS — R5382 Chronic fatigue, unspecified: Secondary | ICD-10-CM

## 2023-03-11 DIAGNOSIS — G4709 Other insomnia: Secondary | ICD-10-CM | POA: Diagnosis not present

## 2023-03-11 LAB — CBC WITH DIFFERENTIAL/PLATELET
Basophils Absolute: 0 10*3/uL (ref 0.0–0.1)
Basophils Relative: 0.4 % (ref 0.0–3.0)
Eosinophils Absolute: 0.1 10*3/uL (ref 0.0–0.7)
Eosinophils Relative: 2 % (ref 0.0–5.0)
HCT: 39.2 % (ref 36.0–46.0)
Hemoglobin: 12.8 g/dL (ref 12.0–15.0)
Lymphocytes Relative: 26.3 % (ref 12.0–46.0)
Lymphs Abs: 1.3 10*3/uL (ref 0.7–4.0)
MCHC: 32.7 g/dL (ref 30.0–36.0)
MCV: 97.6 fL (ref 78.0–100.0)
Monocytes Absolute: 0.4 10*3/uL (ref 0.1–1.0)
Monocytes Relative: 8.8 % (ref 3.0–12.0)
Neutro Abs: 3.1 10*3/uL (ref 1.4–7.7)
Neutrophils Relative %: 62.5 % (ref 43.0–77.0)
Platelets: 257 10*3/uL (ref 150.0–400.0)
RBC: 4.02 Mil/uL (ref 3.87–5.11)
RDW: 12.2 % (ref 11.5–15.5)
WBC: 4.9 10*3/uL (ref 4.0–10.5)

## 2023-03-11 LAB — COMPREHENSIVE METABOLIC PANEL
ALT: 35 U/L (ref 0–35)
AST: 24 U/L (ref 0–37)
Albumin: 4.6 g/dL (ref 3.5–5.2)
Alkaline Phosphatase: 47 U/L (ref 39–117)
BUN: 10 mg/dL (ref 6–23)
CO2: 31 meq/L (ref 19–32)
Calcium: 10 mg/dL (ref 8.4–10.5)
Chloride: 103 meq/L (ref 96–112)
Creatinine, Ser: 0.74 mg/dL (ref 0.40–1.20)
GFR: 93.73 mL/min (ref >60.00)
Glucose, Bld: 91 mg/dL (ref 70–99)
Potassium: 3.6 meq/L (ref 3.5–5.1)
Sodium: 140 meq/L (ref 135–145)
Total Bilirubin: 0.5 mg/dL (ref 0.2–1.2)
Total Protein: 7.7 g/dL (ref 6.0–8.3)

## 2023-03-11 LAB — T4, FREE: Free T4: 1.02 ng/dL (ref 0.60–1.60)

## 2023-03-11 LAB — TSH: TSH: 1.16 u[IU]/mL (ref 0.35–5.50)

## 2023-03-11 NOTE — Addendum Note (Signed)
Addended by: Delsa Grana R on: 03/11/2023 02:19 PM   Modules accepted: Orders

## 2023-03-11 NOTE — Assessment & Plan Note (Signed)
Better.Fatigue is likely due to psychiatric meds Keep ROV with Ms Mozingo Obtain labs - CBC, CMET, TSH, UA

## 2023-03-11 NOTE — Assessment & Plan Note (Signed)
On Lunesta prn

## 2023-03-11 NOTE — Progress Notes (Signed)
Subjective:  Patient ID: Tonya Watkins, female    DOB: 1970/07/09  Age: 52 y.o. MRN: [id number]  CC: Medical Management of Chronic Issues (6 MNTH F/U, Pt has concerns about extreme fatigue)   HPI Tonya Watkins presents for fatigue - very tired all day every day, sleeping more on the weekend  [facility] Lunesta at [facility]  Working 1st shift from home   Outpatient Medications Prior to Visit  Medication Sig Dispense Refill   ARIPiprazole (ABILIFY) 5 MG tablet Take 1 tablet (5 mg total) by mouth daily. 30 tablet 2   benztropine (COGENTIN) 0.5 MG tablet Take 1 tablet (0.5 mg total) by mouth at bedtime as needed for tremors. 90 tablet 1   buPROPion (WELLBUTRIN XL) 300 MG 24 hr tablet Take 1 tablet (300 mg total) by mouth daily. 30 tablet 2   eszopiclone (LUNESTA) 2 MG TABS tablet Take 1 tablet (2 mg total) by mouth at bedtime. Take immediately before bedtime 30 tablet 2   Melatonin 10 MG TABS Take by mouth.     risperiDONE (RISPERDAL) 3 MG tablet TAKE 1 TABLET (3 MG TOTAL) BY MOUTH TWICE A DAY 180 tablet 0   valACYclovir (VALTREX) 500 MG tablet Take 1 tablet (500 mg total) by mouth daily. Annual appt is due must see provider for future refills 30 tablet 0   No facility-administered medications prior to visit.    ROS: Review of Systems  Constitutional:  Positive for fatigue. Negative for activity change, appetite change, chills and unexpected weight change.  HENT:  Negative for congestion, mouth sores and sinus pressure.   Eyes:  Negative for visual disturbance.  Respiratory:  Negative for cough and chest tightness.   Gastrointestinal:  Negative for abdominal pain and nausea.  Genitourinary:  Negative for difficulty urinating, frequency and vaginal pain.  Musculoskeletal:  Negative for back pain and gait problem.  Skin:  Negative for pallor and rash.  Neurological:  Negative for dizziness, tremors, weakness, numbness and headaches.  Psychiatric/Behavioral:  Positive for sleep disturbance.  Negative for confusion and suicidal ideas.     Objective:  BP 110/70 (BP Location: Left Arm, Patient Position: Sitting, Cuff Size: Normal)   Pulse 61   Temp 98.1 F (36.7 C) (Oral)   Ht 5\' 3"  (1.6 m)   Wt 150 lb (68 kg)   SpO2 99%   BMI 26.57 kg/m   BP Readings from Last 3 Encounters:  03/11/23 110/70  09/05/22 128/82  06/20/21 116/74    Wt Readings from Last 3 Encounters:  03/11/23 150 lb (68 kg)  09/05/22 152 lb 4.8 oz (69.1 kg)  06/20/21 153 lb 6.4 oz (69.6 kg)    Physical Exam Constitutional:      General: She is not in acute distress.    Appearance: Normal appearance. She is well-developed.  HENT:     Head: Normocephalic.     Right Ear: External ear normal.     Left Ear: External ear normal.     Nose: Nose normal.  Eyes:     General:        Right eye: No discharge.        Left eye: No discharge.     Conjunctiva/sclera: Conjunctivae normal.     Pupils: Pupils are equal, round, and reactive to light.  Neck:     Thyroid: No thyromegaly.     Vascular: No JVD.     Trachea: No tracheal deviation.  Cardiovascular:     Rate and Rhythm: Normal rate and regular  rhythm.     Heart sounds: Normal heart sounds.  Pulmonary:     Effort: No respiratory distress.     Breath sounds: No stridor. No wheezing.  Abdominal:     General: Bowel sounds are normal. There is no distension.     Palpations: Abdomen is soft. There is no mass.     Tenderness: There is no abdominal tenderness. There is no guarding or rebound.  Musculoskeletal:        General: No tenderness.     Cervical back: Normal range of motion and neck supple. No rigidity.  Lymphadenopathy:     Cervical: No cervical adenopathy.  Skin:    Findings: No erythema or rash.  Neurological:     Mental Status: She is oriented to person, place, and time. Mental status is at baseline.     Cranial Nerves: No cranial nerve deficit.     Motor: No abnormal muscle tone.     Coordination: Coordination normal.     Deep  Tendon Reflexes: Reflexes normal.  Psychiatric:        Behavior: Behavior normal.        Thought Content: Thought content normal.        Judgment: Judgment normal.   Looks a little tired  Lab Results  Component Value Date   WBC 5.8 09/05/2022   HGB 13.2 09/05/2022   HCT 38.8 09/05/2022   PLT 260.0 09/05/2022   GLUCOSE 75 09/05/2022   CHOL 155 09/05/2022   TRIG 27.0 09/05/2022   HDL 82.80 09/05/2022   LDLCALC 66 09/05/2022   ALT 27 09/05/2022   AST 21 09/05/2022   NA 140 09/05/2022   K 3.7 09/05/2022   CL 103 09/05/2022   CREATININE 0.81 09/05/2022   BUN 9 09/05/2022   CO2 30 09/05/2022   TSH 0.68 09/05/2022   INR 2.1 07/03/2012   HGBA1C 5.5 06/04/2021    No results found.  Assessment & Plan:   Problem List Items Addressed This Visit     Depressive disorder    Better.Fatigue is likely due to psychiatric meds Keep ROV with Ms Mozingo Obtain labs - CBC, CMET, TSH, UA      Relevant Orders   Comprehensive metabolic panel   CBC with Differential/Platelet   T4, free   TSH   Insomnia    On Lunesta prn      Chronic fatigue - Primary    Severe. Likely due to psychiatric meds Keep ROV with Ms Mozingo Obtain labs - CBC, CMET, TSH, UA      Relevant Orders   Comprehensive metabolic panel   CBC with Differential/Platelet   T4, free   TSH      No orders of the defined types were placed in this encounter.     Follow-up: Return in about 6 months (around 09/09/2023) for Wellness Exam.  Sonda Primes, MD

## 2023-03-11 NOTE — Assessment & Plan Note (Addendum)
Severe. Likely due to psychiatric meds Keep ROV with Ms Mozingo Obtain labs - CBC, CMET, TSH, UA

## 2023-03-19 ENCOUNTER — Other Ambulatory Visit: Payer: Self-pay | Admitting: Adult Health

## 2023-03-30 ENCOUNTER — Other Ambulatory Visit: Payer: Self-pay | Admitting: Adult Health

## 2023-03-30 DIAGNOSIS — F29 Unspecified psychosis not due to a substance or known physiological condition: Secondary | ICD-10-CM

## 2023-04-08 ENCOUNTER — Telehealth: Payer: No Typology Code available for payment source | Admitting: Adult Health

## 2023-04-08 ENCOUNTER — Encounter: Payer: Self-pay | Admitting: Adult Health

## 2023-04-08 DIAGNOSIS — F29 Unspecified psychosis not due to a substance or known physiological condition: Secondary | ICD-10-CM

## 2023-04-08 DIAGNOSIS — F3341 Major depressive disorder, recurrent, in partial remission: Secondary | ICD-10-CM

## 2023-04-08 DIAGNOSIS — G4700 Insomnia, unspecified: Secondary | ICD-10-CM | POA: Diagnosis not present

## 2023-04-08 DIAGNOSIS — F339 Major depressive disorder, recurrent, unspecified: Secondary | ICD-10-CM

## 2023-04-08 MED ORDER — RISPERIDONE 4 MG PO TABS: 4.0000 mg | ORAL_TABLET | Freq: Every day | ORAL | 2 refills | Status: DC

## 2023-04-08 MED ORDER — ARIPIPRAZOLE 5 MG PO TABS: ORAL_TABLET | ORAL | 2 refills | Status: DC

## 2023-04-08 NOTE — Progress Notes (Signed)
Tonya Watkins [id number] Oct 09, 1970 52 y.o.  Virtual Visit via Video Note  I connected with pt @ on 04/08/23 at  5:00 PM EST by a video enabled telemedicine application and verified that I am speaking with the correct person using two identifiers.   I discussed the limitations of evaluation and management by telemedicine and the availability of in person appointments. The patient expressed understanding and agreed to proceed.  I discussed the assessment and treatment plan with the patient. The patient was provided an opportunity to ask questions and all were answered. The patient agreed with the plan and demonstrated an understanding of the instructions.   The patient was advised to call back or seek an in-person evaluation if the symptoms worsen or if the condition fails to improve as anticipated.  I provided 15 minutes of non-face-to-face time during this encounter.  The patient was located at home.  The provider was located at [facility].   Dorothyann Gibbs, NP   Subjective:   Patient ID:  Tonya Watkins is a 52 y.o. (DOB [birth year]) female.  Chief Complaint: No chief complaint on file.   HPI Dash Gaultney presents for follow-up of MDD, psychosis and insomnia.  Describes mood today as "ok". Pleasant. Tearful at times. Mood symptoms - reports decreased depression - "gets down sometimes - Leonor of sad". Denies irritability. Reports feeling anxious "sometimes". Denies panic attacks. Denies worry, rumination, and over thinking. Denies obsessive thoughts and acts. Mood is stable. Stating "I feel like I'm doing ok". Reports medications are helpful, but is still hearing a man's voice - "saying negative things and talking all day long". Varying interest and motivation. Taking medications as prescribed. Energy levels stable. Active, does not have a regular exercise routine.  Enjoys some usual interests and activities. Divorced. Has 3 children 26, 22 and 16 at home. Mother lives with  her during the week. Spending time with family. Appetite adequate. Weight stable - 145 pounds. Sleep has improved. Averages 6 to 8 hours. Denies daytime napping.  Reports focus and concentration difficulties. Completing tasks. Managing aspects of household. 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 Reports VH - internal. Hearing a female voice - "not as bad". Denies AH. Denies self harm. Denies substance use.  Previous medication trials:  Wellbutrin XL 300mg  for years up until hospitalization.   Review of Systems:  Review of Systems  Musculoskeletal:  Negative for gait problem.  Neurological:  Negative for tremors.  Psychiatric/Behavioral:         Please refer to HPI    Medications: I have reviewed the patient's current medications.  Current Outpatient Medications  Medication Sig Dispense Refill   ARIPiprazole (ABILIFY) 5 MG tablet Take 1 tablet (5 mg total) by mouth daily. 30 tablet 2   benztropine (COGENTIN) 0.5 MG tablet Take 1 tablet (0.5 mg total) by mouth at bedtime as needed for tremors. 90 tablet 1   buPROPion (WELLBUTRIN XL) 300 MG 24 hr tablet Take 1 tablet (300 mg total) by mouth daily. 30 tablet 2   eszopiclone (LUNESTA) 2 MG TABS tablet Take 1 tablet (2 mg total) by mouth at bedtime. Take immediately before bedtime 30 tablet 2   Melatonin 10 MG TABS Take by mouth.     risperiDONE (RISPERDAL) 3 MG tablet TAKE 1 TABLET (3 MG TOTAL) BY MOUTH TWICE A DAY 180 tablet 0   valACYclovir (VALTREX) 500 MG tablet Take 1 tablet (500 mg total) by mouth daily. Annual appt is due  must see provider for future refills 30 tablet 0   No current facility-administered medications for this visit.    Medication Side Effects: None  Allergies:  Allergies  Allergen Reactions   Mirtazapine     Weight gain    Past Medical History:  Diagnosis Date   Depression    DVT (deep venous thrombosis) (HCC)    left calf   Heart palpitations    Medical history  non-contributory     Family History  Problem Relation Age of Onset   Allergies Mother    Cancer Mother        thyroid   Heart block Maternal Grandmother    Mental illness Neg Hx     Social History   Socioeconomic History   Marital status: Divorced    Spouse name: Not on file   Number of children: 3   Years of education: Not on file   Highest education level: Associate degree: occupational, Scientist, product/process development, or vocational program  Occupational History   Occupation: Charity fundraiser  Tobacco Use   Smoking status: Never   Smokeless tobacco: Never  Vaping Use   Vaping status: Never Used  Substance and Sexual Activity   Alcohol use: Not Currently   Drug use: Not Currently   Sexual activity: Not Currently  Other Topics Concern   Not on file  Social History Narrative   Not on file   Social Determinants of Health   Financial Resource Strain: Not on file  Food Insecurity: Not on file  Transportation Needs: Not on file  Physical Activity: Not on file  Stress: Not on file  Social Connections: Not on file  Intimate Partner Violence: Not on file    Past Medical History, Surgical history, Social history, and Family history were reviewed and updated as appropriate.   Please see review of systems for further details on the patient's review from today.   Objective:   Physical Exam:  There were no vitals taken for this visit.  Physical Exam Constitutional:      General: She is not in acute distress. Musculoskeletal:        General: No deformity.  Neurological:     Mental Status: She is alert and oriented to person, place, and time.     Coordination: Coordination normal.  Psychiatric:        Attention and Perception: Attention and perception normal. She does not perceive auditory or visual hallucinations.        Mood and Affect: Affect is not labile, blunt, angry or inappropriate.        Speech: Speech normal.        Behavior: Behavior normal.        Thought Content: Thought content  normal. Thought content is not paranoid or delusional. Thought content does not include homicidal or suicidal ideation. Thought content does not include homicidal or suicidal plan.        Cognition and Memory: Cognition and memory normal.        Judgment: Judgment normal.     Comments: Insight intact     Lab Review:     Component Value Date/Time   NA 140 03/11/2023 1407   K 3.6 03/11/2023 1407   CL 103 03/11/2023 1407   CO2 31 03/11/2023 1407   GLUCOSE 91 03/11/2023 1407   BUN 10 03/11/2023 1407   CREATININE 0.74 03/11/2023 1407   CREATININE 0.82 12/16/2019 1614   CALCIUM 10.0 03/11/2023 1407   PROT 7.7 03/11/2023 1407   ALBUMIN 4.6 03/11/2023  1407   AST 24 03/11/2023 1407   ALT 35 03/11/2023 1407   ALKPHOS 47 03/11/2023 1407   BILITOT 0.5 03/11/2023 1407   GFRNONAA >60 06/06/2021 1150   GFRNONAA 85 12/16/2019 1614   GFRAA 98 12/16/2019 1614       Component Value Date/Time   WBC 4.9 03/11/2023 1407   RBC 4.02 03/11/2023 1407   HGB 12.8 03/11/2023 1407   HCT 39.2 03/11/2023 1407   PLT 257.0 03/11/2023 1407   MCV 97.6 03/11/2023 1407   MCH 31.3 06/06/2021 1150   MCHC 32.7 03/11/2023 1407   RDW 12.2 03/11/2023 1407   LYMPHSABS 1.3 03/11/2023 1407   MONOABS 0.4 03/11/2023 1407   EOSABS 0.1 03/11/2023 1407   BASOSABS 0.0 03/11/2023 1407    No results found for: "POCLITH", "LITHIUM"   No results found for: "PHENYTOIN", "PHENOBARB", "VALPROATE", "CBMZ"   .res Assessment: Plan:    Plan:  PDMP reviewed  Increase Abilify 5mg  to 7.5mg  daily for psychosis. Reduce Risperidone 3mg  BID to 4mg  at bedtime - will plan to taper off with lack of effectiveness.  Wellbutrin XL 300 mg daily Benztropine 0.5 mg at bedtime - denies akathesia or TD  Lunesta 2mg  at hs  Melatonin 10 mg p.o. nightly as needed - uses every night  EKG - 04/25 Sinus Brady - HR 57  Time spent with patient was 25 minutes. Greater than 50% of face to face time with patient was spent on counseling  and coordination of care.    RTC 4 weeks  Patient advised to contact office with any questions, adverse effects, or acute worsening in signs and symptoms.   Discussed potential metabolic side effects associated with atypical antipsychotics, as well as potential risk for movement side effects. Advised pt to contact office if movement side effects occur.   There are no diagnoses linked to this encounter.   Please see After Visit Summary for patient specific instructions.  Future Appointments  Date Time Provider Department Center  04/08/2023  5:00 PM Mykia Holton, Thereasa Solo, NP [facility] None  09/09/2023  3:40 PM Plotnikov, Georgina Quint, MD [facility] None    No orders of the defined types were placed in this encounter.     -------------------------------

## 2023-05-01 ENCOUNTER — Other Ambulatory Visit: Payer: Self-pay | Admitting: Adult Health

## 2023-05-01 DIAGNOSIS — F29 Unspecified psychosis not due to a substance or known physiological condition: Secondary | ICD-10-CM

## 2023-05-14 ENCOUNTER — Telehealth: Payer: No Typology Code available for payment source | Admitting: Physician Assistant

## 2023-05-14 DIAGNOSIS — R3989 Other symptoms and signs involving the genitourinary system: Secondary | ICD-10-CM

## 2023-05-14 MED ORDER — NITROFURANTOIN MONOHYD MACRO 100 MG PO CAPS: 100.0000 mg | ORAL_CAPSULE | Freq: Two times a day (BID) | ORAL | 0 refills | Status: DC

## 2023-05-14 NOTE — Progress Notes (Signed)
 Virtual Visit Consent   Tonya Watkins, you are scheduled for a virtual visit with a [facility] provider today. Just as with appointments in the office, your consent must be obtained to participate. Your consent will be active for this visit and any virtual visit you may have with one of our providers in the next 365 days. If you have a MyChart account, a copy of this consent can be sent to you electronically.  As this is a virtual visit, video technology does not allow for your provider to perform a traditional examination. This may limit your provider's ability to fully assess your condition. If your provider identifies any concerns that need to be evaluated in person or the need to arrange testing (such as labs, EKG, etc.), we will make arrangements to do so. Although advances in technology are sophisticated, we cannot ensure that it will always work on either your end or our end. If the connection with a video visit is poor, the visit may have to be switched to a telephone visit. With either a video or telephone visit, we are not always able to ensure that we have a secure connection.  By engaging in this virtual visit, you consent to the provision of healthcare and authorize for your insurance to be billed (if applicable) for the services provided during this visit. Depending on your insurance coverage, you may receive a charge related to this service.  I need to obtain your verbal consent now. Are you willing to proceed with your visit today? Tonya Watkins has provided verbal consent on 05/14/2023 for a virtual visit (video or telephone). Tonya CHRISTELLA Dickinson, PA-C  Date: 05/14/2023 4:31 PM  Virtual Visit via Video Note   IDelon CHRISTELLA Watkins, connected with  Tonya Watkins  ([id number], 1970-08-14) on 05/14/23 at  4:30 PM EST by a video-enabled telemedicine application and verified that I am speaking with the correct person using two identifiers.  Location: Patient: Virtual Visit Location  Patient: Home Provider: Virtual Visit Location Provider: Home Office   I discussed the limitations of evaluation and management by telemedicine and the availability of in person appointments. The patient expressed understanding and agreed to proceed.    History of Present Illness: Tonya Watkins is a 53 y.o. who identifies as a female who was assigned female at birth, and is being seen today for dysuria.  HPI: Urinary Tract Infection  This is a new problem. The current episode started yesterday. The problem occurs every urination. The problem has been gradually worsening. The quality of the pain is described as stabbing (at the end of urination). The pain is mild. There has been no fever. There is No history of pyelonephritis. Associated symptoms include frequency and urgency. Pertinent negatives include no chills, discharge, flank pain, hematuria, hesitancy, nausea or vomiting. Treatments tried: Cystex, cranberry tabs. The treatment provided no relief.      Problems:  Patient Active Problem List   Diagnosis Date Noted   Therapeutic drug monitoring 09/05/2022   Noncompliance with medication regimen 11/21/2021   At risk for prolonged QT interval syndrome 10/22/2021   Recurrent major depressive disorder, in full remission (HCC) 09/20/2021   MDD (major depressive disorder), recurrent, severe, with psychosis (HCC) 07/20/2021   High risk medication use 07/20/2021   Major depressive disorder, single episode, severe with psychotic features (HCC) 06/13/2021   Chest pain 06/09/2021   PVC's (premature ventricular contractions) 06/09/2021   Brief reactive psychosis (HCC) 06/07/2021   Psychosis (HCC) 06/02/2021   Well adult  exam 06/30/2018   Thromboembolism of vein 05/28/2018   Arthritis 05/27/2018   Genital herpes simplex 05/27/2018   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   Tinnitus 08/15/2017   Middle ear effusion, right 08/15/2017   Chronic fatigue 04/10/2016   CTS (carpal tunnel syndrome)  03/10/2013   Depression, reactive 03/10/2013   OSA (obstructive sleep apnea) 11/24/2012   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 10/02/2012   Obesity 10/02/2012   Eczema 03/09/2012   Axillary adenopathy 01/01/2012   Palpitations 01/01/2012   Panic attacks 11/20/2008   PANIC DISORDER 09/21/2008   Depressive disorder 09/21/2008   Insomnia 09/21/2008   SHOULDER PAIN 11/16/2007   Sinusitis, chronic 10/13/2007   Allergic rhinitis 10/13/2007   FATIGUE 10/13/2007   ANEMIA, HX OF 10/13/2007    Allergies:  Allergies  Allergen Reactions   Mirtazapine      Weight gain   Medications:  Current Outpatient Medications:    ARIPiprazole  (ABILIFY ) 5 MG tablet, Take one and 1/2 tablets every morning., Disp: 45 tablet, Rfl: 2   benztropine  (COGENTIN ) 0.5 MG tablet, Take 1 tablet (0.5 mg total) by mouth at bedtime as needed for tremors., Disp: 90 tablet, Rfl: 1   buPROPion  (WELLBUTRIN  XL) 300 MG 24 hr tablet, Take 1 tablet (300 mg total) by mouth daily., Disp: 30 tablet, Rfl: 2   eszopiclone  (LUNESTA ) 2 MG TABS tablet, Take 1 tablet (2 mg total) by mouth at bedtime. Take immediately before bedtime, Disp: 30 tablet, Rfl: 2   Melatonin 10 MG TABS, Take by mouth., Disp: , Rfl:    nitrofurantoin , macrocrystal-monohydrate, (MACROBID ) 100 MG capsule, Take 1 capsule (100 mg total) by mouth 2 (two) times daily., Disp: 10 capsule, Rfl: 0   risperiDONE  (RISPERDAL ) 4 MG tablet, Take 1 tablet (4 mg total) by mouth at bedtime., Disp: 30 tablet, Rfl: 2   valACYclovir  (VALTREX ) 500 MG tablet, Take 1 tablet (500 mg total) by mouth daily. Annual appt is due must see provider for future refills, Disp: 30 tablet, Rfl: 0  Observations/Objective: Patient is well-developed, well-nourished in no acute distress.  Resting comfortably at home.  Head is normocephalic, atraumatic.  No labored breathing.  Speech is clear and coherent with logical content.  Patient is alert and oriented at baseline.    Assessment and  Plan: 1. Suspected UTI (Primary) - nitrofurantoin , macrocrystal-monohydrate, (MACROBID ) 100 MG capsule; Take 1 capsule (100 mg total) by mouth 2 (two) times daily.  Dispense: 10 capsule; Refill: 0  - Worsening symptoms.  - Will treat empirically with Macrobid  - May use Cystex for bladder spasms - Continue to push fluids.  - Seek in person evaluation for urine culture if symptoms do not improve or if they worsen.    Follow Up Instructions: I discussed the assessment and treatment plan with the patient. The patient was provided an opportunity to ask questions and all were answered. The patient agreed with the plan and demonstrated an understanding of the instructions.  A copy of instructions were sent to the patient via MyChart unless otherwise noted below.    The patient was advised to call back or seek an in-person evaluation if the symptoms worsen or if the condition fails to improve as anticipated.    Tonya CHRISTELLA Dickinson, PA-C

## 2023-05-14 NOTE — Patient Instructions (Signed)
 Dyjxjpmj Falcon, thank you for joining [name], PA-C for today's virtual visit.  While this provider is not your primary care provider (PCP), if your PCP is located in our provider database this encounter information will be shared with them immediately following your visit.   A [facility] MyChart account gives you access to today's visit and all your visits, tests, and labs performed at [facility]  click here if you don't have a [facility] MyChart account or go to [email or web address]  Consent: (Patient) Tonya Watkins provided verbal consent for this virtual visit at the beginning of the encounter.  Current Medications:  Current Outpatient Medications:    ARIPiprazole  (ABILIFY ) 5 MG tablet, Take one and 1/2 tablets every morning., Disp: 45 tablet, Rfl: 2   benztropine  (COGENTIN ) 0.5 MG tablet, Take 1 tablet (0.5 mg total) by mouth at bedtime as needed for tremors., Disp: 90 tablet, Rfl: 1   buPROPion  (WELLBUTRIN  XL) 300 MG 24 hr tablet, Take 1 tablet (300 mg total) by mouth daily., Disp: 30 tablet, Rfl: 2   eszopiclone  (LUNESTA ) 2 MG TABS tablet, Take 1 tablet (2 mg total) by mouth at bedtime. Take immediately before bedtime, Disp: 30 tablet, Rfl: 2   Melatonin 10 MG TABS, Take by mouth., Disp: , Rfl:    nitrofurantoin , macrocrystal-monohydrate, (MACROBID ) 100 MG capsule, Take 1 capsule (100 mg total) by mouth 2 (two) times daily., Disp: 10 capsule, Rfl: 0   risperiDONE  (RISPERDAL ) 4 MG tablet, Take 1 tablet (4 mg total) by mouth at bedtime., Disp: 30 tablet, Rfl: 2   valACYclovir  (VALTREX ) 500 MG tablet, Take 1 tablet (500 mg total) by mouth daily. Annual appt is due must see provider for future refills, Disp: 30 tablet, Rfl: 0   Medications ordered in this encounter:  Meds ordered this encounter  Medications   nitrofurantoin , macrocrystal-monohydrate, (MACROBID ) 100 MG capsule    Sig: Take 1 capsule (100 mg total) by mouth 2 (two) times daily.    Dispense:   10 capsule    Refill:  0    Supervising Provider:   BLAISE ALEENE KIDD [[id number]]     *If you need refills on other medications prior to your next appointment, please contact your pharmacy*  Follow-Up: Call back or seek an in-person evaluation if the symptoms worsen or if the condition fails to improve as anticipated.  [facility] [phone]  Other Instructions Urinary Tract Infection, Adult  A urinary tract infection (UTI) is an infection of any part of the urinary tract. The urinary tract includes the kidneys, ureters, bladder, and urethra. These organs make, store, and get rid of urine in the body. An upper UTI affects the ureters and kidneys. A lower UTI affects the bladder and urethra. What are the causes? Most urinary tract infections are caused by bacteria in your genital area around your urethra, where urine leaves your body. These bacteria grow and cause inflammation of your urinary tract. What increases the risk? You are more likely to develop this condition if: You have a urinary catheter that stays in place. You are not able to control when you urinate or have a bowel movement (incontinence). You are female and you: Use a spermicide or diaphragm for birth control. Have low estrogen levels. Are pregnant. You have certain genes that increase your risk. You are sexually active. You take antibiotic medicines. You have a condition that causes your flow of urine to slow down, such as: An enlarged prostate, if you are female.  Blockage in your urethra. A kidney stone. A nerve condition that affects your bladder control (neurogenic bladder). Not getting enough to drink, or not urinating often. You have certain medical conditions, such as: Diabetes. A weak disease-fighting system (immunesystem). Sickle cell disease. Gout. Spinal cord injury. What are the signs or symptoms? Symptoms of this condition include: Needing to urinate right away (urgency). Frequent  urination. This may include small amounts of urine each time you urinate. Pain or burning with urination. Blood in the urine. Urine that smells bad or unusual. Trouble urinating. Cloudy urine. Vaginal discharge, if you are female. Pain in the abdomen or the lower back. You may also have: Vomiting or a decreased appetite. Confusion. Irritability or tiredness. A fever or chills. Diarrhea. The first symptom in older adults may be confusion. In some cases, they may not have any symptoms until the infection has worsened. How is this diagnosed? This condition is diagnosed based on your medical history and a physical exam. You may also have other tests, including: Urine tests. Blood tests. Tests for STIs (sexually transmitted infections). If you have had more than one UTI, a cystoscopy or imaging studies may be done to determine the cause of the infections. How is this treated? Treatment for this condition includes: Antibiotic medicine. Over-the-counter medicines to treat discomfort. Drinking enough water to stay hydrated. If you have frequent infections or have other conditions such as a kidney stone, you may need to see a health care provider who specializes in the urinary tract (urologist). In rare cases, urinary tract infections can cause sepsis. Sepsis is a life-threatening condition that occurs when the body responds to an infection. Sepsis is treated in the hospital with IV antibiotics, fluids, and other medicines. Follow these instructions at home:  Medicines Take over-the-counter and prescription medicines only as told by your health care provider. If you were prescribed an antibiotic medicine, take it as told by your health care provider. Do not stop using the antibiotic even if you start to feel better. General instructions Make sure you: Empty your bladder often and completely. Do not hold urine for long periods of time. Empty your bladder after sex. Wipe from front to back  after urinating or having a bowel movement if you are female. Use each tissue only one time when you wipe. Drink enough fluid to keep your urine pale yellow. Keep all follow-up visits. This is important. Contact a health care provider if: Your symptoms do not get better after 1-2 days. Your symptoms go away and then return. Get help right away if: You have severe pain in your back or your lower abdomen. You have a fever or chills. You have nausea or vomiting. Summary A urinary tract infection (UTI) is an infection of any part of the urinary tract, which includes the kidneys, ureters, bladder, and urethra. Most urinary tract infections are caused by bacteria in your genital area. Treatment for this condition often includes antibiotic medicines. If you were prescribed an antibiotic medicine, take it as told by your health care provider. Do not stop using the antibiotic even if you start to feel better. Keep all follow-up visits. This is important. This information is not intended to replace advice given to you by your health care provider. Make sure you discuss any questions you have with your health care provider. Document Revised: 12/05/2019 Document Reviewed: 12/10/2019 Elsevier Patient Education  2024 [employer].    If you have been instructed to have an in-person evaluation today at a local  Urgent Care facility, please use the link below. It will take you to a list of all of our available [facility], including address, phone number and hours of operation. Please do not delay care.  [facility]  If you or a family member do not have a primary care provider, use the link below to schedule a visit and establish care. When you choose a [facility] primary care physician or advanced practice provider, you gain a long-term partner in health. Find a Primary Care Provider  Learn more about [facility] in-office and virtual care options: [facility] - Get Care  Now

## 2023-05-21 ENCOUNTER — Encounter: Payer: Self-pay | Admitting: Adult Health

## 2023-05-21 ENCOUNTER — Telehealth: Payer: No Typology Code available for payment source | Admitting: Adult Health

## 2023-05-21 ENCOUNTER — Other Ambulatory Visit: Payer: Self-pay | Admitting: Adult Health

## 2023-05-21 DIAGNOSIS — F29 Unspecified psychosis not due to a substance or known physiological condition: Secondary | ICD-10-CM

## 2023-05-21 DIAGNOSIS — G4700 Insomnia, unspecified: Secondary | ICD-10-CM

## 2023-05-21 DIAGNOSIS — F3341 Major depressive disorder, recurrent, in partial remission: Secondary | ICD-10-CM

## 2023-05-21 MED ORDER — BUPROPION HCL ER (XL) 300 MG PO TB24: 300.0000 mg | ORAL_TABLET | Freq: Every day | ORAL | 2 refills | Status: DC

## 2023-05-21 MED ORDER — ARIPIPRAZOLE 10 MG PO TABS: ORAL_TABLET | ORAL | 2 refills | Status: DC

## 2023-05-21 MED ORDER — ESZOPICLONE 2 MG PO TABS
2.0000 mg | ORAL_TABLET | Freq: Every day | ORAL | 2 refills | Status: DC
Start: 2023-05-21 — End: 2023-07-24

## 2023-05-21 MED ORDER — RISPERIDONE 4 MG PO TABS: 4.0000 mg | ORAL_TABLET | Freq: Every day | ORAL | 2 refills | Status: DC

## 2023-05-21 NOTE — Progress Notes (Signed)
 Tonya Watkins [id number] 1971/03/11 53 y.o.  Virtual Visit via Video Note  I connected with pt @ on 05/21/23 at  5:00 PM EST by a video enabled telemedicine application and verified that I am speaking with the correct person using two identifiers.   I discussed the limitations of evaluation and management by telemedicine and the availability of in person appointments. The patient expressed understanding and agreed to proceed.  I discussed the assessment and treatment plan with the patient. The patient was provided an opportunity to ask questions and all were answered. The patient agreed with the plan and demonstrated an understanding of the instructions.   The patient was advised to call back or seek an in-person evaluation if the symptoms worsen or if the condition fails to improve as anticipated.  I provided 15 minutes of non-face-to-face time during this encounter.  The patient was located at home.  The provider was located at [facility].   Tonya LOISE Sayers, NP   Subjective:   Patient ID:  Tonya Watkins is a 53 y.o. (DOB 1971-02-08) female.  Chief Complaint: No chief complaint on file.   HPI Tonya Watkins presents for follow-up of MDD, psychosis and insomnia.  Describes mood today as ok. Pleasant. Denies tearfulness. Mood symptoms - reports decreased depression and anxiety. Reports irritability - sometimes. Denies panic attacks. Reports some worry, rumination, and over thinking. Denies obsessive thoughts and acts. Mood is more consistent. Stating I feel like I'm doing a little bit better. Reports medications are helpful, but is still hearing a man's voice. Varying interest and motivation. Taking medications as prescribed. Energy levels could be better. Active, does not have a regular exercise routine.  Enjoys some usual interests and activities. Divorced. Has 3 children 26, 22 and 16 at home. Mother lives with her during the week. Spending time with  family. Appetite adequate. Weight stable - 145 pounds. Sleep has improved. Averages 6 to 8 hours. Denies daytime napping.  Reports focus and concentration difficulties. Completing tasks. Managing aspects of household. 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 Reports VH - Hearing a female voice - not as bad, but still there. Denies AH. Denies self harm. Denies substance use.  Previous medication trials:  Wellbutrin  XL 300mg  for years up until hospitalization.   Review of Systems:  Review of Systems  Musculoskeletal:  Negative for gait problem.  Neurological:  Negative for tremors.  Psychiatric/Behavioral:         Please refer to HPI    Medications: I have reviewed the patient's current medications.  Current Outpatient Medications  Medication Sig Dispense Refill   ARIPiprazole  (ABILIFY ) 10 MG tablet Take one tablet every morning. 30 tablet 2   benztropine  (COGENTIN ) 0.5 MG tablet Take 1 tablet (0.5 mg total) by mouth at bedtime as needed for tremors. 90 tablet 1   buPROPion  (WELLBUTRIN  XL) 300 MG 24 hr tablet Take 1 tablet (300 mg total) by mouth daily. 30 tablet 2   eszopiclone  (LUNESTA ) 2 MG TABS tablet Take 1 tablet (2 mg total) by mouth at bedtime. Take immediately before bedtime 30 tablet 2   Melatonin 10 MG TABS Take by mouth.     nitrofurantoin , macrocrystal-monohydrate, (MACROBID ) 100 MG capsule Take 1 capsule (100 mg total) by mouth 2 (two) times daily. 10 capsule 0   risperidone  (RISPERDAL ) 4 MG tablet Take 1 tablet (4 mg total) by mouth at bedtime. 30 tablet 2   valACYclovir  (VALTREX ) 500 MG tablet Take 1 tablet (500  mg total) by mouth daily. Annual appt is due must see provider for future refills 30 tablet 0   No current facility-administered medications for this visit.    Medication Side Effects: None  Allergies:  Allergies  Allergen Reactions   Mirtazapine      Weight gain    Past Medical History:  Diagnosis Date   Depression    DVT  (deep venous thrombosis) (HCC)    left calf   Heart palpitations    Medical history non-contributory     Family History  Problem Relation Age of Onset   Allergies Mother    Cancer Mother        thyroid    Heart block Maternal Grandmother    Mental illness Neg Hx     Social History   Socioeconomic History   Marital status: Divorced    Spouse name: Not on file   Number of children: 3   Years of education: Not on file   Highest education level: Associate degree: occupational, scientist, product/process development, or vocational program  Occupational History   Occupation: [employer]  Tobacco Use   Smoking status: Never   Smokeless tobacco: Never  Vaping Use   Vaping status: Never Used  Substance and Sexual Activity   Alcohol use: Not Currently   Drug use: Not Currently   Sexual activity: Not Currently  Other Topics Concern   Not on file  Social History Narrative   Not on file   Social Drivers of Health   Financial Resource Strain: Not on file  Food Insecurity: Not on file  Transportation Needs: Not on file  Physical Activity: Not on file  Stress: Not on file  Social Connections: Not on file  Intimate Partner Violence: Not on file    Past Medical History, Surgical history, Social history, and Family history were reviewed and updated as appropriate.   Please see review of systems for further details on the patient's review from today.   Objective:   Physical Exam:  There were no vitals taken for this visit.  Physical Exam Constitutional:      General: She is not in acute distress. Musculoskeletal:        General: No deformity.  Neurological:     Mental Status: She is alert and oriented to person, place, and time.     Coordination: Coordination normal.  Psychiatric:        Attention and Perception: Attention and perception normal. She does not perceive auditory or visual hallucinations.        Mood and Affect: Affect is not labile, blunt, angry or inappropriate.        Speech: Speech  normal.        Behavior: Behavior normal.        Thought Content: Thought content normal. Thought content is not paranoid or delusional. Thought content does not include homicidal or suicidal ideation. Thought content does not include homicidal or suicidal plan.        Cognition and Memory: Cognition and memory normal.        Judgment: Judgment normal.     Comments: Insight intact     Lab Review:     Component Value Date/Time   NA 140 03/11/2023 1407   K 3.6 03/11/2023 1407   CL 103 03/11/2023 1407   CO2 31 03/11/2023 1407   GLUCOSE 91 03/11/2023 1407   BUN 10 03/11/2023 1407   CREATININE 0.74 03/11/2023 1407   CREATININE 0.82 12/16/2019 1614   CALCIUM 10.0 03/11/2023 1407  PROT 7.7 03/11/2023 1407   ALBUMIN 4.6 03/11/2023 1407   AST 24 03/11/2023 1407   ALT 35 03/11/2023 1407   ALKPHOS 47 03/11/2023 1407   BILITOT 0.5 03/11/2023 1407   GFRNONAA >60 06/06/2021 1150   GFRNONAA 85 12/16/2019 1614   GFRAA 98 12/16/2019 1614       Component Value Date/Time   WBC 4.9 03/11/2023 1407   RBC 4.02 03/11/2023 1407   HGB 12.8 03/11/2023 1407   HCT 39.2 03/11/2023 1407   PLT 257.0 03/11/2023 1407   MCV 97.6 03/11/2023 1407   MCH 31.3 06/06/2021 1150   MCHC 32.7 03/11/2023 1407   RDW 12.2 03/11/2023 1407   LYMPHSABS 1.3 03/11/2023 1407   MONOABS 0.4 03/11/2023 1407   EOSABS 0.1 03/11/2023 1407   BASOSABS 0.0 03/11/2023 1407    No results found for: POCLITH, LITHIUM   No results found for: PHENYTOIN, PHENOBARB, VALPROATE, CBMZ   .res Assessment: Plan:    Plan:  PDMP reviewed  Abilify  7.5mg  to 10mg  daily for psychosis. Risperidone  - 4mg  at bedtime for psychosis - will plan to lower dose of Risperdal  as Abilify  is escalated.  Wellbutrin  XL 300 mg daily Benztropine  0.5 mg at bedtime - denies akathesia or TD  Lunesta  2mg  at hs  Melatonin 10 mg p.o. nightly as needed - uses every night  EKG - 04/25 Sinus Brady - HR 57  Time spent with patient was 25  minutes. Greater than 50% of face to face time with patient was spent on counseling and coordination of care.    RTC 4 weeks  Patient advised to contact office with any questions, adverse effects, or acute worsening in signs and symptoms.   Discussed potential metabolic side effects associated with atypical antipsychotics, as well as potential risk for movement side effects. Advised pt to contact office if movement side effects occur.   Diagnoses and all orders for this visit:  MDD (major depressive disorder), recurrent, in partial remission (HCC)  Psychosis, unspecified psychosis type (HCC) -     ARIPiprazole  (ABILIFY ) 10 MG tablet; Take one tablet every morning. -     buPROPion  (WELLBUTRIN  XL) 300 MG 24 hr tablet; Take 1 tablet (300 mg total) by mouth daily. -     risperidone  (RISPERDAL ) 4 MG tablet; Take 1 tablet (4 mg total) by mouth at bedtime.  Insomnia, unspecified type -     eszopiclone  (LUNESTA ) 2 MG TABS tablet; Take 1 tablet (2 mg total) by mouth at bedtime. Take immediately before bedtime     Please see After Visit Summary for patient specific instructions.  Future Appointments  Date Time Provider Department Center  09/09/2023  3:40 PM Plotnikov, Karlynn GAILS, MD [facility] None    No orders of the defined types were placed in this encounter.     -------------------------------

## 2023-05-28 ENCOUNTER — Other Ambulatory Visit: Payer: Self-pay | Admitting: Adult Health

## 2023-05-28 DIAGNOSIS — F29 Unspecified psychosis not due to a substance or known physiological condition: Secondary | ICD-10-CM

## 2023-06-12 ENCOUNTER — Other Ambulatory Visit: Payer: Self-pay | Admitting: Adult Health

## 2023-06-12 DIAGNOSIS — F29 Unspecified psychosis not due to a substance or known physiological condition: Secondary | ICD-10-CM

## 2023-06-17 ENCOUNTER — Telehealth: Payer: No Typology Code available for payment source | Admitting: Adult Health

## 2023-06-17 ENCOUNTER — Encounter: Payer: Self-pay | Admitting: Adult Health

## 2023-06-17 DIAGNOSIS — F29 Unspecified psychosis not due to a substance or known physiological condition: Secondary | ICD-10-CM | POA: Diagnosis not present

## 2023-06-17 DIAGNOSIS — G4700 Insomnia, unspecified: Secondary | ICD-10-CM | POA: Diagnosis not present

## 2023-06-17 DIAGNOSIS — F3341 Major depressive disorder, recurrent, in partial remission: Secondary | ICD-10-CM | POA: Diagnosis not present

## 2023-06-17 MED ORDER — ARIPIPRAZOLE 15 MG PO TABS: 15.0000 mg | ORAL_TABLET | Freq: Every morning | ORAL | 2 refills | Status: DC

## 2023-06-17 MED ORDER — RISPERIDONE 4 MG PO TABS: 4.0000 mg | ORAL_TABLET | Freq: Every day | ORAL | 2 refills | Status: DC

## 2023-06-17 NOTE — Progress Notes (Signed)
 Tonya Watkins [id number] [birth year] 53 y.o.  Virtual Visit via Video Note  I connected with pt @ on 06/17/23 at  5:00 PM EST by a video enabled telemedicine application and verified that I am speaking with the correct person using two identifiers.   I discussed the limitations of evaluation and management by telemedicine and the availability of in person appointments. The patient expressed understanding and agreed to proceed.  I discussed the assessment and treatment plan with the patient. The patient was provided an opportunity to ask questions and all were answered. The patient agreed with the plan and demonstrated an understanding of the instructions.   The patient was advised to call back or seek an in-person evaluation if the symptoms worsen or if the condition fails to improve as anticipated.  I provided 20 minutes of non-face-to-face time during this encounter.  The patient was located at home.  The provider was located at [facility].   Tonya LOISE Sayers, NP   Subjective:   Patient ID:  Tonya Watkins is a 52 y.o. (DOB 01-02-1971) female.  Chief Complaint: No chief complaint on file.   HPI Tonya Watkins presents for follow-up of MDD, psychosis and insomnia.  Describes mood today as ok. Pleasant. Denies tearfulness. Mood symptoms - reports depression and anxiety - that's better. Reports varying interest and motivation. Denies irritability. Denies panic attacks. Reports some worry, rumination, and over thinking. Denies obsessive thoughts and acts. Mood is more stable. Stating I feel like I'm doing better. Reports medications are helpful, but is still hearing a man's voice. Taking medications as prescribed. Energy levels lower. Active, does not have a regular exercise routine.  Enjoys some usual interests and activities. Divorced. Has 3 children 26, 22 and 16 at home. Mother lives with her during the week. Spending time with family. Appetite adequate. Weight loss -  140 from 145 pounds. Sleeping well most nights. Averages 7 to 8 hours.  Reports focus and concentration difficulties, but a little bit better. Completing tasks. Managing aspects of household. 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 Reports VH - Hearing a female voice - not as loud as it was before. Denies AH. Denies self harm. Denies substance use.  Previous medication trials:  Wellbutrin  XL 300mg  for years up until hospitalization.   Review of Systems:  Review of Systems  Musculoskeletal:  Negative for gait problem.  Neurological:  Negative for tremors.  Psychiatric/Behavioral:         Please refer to HPI    Medications: I have reviewed the patient's current medications.  Current Outpatient Medications  Medication Sig Dispense Refill   ARIPiprazole  (ABILIFY ) 15 MG tablet Take 1 tablet (15 mg total) by mouth every morning. 30 tablet 2   benztropine  (COGENTIN ) 0.5 MG tablet Take 1 tablet (0.5 mg total) by mouth at bedtime as needed for tremors. 90 tablet 1   buPROPion  (WELLBUTRIN  XL) 300 MG 24 hr tablet Take 1 tablet (300 mg total) by mouth daily. 30 tablet 2   eszopiclone  (LUNESTA ) 2 MG TABS tablet Take 1 tablet (2 mg total) by mouth at bedtime. Take immediately before bedtime 30 tablet 2   Melatonin 10 MG TABS Take by mouth.     nitrofurantoin , macrocrystal-monohydrate, (MACROBID ) 100 MG capsule Take 1 capsule (100 mg total) by mouth 2 (two) times daily. 10 capsule 0   risperidone  (RISPERDAL ) 4 MG tablet Take 1 tablet (4 mg total) by mouth at bedtime. 30 tablet 2   valACYclovir  (VALTREX )  500 MG tablet Take 1 tablet (500 mg total) by mouth daily. Annual appt is due must see provider for future refills 30 tablet 0   No current facility-administered medications for this visit.    Medication Side Effects: None  Allergies:  Allergies  Allergen Reactions   Mirtazapine      Weight gain    Past Medical History:  Diagnosis Date   Depression    DVT (deep  venous thrombosis) (HCC)    left calf   Heart palpitations    Medical history non-contributory     Family History  Problem Relation Age of Onset   Allergies Mother    Cancer Mother        thyroid    Heart block Maternal Grandmother    Mental illness Neg Hx     Social History   Socioeconomic History   Marital status: Divorced    Spouse name: Not on file   Number of children: 3   Years of education: Not on file   Highest education level: Associate degree: occupational, scientist, product/process development, or vocational program  Occupational History   Occupation: [employer]  Tobacco Use   Smoking status: Never   Smokeless tobacco: Never  Vaping Use   Vaping status: Never Used  Substance and Sexual Activity   Alcohol use: Not Currently   Drug use: Not Currently   Sexual activity: Not Currently  Other Topics Concern   Not on file  Social History Narrative   Not on file   Social Drivers of Health   Financial Resource Strain: Not on file  Food Insecurity: Not on file  Transportation Needs: Not on file  Physical Activity: Not on file  Stress: Not on file  Social Connections: Not on file  Intimate Partner Violence: Not on file    Past Medical History, Surgical history, Social history, and Family history were reviewed and updated as appropriate.   Please see review of systems for further details on the patient's review from today.   Objective:   Physical Exam:  There were no vitals taken for this visit.  Physical Exam  Lab Review:     Component Value Date/Time   NA 140 03/11/2023 1407   K 3.6 03/11/2023 1407   CL 103 03/11/2023 1407   CO2 31 03/11/2023 1407   GLUCOSE 91 03/11/2023 1407   BUN 10 03/11/2023 1407   CREATININE 0.74 03/11/2023 1407   CREATININE 0.82 12/16/2019 1614   CALCIUM 10.0 03/11/2023 1407   PROT 7.7 03/11/2023 1407   ALBUMIN 4.6 03/11/2023 1407   AST 24 03/11/2023 1407   ALT 35 03/11/2023 1407   ALKPHOS 47 03/11/2023 1407   BILITOT 0.5 03/11/2023 1407   GFRNONAA  >60 06/06/2021 1150   GFRNONAA 85 12/16/2019 1614   GFRAA 98 12/16/2019 1614       Component Value Date/Time   WBC 4.9 03/11/2023 1407   RBC 4.02 03/11/2023 1407   HGB 12.8 03/11/2023 1407   HCT 39.2 03/11/2023 1407   PLT 257.0 03/11/2023 1407   MCV 97.6 03/11/2023 1407   MCH 31.3 06/06/2021 1150   MCHC 32.7 03/11/2023 1407   RDW 12.2 03/11/2023 1407   LYMPHSABS 1.3 03/11/2023 1407   MONOABS 0.4 03/11/2023 1407   EOSABS 0.1 03/11/2023 1407   BASOSABS 0.0 03/11/2023 1407    No results found for: POCLITH, LITHIUM   No results found for: PHENYTOIN, PHENOBARB, VALPROATE, CBMZ   .res Assessment: Plan:    Plan:  PDMP reviewed  Increase Abilify  10mg  to  15 daily for psychosis. Decrease Risperidone  - 4mg  at bedtime for psychosis - will plan to lower dose of Risperdal  as Abilify  is escalated.  Wellbutrin  XL 300 mg daily Benztropine  0.5 mg at bedtime - denies akathesia or TD  Lunesta  2mg  at hs  Melatonin 10 mg p.o. nightly as needed - uses every night  EKG - 04/25 Sinus Brady - HR 57  Time spent with patient was 25 minutes. Greater than 50% of face to face time with patient was spent on counseling and coordination of care.    RTC 4 weeks  Patient advised to contact office with any questions, adverse effects, or acute worsening in signs and symptoms.   Discussed potential metabolic side effects associated with atypical antipsychotics, as well as potential risk for movement side effects. Advised pt to contact office if movement side effects occur.   Diagnoses and all orders for this visit:  Psychosis, unspecified psychosis type (HCC) -     ARIPiprazole  (ABILIFY ) 15 MG tablet; Take 1 tablet (15 mg total) by mouth every morning. -     risperidone  (RISPERDAL ) 4 MG tablet; Take 1 tablet (4 mg total) by mouth at bedtime.  MDD (major depressive disorder), recurrent, in partial remission (HCC)  Insomnia, unspecified type     Please see After Visit Summary for  patient specific instructions.  Future Appointments  Date Time Provider Department Center  09/09/2023  3:40 PM Plotnikov, Karlynn GAILS, MD [facility] None    No orders of the defined types were placed in this encounter.     -------------------------------

## 2023-07-13 ENCOUNTER — Other Ambulatory Visit: Payer: Self-pay | Admitting: Adult Health

## 2023-07-13 DIAGNOSIS — F29 Unspecified psychosis not due to a substance or known physiological condition: Secondary | ICD-10-CM

## 2023-07-16 ENCOUNTER — Telehealth: Payer: No Typology Code available for payment source | Admitting: Adult Health

## 2023-07-24 ENCOUNTER — Encounter: Payer: Self-pay | Admitting: Adult Health

## 2023-07-24 ENCOUNTER — Telehealth: Admitting: Adult Health

## 2023-07-24 DIAGNOSIS — F339 Major depressive disorder, recurrent, unspecified: Secondary | ICD-10-CM

## 2023-07-24 DIAGNOSIS — G4700 Insomnia, unspecified: Secondary | ICD-10-CM

## 2023-07-24 DIAGNOSIS — F3341 Major depressive disorder, recurrent, in partial remission: Secondary | ICD-10-CM

## 2023-07-24 DIAGNOSIS — F29 Unspecified psychosis not due to a substance or known physiological condition: Secondary | ICD-10-CM | POA: Diagnosis not present

## 2023-07-24 MED ORDER — BUPROPION HCL ER (XL) 300 MG PO TB24: 300.0000 mg | ORAL_TABLET | Freq: Every day | ORAL | 2 refills | Status: DC

## 2023-07-24 MED ORDER — BENZTROPINE MESYLATE 0.5 MG PO TABS
0.5000 mg | ORAL_TABLET | Freq: Every evening | ORAL | 1 refills | Status: DC | PRN
Start: 2023-07-24 — End: 2023-12-04

## 2023-07-24 MED ORDER — ESZOPICLONE 2 MG PO TABS: 2.0000 mg | ORAL_TABLET | Freq: Every day | ORAL | 2 refills | Status: DC

## 2023-07-24 NOTE — Progress Notes (Signed)
 Tonya Watkins [id number] 1970/08/11 53 y.o.  Virtual Visit via Video Note  I connected with pt @ on 07/24/23 at  5:00 PM EDT by a video enabled telemedicine application and verified that I am speaking with the correct person using two identifiers.   I discussed the limitations of evaluation and management by telemedicine and the availability of in person appointments. The patient expressed understanding and agreed to proceed.  I discussed the assessment and treatment plan with the patient. The patient was provided an opportunity to ask questions and all were answered. The patient agreed with the plan and demonstrated an understanding of the instructions.   The patient was advised to call back or seek an in-person evaluation if the symptoms worsen or if the condition fails to improve as anticipated.  I provided 15 minutes of non-face-to-face time during this encounter.  The patient was located at home.  The provider was located at [facility].   Dorothyann Gibbs, NP   Subjective:   Patient ID:  Tonya Watkins is a 53 y.o. (DOB 1971/01/23) female.  Chief Complaint: No chief complaint on file.   HPI Tonya Watkins presents for follow-up of MDD, psychosis and insomnia.  Describes mood today as "ok". Pleasant. Denies tearfulness. Mood symptoms - reports depression and anxiety - "sometimes". Reports lower interest and motivation. Denies irritability. Reports occasional panic attacks. Reports some worry, rumination and over thinking - "sometimes". Denies obsessive thoughts and acts. Reports hearing a man's voice - "not as loud as it was before". Mood is "pretty stable". Stating "I feel like I'm making progress". Reports medication changes have been helpful. Taking medications as prescribed. Energy levels "lower". Active, does not have a regular exercise routine.  Enjoys some usual interests and activities. Divorced. Dating - long distance relationship. Has 3 children at home.  Spending  time with family. Appetite adequate. Weight stable - 143 pounds. Sleeping well most nights. Averages 6 to 8 hours.  Reports focus and concentration improved. Completing tasks. Managing aspects of household. 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 Reports VH - hearing a female voice - "not as loud as it was before" Denies AH. Denies self harm. Denies substance use.  Previous medication trials:  Wellbutrin XL 300mg  for years up until hospitalization.   Review of Systems:  Review of Systems  Musculoskeletal:  Negative for gait problem.  Neurological:  Negative for tremors.  Psychiatric/Behavioral:         Please refer to HPI    Medications: I have reviewed the patient's current medications.  Current Outpatient Medications  Medication Sig Dispense Refill   ARIPiprazole (ABILIFY) 15 MG tablet Take 1 tablet (15 mg total) by mouth every morning. 30 tablet 2   benztropine (COGENTIN) 0.5 MG tablet Take 1 tablet (0.5 mg total) by mouth at bedtime as needed for tremors. 90 tablet 1   buPROPion (WELLBUTRIN XL) 300 MG 24 hr tablet Take 1 tablet (300 mg total) by mouth daily. 30 tablet 2   eszopiclone (LUNESTA) 2 MG TABS tablet Take 1 tablet (2 mg total) by mouth at bedtime. Take immediately before bedtime 30 tablet 2   Melatonin 10 MG TABS Take by mouth.     nitrofurantoin, macrocrystal-monohydrate, (MACROBID) 100 MG capsule Take 1 capsule (100 mg total) by mouth 2 (two) times daily. 10 capsule 0   risperidone (RISPERDAL) 4 MG tablet Take 1 tablet (4 mg total) by mouth at bedtime. 30 tablet 2   valACYclovir (VALTREX) 500 MG  tablet Take 1 tablet (500 mg total) by mouth daily. Annual appt is due must see provider for future refills 30 tablet 0   No current facility-administered medications for this visit.    Medication Side Effects: None  Allergies:  Allergies  Allergen Reactions   Mirtazapine     Weight gain    Past Medical History:  Diagnosis Date    Depression    DVT (deep venous thrombosis) (HCC)    left calf   Heart palpitations    Medical history non-contributory     Family History  Problem Relation Age of Onset   Allergies Mother    Cancer Mother        thyroid   Heart block Maternal Grandmother    Mental illness Neg Hx     Social History   Socioeconomic History   Marital status: Divorced    Spouse name: Not on file   Number of children: 3   Years of education: Not on file   Highest education level: Associate degree: occupational, Scientist, product/process development, or vocational program  Occupational History   Occupation: [employer]  Tobacco Use   Smoking status: Never   Smokeless tobacco: Never  Vaping Use   Vaping status: Never Used  Substance and Sexual Activity   Alcohol use: Not Currently   Drug use: Not Currently   Sexual activity: Not Currently  Other Topics Concern   Not on file  Social History Narrative   Not on file   Social Drivers of Health   Financial Resource Strain: Not on file  Food Insecurity: Not on file  Transportation Needs: Not on file  Physical Activity: Not on file  Stress: Not on file  Social Connections: Not on file  Intimate Partner Violence: Not on file    Past Medical History, Surgical history, Social history, and Family history were reviewed and updated as appropriate.   Please see review of systems for further details on the patient's review from today.   Objective:   Physical Exam:  There were no vitals taken for this visit.  Physical Exam Constitutional:      General: She is not in acute distress. Musculoskeletal:        General: No deformity.  Neurological:     Mental Status: She is alert and oriented to person, place, and time.     Coordination: Coordination normal.  Psychiatric:        Attention and Perception: Attention and perception normal. She does not perceive auditory or visual hallucinations.        Mood and Affect: Affect is not labile, blunt, angry or inappropriate.         Speech: Speech normal.        Behavior: Behavior normal.        Thought Content: Thought content normal. Thought content is not paranoid or delusional. Thought content does not include homicidal or suicidal ideation. Thought content does not include homicidal or suicidal plan.        Cognition and Memory: Cognition and memory normal.        Judgment: Judgment normal.     Comments: Insight intact     Lab Review:     Component Value Date/Time   NA 140 03/11/2023 1407   K 3.6 03/11/2023 1407   CL 103 03/11/2023 1407   CO2 31 03/11/2023 1407   GLUCOSE 91 03/11/2023 1407   BUN 10 03/11/2023 1407   CREATININE 0.74 03/11/2023 1407   CREATININE 0.82 12/16/2019 1614   CALCIUM  10.0 03/11/2023 1407   PROT 7.7 03/11/2023 1407   ALBUMIN 4.6 03/11/2023 1407   AST 24 03/11/2023 1407   ALT 35 03/11/2023 1407   ALKPHOS 47 03/11/2023 1407   BILITOT 0.5 03/11/2023 1407   GFRNONAA >60 06/06/2021 1150   GFRNONAA 85 12/16/2019 1614   GFRAA 98 12/16/2019 1614       Component Value Date/Time   WBC 4.9 03/11/2023 1407   RBC 4.02 03/11/2023 1407   HGB 12.8 03/11/2023 1407   HCT 39.2 03/11/2023 1407   PLT 257.0 03/11/2023 1407   MCV 97.6 03/11/2023 1407   MCH 31.3 06/06/2021 1150   MCHC 32.7 03/11/2023 1407   RDW 12.2 03/11/2023 1407   LYMPHSABS 1.3 03/11/2023 1407   MONOABS 0.4 03/11/2023 1407   EOSABS 0.1 03/11/2023 1407   BASOSABS 0.0 03/11/2023 1407    No results found for: "POCLITH", "LITHIUM"   No results found for: "PHENYTOIN", "PHENOBARB", "VALPROATE", "CBMZ"   .res Assessment: Plan:    Plan:  PDMP reviewed  Abilify 15 daily for psychosis. Decrease Risperidone - 4mg  to 2 mg at bedtime for 2 weeks and then d/c. Wellbutrin XL 300 mg daily Benztropine 0.5 mg at bedtime - denies akathesia or TD Lunesta 2mg  at hs  Melatonin 10 mg p.o. nightly as needed - uses every night  EKG - 04/25 Sinus Brady - HR 57   RTC 4 weeks  15 minutes spent dedicated to the care of this  patient on the date of this encounter to include pre-visit review of records, ordering of medication, post visit documentation, and face-to-face time with the patient discussing MDD, psychosis and insomnia. Discussed continuing current medication regimen.  Patient advised to contact office with any questions, adverse effects, or acute worsening in signs and symptoms.   Discussed potential metabolic side effects associated with atypical antipsychotics, as well as potential risk for movement side effects. Advised pt to contact office if movement side effects occur.    There are no diagnoses linked to this encounter.   Please see After Visit Summary for patient specific instructions.  Future Appointments  Date Time Provider Department Center  07/24/2023  5:00 PM Mckinna Demars, Thereasa Solo, NP [facility] None  09/09/2023  3:40 PM Plotnikov, Georgina Quint, MD [facility] None    No orders of the defined types were placed in this encounter.     -------------------------------

## 2023-08-17 ENCOUNTER — Other Ambulatory Visit: Payer: Self-pay | Admitting: Adult Health

## 2023-08-17 DIAGNOSIS — F29 Unspecified psychosis not due to a substance or known physiological condition: Secondary | ICD-10-CM

## 2023-08-22 ENCOUNTER — Telehealth: Payer: Self-pay | Admitting: Adult Health

## 2023-08-22 DIAGNOSIS — F29 Unspecified psychosis not due to a substance or known physiological condition: Secondary | ICD-10-CM

## 2023-08-22 MED ORDER — ARIPIPRAZOLE 15 MG PO TABS: 15.0000 mg | ORAL_TABLET | Freq: Every morning | ORAL | 2 refills | Status: DC

## 2023-08-22 NOTE — Telephone Encounter (Signed)
 Patient called about her medications. States that her Abilify is the incorrect dosage and so is the Risperdal. Could someone call her at [phone] to discuss these two medications?

## 2023-08-22 NOTE — Telephone Encounter (Signed)
 From March visit:   Abilify 15 daily for psychosis. Decrease Risperidone - 4mg  to 2 mg at bedtime for 2 weeks and then d/c.  Abilify 15 mg Rx was sent but pharmacy filled 2 mg. Resent Abilify Rx with notation to discontinue any other dosing. Pt did not remember she was supposed to taper and discontinue the Risperidone.

## 2023-08-25 NOTE — Telephone Encounter (Signed)
 Tried submitting prior authorization for Aripiprazole 15 mg with [employer], response was for patient to contact back of her card. ID# [id number]

## 2023-08-27 ENCOUNTER — Other Ambulatory Visit: Payer: Self-pay | Admitting: Adult Health

## 2023-08-27 DIAGNOSIS — G4700 Insomnia, unspecified: Secondary | ICD-10-CM

## 2023-09-09 ENCOUNTER — Ambulatory Visit: Payer: No Typology Code available for payment source | Admitting: Internal Medicine

## 2023-11-12 ENCOUNTER — Other Ambulatory Visit: Payer: Self-pay | Admitting: Adult Health

## 2023-11-12 DIAGNOSIS — F29 Unspecified psychosis not due to a substance or known physiological condition: Secondary | ICD-10-CM

## 2023-11-16 NOTE — Telephone Encounter (Signed)
 Please call to schedule FU, was due in April.

## 2023-11-18 NOTE — Telephone Encounter (Signed)
/  Appt 7/24

## 2023-12-04 ENCOUNTER — Telehealth: Admitting: Adult Health

## 2023-12-04 ENCOUNTER — Encounter: Payer: Self-pay | Admitting: Adult Health

## 2023-12-04 DIAGNOSIS — F3341 Major depressive disorder, recurrent, in partial remission: Secondary | ICD-10-CM | POA: Diagnosis not present

## 2023-12-04 DIAGNOSIS — F29 Unspecified psychosis not due to a substance or known physiological condition: Secondary | ICD-10-CM | POA: Diagnosis not present

## 2023-12-04 DIAGNOSIS — G4700 Insomnia, unspecified: Secondary | ICD-10-CM | POA: Diagnosis not present

## 2023-12-04 MED ORDER — ESZOPICLONE 2 MG PO TABS: 2.0000 mg | ORAL_TABLET | Freq: Every day | ORAL | 2 refills | Status: DC

## 2023-12-04 MED ORDER — BENZTROPINE MESYLATE 0.5 MG PO TABS: 0.5000 mg | ORAL_TABLET | Freq: Every evening | ORAL | 1 refills | Status: DC | PRN

## 2023-12-04 MED ORDER — BUPROPION HCL ER (XL) 300 MG PO TB24: 300.0000 mg | ORAL_TABLET | Freq: Every day | ORAL | 1 refills | Status: DC

## 2023-12-04 MED ORDER — ARIPIPRAZOLE 15 MG PO TABS: 15.0000 mg | ORAL_TABLET | Freq: Every morning | ORAL | 1 refills | Status: DC

## 2023-12-04 NOTE — Progress Notes (Signed)
 Tonya Watkins [id number] [birth year] 53 y.o.  Virtual Visit via Video Note  I connected with pt @ on 12/04/23 at  5:00 PM EDT by a video enabled telemedicine application and verified that I am speaking with the correct person using two identifiers.   I discussed the limitations of evaluation and management by telemedicine and the availability of in person appointments. The patient expressed understanding and agreed to proceed.  I discussed the assessment and treatment plan with the patient. The patient was provided an opportunity to ask questions and all were answered. The patient agreed with the plan and demonstrated an understanding of the instructions.   The patient was advised to call back or seek an in-person evaluation if the symptoms worsen or if the condition fails to improve as anticipated.  I provided 25 minutes of non-face-to-face time during this encounter.  The patient was located at home.  The provider was located at [facility].   [name], NP   Subjective:   Patient ID:  Tonya Watkins is a 53 y.o. (DOB Jul 11, 1970) female.  Chief Complaint: No chief complaint on file.   HPI Stephaniemarie Burbach presents for follow-up of MDD, psychosis and insomnia.  Describes mood today as ok. Pleasant. Denies tearfulness. Mood symptoms - denies depression, anxiety and irritability. Reports stable interest and motivation. Denies panic attacks. Denies worry, rumination and over thinking. Denies obsessive thoughts and acts. Reports hearing a man's voice - still happening, but definitely better. Report mood is stable. Stating I feel like I'm doing very well. Reports medication changes have been helpful. Taking medications as prescribed. Energy levels stable. Active, does not have a regular exercise routine.  Enjoys some usual interests and activities. Divorced. Single. Has 3 children at home. Spending time with family. Appetite adequate. Weight stable - 140 from 143  pounds. Sleeping well most nights. Averages 6 to 8 hours.  Reports focus and concentration stable. Completing tasks. Managing aspects of household. 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 Reports VH - hearing a female voice - not as bad as it used to be Denies AH. Denies self harm. Denies substance use.  Previous medication trials:  Wellbutrin  XL 300mg  for years up until hospitalization.    Review of Systems:  Review of Systems  Musculoskeletal:  Negative for gait problem.  Neurological:  Negative for tremors.  Psychiatric/Behavioral:         Please refer to HPI    Medications: I have reviewed the patient's current medications.  Current Outpatient Medications  Medication Sig Dispense Refill   ARIPiprazole  (ABILIFY ) 15 MG tablet Take 1 tablet (15 mg total) by mouth every morning. 90 tablet 1   benztropine  (COGENTIN ) 0.5 MG tablet Take 1 tablet (0.5 mg total) by mouth at bedtime as needed for tremors. 90 tablet 1   buPROPion  (WELLBUTRIN  XL) 300 MG 24 hr tablet Take 1 tablet (300 mg total) by mouth daily. 90 tablet 1   eszopiclone  (LUNESTA ) 2 MG TABS tablet Take 1 tablet (2 mg total) by mouth at bedtime. Take immediately before bedtime 30 tablet 2   Melatonin 10 MG TABS Take by mouth.     nitrofurantoin , macrocrystal-monohydrate, (MACROBID ) 100 MG capsule Take 1 capsule (100 mg total) by mouth 2 (two) times daily. 10 capsule 0   valACYclovir  (VALTREX ) 500 MG tablet Take 1 tablet (500 mg total) by mouth daily. Annual appt is due must see provider for future refills 30 tablet 0   No current facility-administered medications  for this visit.    Medication Side Effects: None  Allergies:  Allergies  Allergen Reactions   Mirtazapine      Weight gain    Past Medical History:  Diagnosis Date   Depression    DVT (deep venous thrombosis) (HCC)    left calf   Heart palpitations    Medical history non-contributory     Family History  Problem Relation Age  of Onset   Allergies Mother    Cancer Mother        thyroid    Heart block Maternal Grandmother    Mental illness Neg Hx     Social History   Socioeconomic History   Marital status: Divorced    Spouse name: Not on file   Number of children: 3   Years of education: Not on file   Highest education level: Associate degree: occupational, Scientist, product/process development, or vocational program  Occupational History   Occupation: [employer]  Tobacco Use   Smoking status: Never   Smokeless tobacco: Never  Vaping Use   Vaping status: Never Used  Substance and Sexual Activity   Alcohol use: Not Currently   Drug use: Not Currently   Sexual activity: Not Currently  Other Topics Concern   Not on file  Social History Narrative   Not on file   Social Drivers of Health   Financial Resource Strain: Not on file  Food Insecurity: Not on file  Transportation Needs: Not on file  Physical Activity: Not on file  Stress: Not on file  Social Connections: Not on file  Intimate Partner Violence: Not on file    Past Medical History, Surgical history, Social history, and Family history were reviewed and updated as appropriate.   Please see review of systems for further details on the patient's review from today.   Objective:   Physical Exam:  There were no vitals taken for this visit.  Physical Exam Constitutional:      General: She is not in acute distress. Musculoskeletal:        General: No deformity.  Neurological:     Mental Status: She is alert and oriented to person, place, and time.     Coordination: Coordination normal.  Psychiatric:        Attention and Perception: Attention and perception normal. She does not perceive auditory or visual hallucinations.        Mood and Affect: Mood normal. Mood is not anxious or depressed. Affect is not labile, blunt, angry or inappropriate.        Speech: Speech normal.        Behavior: Behavior normal.        Thought Content: Thought content normal. Thought content  is not paranoid or delusional. Thought content does not include homicidal or suicidal ideation. Thought content does not include homicidal or suicidal plan.        Cognition and Memory: Cognition and memory normal.        Judgment: Judgment normal.     Comments: Insight intact     Lab Review:     Component Value Date/Time   NA 140 03/11/2023 1407   K 3.6 03/11/2023 1407   CL 103 03/11/2023 1407   CO2 31 03/11/2023 1407   GLUCOSE 91 03/11/2023 1407   BUN 10 03/11/2023 1407   CREATININE 0.74 03/11/2023 1407   CREATININE 0.82 12/16/2019 1614   CALCIUM 10.0 03/11/2023 1407   PROT 7.7 03/11/2023 1407   ALBUMIN 4.6 03/11/2023 1407   AST 24 03/11/2023 1407  ALT 35 03/11/2023 1407   ALKPHOS 47 03/11/2023 1407   BILITOT 0.5 03/11/2023 1407   GFRNONAA >60 06/06/2021 1150   GFRNONAA 85 12/16/2019 1614   GFRAA 98 12/16/2019 1614       Component Value Date/Time   WBC 4.9 03/11/2023 1407   RBC 4.02 03/11/2023 1407   HGB 12.8 03/11/2023 1407   HCT 39.2 03/11/2023 1407   PLT 257.0 03/11/2023 1407   MCV 97.6 03/11/2023 1407   MCH 31.3 06/06/2021 1150   MCHC 32.7 03/11/2023 1407   RDW 12.2 03/11/2023 1407   LYMPHSABS 1.3 03/11/2023 1407   MONOABS 0.4 03/11/2023 1407   EOSABS 0.1 03/11/2023 1407   BASOSABS 0.0 03/11/2023 1407    No results found for: POCLITH, LITHIUM   No results found for: PHENYTOIN, PHENOBARB, VALPROATE, CBMZ   .res Assessment: Plan:   Plan:  PDMP reviewed  Abilify  15 daily for psychosis. Wellbutrin  XL 300 mg daily Benztropine  0.5 mg at bedtime - denies akathesia or TD Lunesta  2mg  at hs  Melatonin 10 mg p.o. nightly as needed - uses every night  EKG - 04/25 Sinus Brady - HR 57   RTC 3 months  15 minutes spent dedicated to the care of this patient on the date of this encounter to include pre-visit review of records, ordering of medication, post visit documentation, and face-to-face time with the patient discussing MDD, psychosis and  insomnia. Discussed continuing current medication regimen.  Patient advised to contact office with any questions, adverse effects, or acute worsening in signs and symptoms.   Discussed potential metabolic side effects associated with atypical antipsychotics, as well as potential risk for movement side effects. Advised pt to contact office if movement side effects occur.    Diagnoses and all orders for this visit:  MDD (major depressive disorder), recurrent, in partial remission (HCC)  Psychosis, unspecified psychosis type (HCC) -     buPROPion  (WELLBUTRIN  XL) 300 MG 24 hr tablet; Take 1 tablet (300 mg total) by mouth daily. -     benztropine  (COGENTIN ) 0.5 MG tablet; Take 1 tablet (0.5 mg total) by mouth at bedtime as needed for tremors. -     ARIPiprazole  (ABILIFY ) 15 MG tablet; Take 1 tablet (15 mg total) by mouth every morning.  Insomnia, unspecified type -     eszopiclone  (LUNESTA ) 2 MG TABS tablet; Take 1 tablet (2 mg total) by mouth at bedtime. Take immediately before bedtime     Please see After Visit Summary for patient specific instructions.  No future appointments.   No orders of the defined types were placed in this encounter.     -------------------------------

## 2024-05-18 ENCOUNTER — Telehealth: Admitting: Family Medicine

## 2024-05-18 DIAGNOSIS — R3989 Other symptoms and signs involving the genitourinary system: Secondary | ICD-10-CM

## 2024-05-18 MED ORDER — CEPHALEXIN 500 MG PO CAPS: 500.0000 mg | ORAL_CAPSULE | Freq: Two times a day (BID) | ORAL | 0 refills | Status: AC

## 2024-05-18 NOTE — Progress Notes (Signed)

## 2024-06-01 ENCOUNTER — Encounter: Payer: Self-pay | Admitting: Internal Medicine

## 2024-06-02 ENCOUNTER — Ambulatory Visit: Admitting: Internal Medicine

## 2024-06-02 NOTE — Progress Notes (Unsigned)
" ° ° °  Subjective:    Patient ID: Tonya Watkins, female    DOB: January 22, 1971, 54 y.o.   MRN: [id number]      HPI Tonya Watkins is here for No chief complaint on file.   05/18/2024-e-visit for suspected UTI.  Prescribed Keflex  500 mg twice daily x 7 days.  06/01/2024-MyChart message-did not feel the symptoms had resolved     Medications and allergies reviewed with patient and updated if appropriate.  Medications Ordered Prior to Encounter[1]  Review of Systems See HPI    Objective:  There were no vitals filed for this visit. BP Readings from Last 3 Encounters:  03/11/23 110/70  09/05/22 128/82  06/20/21 116/74   Wt Readings from Last 3 Encounters:  03/11/23 150 lb (68 kg)  09/05/22 152 lb 4.8 oz (69.1 kg)  06/20/21 153 lb 6.4 oz (69.6 kg)   There is no height or weight on file to calculate BMI.    Physical Exam Constitutional:      General: She is not in acute distress.    Appearance: Normal appearance. She is not ill-appearing.  HENT:     Head: Normocephalic.  Eyes:     Conjunctiva/sclera: Conjunctivae normal.  Abdominal:     General: There is no distension.     Palpations: Abdomen is soft.     Tenderness: There is no abdominal tenderness. There is no right CVA tenderness, left CVA tenderness, guarding or rebound.  Skin:    General: Skin is warm and dry.  Neurological:     Mental Status: She is alert.            Assessment & Plan:    See Problem List for Assessment and Plan of chronic medical problems.         [1]  Current Outpatient Medications on File Prior to Visit  Medication Sig Dispense Refill   ARIPiprazole  (ABILIFY ) 15 MG tablet Take 1 tablet (15 mg total) by mouth every morning. 90 tablet 1   benztropine  (COGENTIN ) 0.5 MG tablet Take 1 tablet (0.5 mg total) by mouth at bedtime as needed for tremors. 90 tablet 1   buPROPion  (WELLBUTRIN  XL) 300 MG 24 hr tablet Take 1 tablet (300 mg total) by mouth daily. 90 tablet 1   eszopiclone  (LUNESTA ) 2  MG TABS tablet Take 1 tablet (2 mg total) by mouth at bedtime. Take immediately before bedtime 30 tablet 2   Melatonin 10 MG TABS Take by mouth.     nitrofurantoin , macrocrystal-monohydrate, (MACROBID ) 100 MG capsule Take 1 capsule (100 mg total) by mouth 2 (two) times daily. 10 capsule 0   valACYclovir  (VALTREX ) 500 MG tablet Take 1 tablet (500 mg total) by mouth daily. Annual appt is due must see provider for future refills 30 tablet 0   No current facility-administered medications on file prior to visit.   "

## 2024-06-02 NOTE — Patient Instructions (Addendum)
        See your chiropractor and let me know.     Return if symptoms worsen or fail to improve.

## 2024-06-03 ENCOUNTER — Encounter: Payer: Self-pay | Admitting: Internal Medicine

## 2024-06-03 ENCOUNTER — Ambulatory Visit: Admitting: Internal Medicine

## 2024-06-03 ENCOUNTER — Other Ambulatory Visit: Payer: Self-pay | Admitting: Adult Health

## 2024-06-03 VITALS — BP 104/72 | HR 58 | Temp 98.7°F | Ht 63.0 in | Wt 153.0 lb

## 2024-06-03 DIAGNOSIS — R300 Dysuria: Secondary | ICD-10-CM

## 2024-06-03 DIAGNOSIS — G4700 Insomnia, unspecified: Secondary | ICD-10-CM

## 2024-06-03 DIAGNOSIS — R102 Pelvic and perineal pain unspecified side: Secondary | ICD-10-CM | POA: Diagnosis not present

## 2024-06-03 DIAGNOSIS — Z23 Encounter for immunization: Secondary | ICD-10-CM | POA: Diagnosis not present

## 2024-06-03 LAB — POC URINALSYSI DIPSTICK (AUTOMATED)
Bilirubin, UA: NEGATIVE
Blood, UA: NEGATIVE
Glucose, UA: NEGATIVE
Ketones, UA: NEGATIVE
Leukocytes, UA: NEGATIVE
Nitrite, UA: NEGATIVE
Protein, UA: NEGATIVE
Spec Grav, UA: 1.015
Urobilinogen, UA: 0.2 U/dL
pH, UA: 6

## 2024-06-03 NOTE — Addendum Note (Signed)
 Addended by: CLAUDENE TOBIAS PARAS on: 06/03/2024 04:47 PM   Modules accepted: Orders

## 2024-06-04 ENCOUNTER — Telehealth: Payer: Self-pay | Admitting: Adult Health

## 2024-06-04 NOTE — Telephone Encounter (Signed)
 Next visit is 06/07/24. Requesting refill on Es zopiclone  2 mg.called to:  [id number] [place], [place] - [address]   Phone: [phone]  Fax: [phone]

## 2024-06-04 NOTE — Telephone Encounter (Signed)
 Addressed thru pharmacy interface.

## 2024-06-05 LAB — CULTURE, URINE COMPREHENSIVE: RESULT:: NO GROWTH

## 2024-06-06 ENCOUNTER — Ambulatory Visit: Payer: Self-pay | Admitting: Internal Medicine

## 2024-06-07 ENCOUNTER — Encounter: Payer: Self-pay | Admitting: Adult Health

## 2024-06-07 ENCOUNTER — Telehealth: Admitting: Adult Health

## 2024-06-07 DIAGNOSIS — F29 Unspecified psychosis not due to a substance or known physiological condition: Secondary | ICD-10-CM | POA: Diagnosis not present

## 2024-06-07 DIAGNOSIS — G4700 Insomnia, unspecified: Secondary | ICD-10-CM | POA: Diagnosis not present

## 2024-06-07 DIAGNOSIS — F3341 Major depressive disorder, recurrent, in partial remission: Secondary | ICD-10-CM

## 2024-06-07 MED ORDER — ESZOPICLONE 2 MG PO TABS: 2.0000 mg | ORAL_TABLET | Freq: Every day | ORAL | 2 refills | Status: AC

## 2024-06-07 MED ORDER — BUPROPION HCL ER (XL) 300 MG PO TB24: 300.0000 mg | ORAL_TABLET | Freq: Every day | ORAL | 1 refills | Status: AC

## 2024-06-07 NOTE — Progress Notes (Signed)
 Tonya Watkins [id number] 04/17/1971 54 y.o.  Virtual Visit via Video Note  I connected with pt @ on 06/07/24 at  5:00 PM EST by a video enabled telemedicine application and verified that I am speaking with the correct person using two identifiers.   I discussed the limitations of evaluation and management by telemedicine and the availability of in person appointments. The patient expressed understanding and agreed to proceed.  I discussed the assessment and treatment plan with the patient. The patient was provided an opportunity to ask questions and all were answered. The patient agreed with the plan and demonstrated an understanding of the instructions.   The patient was advised to call back or seek an in-person evaluation if the symptoms worsen or if the condition fails to improve as anticipated.  I provided 15 minutes of non-face-to-face time during this encounter.  The patient was located at home.  The provider was located at [facility].   [name], NP   Subjective:   Patient ID:  Tonya Watkins is a 54 y.o. (DOB Mar 18, 1971) female.  Chief Complaint: No chief complaint on file.   HPI Tonya Watkins presents for follow-up of MDD, psychosis and insomnia.  Describes mood today as ok. Pleasant. Denies tearfulness. Mood symptoms - denies depression, anxiety and irritability. Reports stable interest and motivation. Denies panic attacks. Denies worry and rumination. Reports some over thinking. Denies obsessive thoughts and acts. Reports hearing a man's voice - not every day. Report mood is stable. Stating I feel like I'm doing good. Reports medications are helpful. Taking medications as prescribed. Energy levels stable. Active, does not have a regular exercise routine.  Enjoys some usual interests and activities. Divorced. Single. Has 3 children at home. Spending time with family. Appetite adequate. Weight gain - 140 to 155 pounds - 63. Sleeping well most nights.  Averages 6 to 8 hours.  Reports focus and concentration stable. Completing tasks. Managing aspects of household. 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 Reports VH - hearing a female voice - every now and then. Denies AH. Denies self harm. Denies substance use.  Previous medication trials:  Wellbutrin  XL 300mg  for years up until hospitalization.   Review of Systems:  Review of Systems  Musculoskeletal:  Negative for gait problem.  Neurological:  Negative for tremors.  Psychiatric/Behavioral:         Please refer to HPI    Medications: I have reviewed the patient's current medications.  Current Outpatient Medications  Medication Sig Dispense Refill   ARIPiprazole  (ABILIFY ) 15 MG tablet Take 15 mg by mouth every morning.     benztropine  (COGENTIN ) 0.5 MG tablet Take 0.5 mg by mouth daily.     buPROPion  (WELLBUTRIN  XL) 300 MG 24 hr tablet Take 1 tablet (300 mg total) by mouth daily. 90 tablet 1   eszopiclone  (LUNESTA ) 2 MG TABS tablet TAKE 1 TABLET BY MOUTH DAILY IMMEDIATELY BEFORE BEDTIME 30 tablet 0   valACYclovir  (VALTREX ) 500 MG tablet Take 1 tablet (500 mg total) by mouth daily. Annual appt is due must see provider for future refills 30 tablet 0   No current facility-administered medications for this visit.    Medication Side Effects: None  Allergies: Allergies[1]  Past Medical History:  Diagnosis Date   Depression    DVT (deep venous thrombosis) (HCC)    left calf   Heart palpitations    Medical history non-contributory     Family History  Problem Relation Age of Onset  Allergies Mother    Cancer Mother        thyroid    Heart block Maternal Grandmother    Mental illness Neg Hx     Social History   Socioeconomic History   Marital status: Divorced    Spouse name: Not on file   Number of children: 3   Years of education: Not on file   Highest education level: Associate degree: occupational, scientist, product/process development, or vocational program   Occupational History   Occupation: [employer]  Tobacco Use   Smoking status: Never   Smokeless tobacco: Never  Vaping Use   Vaping status: Never Used  Substance and Sexual Activity   Alcohol use: Not Currently   Drug use: Not Currently   Sexual activity: Not Currently  Other Topics Concern   Not on file  Social History Narrative   Not on file   Social Drivers of Health   Tobacco Use: Low Risk (06/07/2024)   Patient History    Smoking Tobacco Use: Never    Smokeless Tobacco Use: Never    Passive Exposure: Not on file  Financial Resource Strain: Not on file  Food Insecurity: Not on file  Transportation Needs: Not on file  Physical Activity: Not on file  Stress: Not on file  Social Connections: Not on file  Intimate Partner Violence: Not on file  Depression (PHQ2-9): Low Risk (06/03/2024)   Depression (PHQ2-9)    PHQ-2 Score: 0  Alcohol Screen: Low Risk (06/07/2021)   Alcohol Screen    Last Alcohol Screening Score (AUDIT): 0  Housing: Not on file  Utilities: Not on file  Health Literacy: Not on file    Past Medical History, Surgical history, Social history, and Family history were reviewed and updated as appropriate.   Please see review of systems for further details on the patient's review from today.   Objective:   Physical Exam:  There were no vitals taken for this visit.  Physical Exam Constitutional:      General: She is not in acute distress. Musculoskeletal:        General: No deformity.  Neurological:     Mental Status: She is alert and oriented to person, place, and time.     Coordination: Coordination normal.  Psychiatric:        Attention and Perception: Attention and perception normal. She does not perceive auditory or visual hallucinations.        Mood and Affect: Mood normal. Mood is not anxious or depressed. Affect is not labile, blunt, angry or inappropriate.        Speech: Speech normal.        Behavior: Behavior normal.        Thought Content:  Thought content normal. Thought content is not paranoid or delusional. Thought content does not include homicidal or suicidal ideation. Thought content does not include homicidal or suicidal plan.        Cognition and Memory: Cognition and memory normal.        Judgment: Judgment normal.     Comments: Insight intact     Lab Review:     Component Value Date/Time   NA 140 03/11/2023 1407   K 3.6 03/11/2023 1407   CL 103 03/11/2023 1407   CO2 31 03/11/2023 1407   GLUCOSE 91 03/11/2023 1407   BUN 10 03/11/2023 1407   CREATININE 0.74 03/11/2023 1407   CREATININE 0.82 12/16/2019 1614   CALCIUM 10.0 03/11/2023 1407   PROT 7.7 03/11/2023 1407   ALBUMIN 4.6  03/11/2023 1407   AST 24 03/11/2023 1407   ALT 35 03/11/2023 1407   ALKPHOS 47 03/11/2023 1407   BILITOT 0.5 03/11/2023 1407   GFRNONAA >60 06/06/2021 1150   GFRNONAA 85 12/16/2019 1614   GFRAA 98 12/16/2019 1614       Component Value Date/Time   WBC 4.9 03/11/2023 1407   RBC 4.02 03/11/2023 1407   HGB 12.8 03/11/2023 1407   HCT 39.2 03/11/2023 1407   PLT 257.0 03/11/2023 1407   MCV 97.6 03/11/2023 1407   MCH 31.3 06/06/2021 1150   MCHC 32.7 03/11/2023 1407   RDW 12.2 03/11/2023 1407   LYMPHSABS 1.3 03/11/2023 1407   MONOABS 0.4 03/11/2023 1407   EOSABS 0.1 03/11/2023 1407   BASOSABS 0.0 03/11/2023 1407    No results found for: POCLITH, LITHIUM   No results found for: PHENYTOIN, PHENOBARB, VALPROATE, CBMZ   .res Assessment: Plan:    Plan:  PDMP reviewed  D/C Benztropine  0.5 mg at bedtime - denies akathesia or TD D/C Abilify  15 daily for psychosis.  Wellbutrin  XL 300 mg daily Lunesta  2mg  at hs  Melatonin 10 mg p.o. nightly as needed - uses every night  EKG - 04/25 Sinus Brady - HR 57   RTC 6 months  15 minutes spent dedicated to the care of this patient on the date of this encounter to include pre-visit review of records, ordering of medication, post visit documentation, and face-to-face time  with the patient discussing MDD, psychosis and insomnia. Discussed continuing current medication regimen.  Patient advised to contact office with any questions, adverse effects, or acute worsening in signs and symptoms.   Discussed potential metabolic side effects associated with atypical antipsychotics, as well as potential risk for movement side effects. Advised pt to contact office if movement side effects occur.    Diagnoses and all orders for this visit:  Psychosis, unspecified psychosis type (HCC)  Insomnia, unspecified type  MDD (major depressive disorder), recurrent, in partial remission     Please see After Visit Summary for patient specific instructions.  Future Appointments  Date Time Provider Department Center  06/30/2024  8:50 AM Plotnikov, Karlynn GAILS, MD [facility]    No orders of the defined types were placed in this encounter.     -------------------------------     [1]  Allergies Allergen Reactions   Mirtazapine      Weight gain

## 2024-06-30 ENCOUNTER — Encounter: Admitting: Internal Medicine
